# Patient Record
Sex: Male | Born: 1935 | Race: Black or African American | Hispanic: No | Marital: Married | Smoking: Former smoker
Health system: Southern US, Community
[De-identification: ages and names within clinical notes are randomized; demographics above are authoritative.]

## PROBLEM LIST (undated history)

## (undated) DIAGNOSIS — I10 Essential (primary) hypertension: Secondary | ICD-10-CM

## (undated) DIAGNOSIS — E43 Unspecified severe protein-calorie malnutrition: Secondary | ICD-10-CM

## (undated) DIAGNOSIS — R0602 Shortness of breath: Secondary | ICD-10-CM

## (undated) DIAGNOSIS — R001 Bradycardia, unspecified: Secondary | ICD-10-CM

## (undated) DIAGNOSIS — Z87891 Personal history of nicotine dependence: Secondary | ICD-10-CM

## (undated) DIAGNOSIS — I209 Angina pectoris, unspecified: Secondary | ICD-10-CM

## (undated) DIAGNOSIS — Z950 Presence of cardiac pacemaker: Secondary | ICD-10-CM

## (undated) DIAGNOSIS — I509 Heart failure, unspecified: Secondary | ICD-10-CM

## (undated) DIAGNOSIS — I272 Pulmonary hypertension, unspecified: Secondary | ICD-10-CM

## (undated) DIAGNOSIS — I499 Cardiac arrhythmia, unspecified: Secondary | ICD-10-CM

## (undated) HISTORY — PX: CHOLECYSTECTOMY: SHX55

## (undated) HISTORY — PX: JOINT REPLACEMENT: SHX530

## (undated) HISTORY — DX: Personal history of nicotine dependence: Z87.891

## (undated) HISTORY — PX: NECK SURGERY: SHX720

---

## 2010-06-16 HISTORY — PX: PACEMAKER INSERTION: SHX728

## 2011-02-26 ENCOUNTER — Other Ambulatory Visit: Payer: Self-pay | Admitting: Internal Medicine

## 2011-02-27 NOTE — Telephone Encounter (Signed)
RX called in, would not go through electronically  

## 2011-05-25 ENCOUNTER — Emergency Department (INDEPENDENT_AMBULATORY_CARE_PROVIDER_SITE_OTHER): Payer: Medicare Other

## 2011-05-25 ENCOUNTER — Encounter: Payer: Self-pay | Admitting: Emergency Medicine

## 2011-05-25 ENCOUNTER — Other Ambulatory Visit: Payer: Self-pay

## 2011-05-25 ENCOUNTER — Emergency Department (HOSPITAL_BASED_OUTPATIENT_CLINIC_OR_DEPARTMENT_OTHER)
Admission: EM | Admit: 2011-05-25 | Discharge: 2011-05-25 | Discharge status: Against Medical Advice | Payer: Medicare Other | Attending: Emergency Medicine | Admitting: Emergency Medicine

## 2011-05-25 DIAGNOSIS — R079 Chest pain, unspecified: Secondary | ICD-10-CM | POA: Insufficient documentation

## 2011-05-25 DIAGNOSIS — R0602 Shortness of breath: Secondary | ICD-10-CM

## 2011-05-25 DIAGNOSIS — R5381 Other malaise: Secondary | ICD-10-CM | POA: Insufficient documentation

## 2011-05-25 DIAGNOSIS — K7689 Other specified diseases of liver: Secondary | ICD-10-CM

## 2011-05-25 DIAGNOSIS — I2510 Atherosclerotic heart disease of native coronary artery without angina pectoris: Secondary | ICD-10-CM

## 2011-05-25 DIAGNOSIS — J438 Other emphysema: Secondary | ICD-10-CM

## 2011-05-25 DIAGNOSIS — E119 Type 2 diabetes mellitus without complications: Secondary | ICD-10-CM

## 2011-05-25 HISTORY — DX: Essential (primary) hypertension: I10

## 2011-05-25 LAB — BASIC METABOLIC PANEL
BUN: 17 mg/dL (ref 6–23)
CO2: 30 mEq/L (ref 19–32)
Chloride: 104 mEq/L (ref 96–112)
GFR calc non Af Amer: 80 mL/min — ABNORMAL LOW (ref >90)
Glucose, Bld: 87 mg/dL (ref 70–99)
Potassium: 4.6 mEq/L (ref 3.5–5.1)
Sodium: 141 mEq/L (ref 135–145)

## 2011-05-25 LAB — CARDIAC PANEL(CRET KIN+CKTOT+MB+TROPI)
Relative Index: 2 (ref 0.0–2.5)
Total CK: 212 U/L (ref 7–232)
Troponin I: <0.3 ng/mL (ref <0.30)

## 2011-05-25 LAB — CBC
HCT: 37.3 % — ABNORMAL LOW (ref 39.0–52.0)
Hemoglobin: 12.2 g/dL — ABNORMAL LOW (ref 13.0–17.0)
MCHC: 32.7 g/dL (ref 30.0–36.0)
RBC: 4.55 MIL/uL (ref 4.22–5.81)

## 2011-05-25 MED ORDER — ASPIRIN 81 MG PO CHEW
CHEWABLE_TABLET | ORAL | Status: AC
  Administered 2011-05-25: 162 mg
  Filled 2011-05-25: qty 2

## 2011-05-25 MED ORDER — IOHEXOL 350 MG/ML SOLN
80.0000 mL | Freq: Once | INTRAVENOUS | Status: AC | PRN
  Administered 2011-05-25: 80 mL via INTRAVENOUS

## 2011-05-25 MED ORDER — ASPIRIN 325 MG PO TABS
325.0000 mg | ORAL_TABLET | Freq: Once | ORAL | Status: DC
  Filled 2011-05-25: qty 1

## 2011-05-25 NOTE — ED Notes (Signed)
Chest pain x 2 days, with exertional SOB.  Pain non-radiating.  No N/V, no diaphoresis.  Pt states he hasnt had his pacemaker.

## 2011-05-25 NOTE — ED Notes (Signed)
ECG done on arrival and given to EDP for confirmation

## 2011-05-25 NOTE — ED Provider Notes (Signed)
Scribed for Ethelda Chick, MD, the patient was seen in room MH04/MH04 . This chart was scribed by Ellie [name]. This patient's care was started at 7:17 PM.   CSN: [id number] Arrival date & time: 05/25/2011  6:37 PM   First MD Initiated Contact with Patient 05/25/11 1910      Chief Complaint  Patient presents with  . Chest Pain  . Shortness of Breath  . Fatigue    (Consider location/radiation/quality/duration/timing/severity/associated sxs/prior treatment) HPI Frederick [name] is a 77 y.o. male who presents to the Emergency Department complaining of intermittent left chest pain for the past 2 days. Episodes last a few seconds. Pt has many episodes during a day for the past 2 days.  Pain is described as a sharp deep pain not throbbing. Pain does not radiate anywhere. No hx of similar sx. Denies any swelling in legs. Denies history of heart failure. Reports no current chest pain but has had some since coming to ED. Normally does not feel pains in chest.  Additionally Pt says he is SOB on exertion for the past year starting before pacemaker insertion. No recent COPD flare ups.   Does not have a local PCP.  Sees Internal Medicine Dr. Dorene Ar in [place].    Past Medical History  Diagnosis Date  . Hypertension   . Diabetes mellitus   COPD  Past Surgical History  Procedure Date  . Pacemaker insertion 06/2010  . Neck surgery     History reviewed. No pertinent family history.  History  Substance Use Topics  . Smoking status: Not on file  . Smokeless tobacco: Not on file  . Alcohol Use: No    Review of Systems 10 Systems reviewed and are negative for acute change except as noted in the HPI.   Allergies  Review of patient's allergies indicates no known allergies.  Home Medications   Current Outpatient Rx  Name Route Sig Dispense Refill  . ASPIRIN 81 MG PO TABS Oral Take 162 mg by mouth daily.      Marland Kitchen LISINOPRIL 20 MG PO TABS Oral Take 5 mg by mouth daily.      Marland Kitchen VITAMIN  C 500 MG PO TABS Oral Take 500 mg by mouth daily.        BP 173/65  Pulse 65  Temp(Src) 97.6 F (36.4 C) (Oral)  Resp 18  SpO2 99%  Physical Exam  Nursing note and vitals reviewed. Constitutional: He is oriented to person, place, and time. He appears well-developed and well-nourished.  HENT:  Head: Normocephalic and atraumatic.  Eyes: Conjunctivae and EOM are normal.  Neck: Neck supple. No JVD present.  Cardiovascular: Normal rate, regular rhythm and normal heart sounds.   Pulmonary/Chest: He exhibits no tenderness (chest not tender to palpation).       Pacemaker left upper chest wall. No tenderness. No fluctuance.   Abdominal: Soft. There is no tenderness.  Musculoskeletal: Normal range of motion. He exhibits no edema (no peripheral edema).  Neurological: He is alert and oriented to person, place, and time.  Skin: Skin is warm and dry.  Psychiatric: He has a normal mood and affect.   Procedures (including critical care time)  OTHER DATA REVIEWED: Nursing notes, vital signs, and past medical records reviewed.   Date: 05/25/2011  Rate: 69  Rhythm: paced rhythm with occasional PACs  QRS Axis: normal  Intervals: normal  ST/T Wave abnormalities: indeterminate  Conduction Disutrbances:right bundle branch block  Narrative Interpretation:   Old EKG Reviewed: none available  DIAGNOSTIC STUDIES: Oxygen Saturation is 99% on room air, normal by my interpretation.    LABS / RADIOLOGY:  Results for orders placed during the hospital encounter of 05/25/11  CARDIAC PANEL(CRET KIN+CKTOT+MB+TROPI)      Component Value Range   Total CK 212  7 - 232 (U/L)   CK, MB 4.2 (*) 0.3 - 4.0 (ng/mL)   Troponin I <0.30  <0.30 (ng/mL)   Relative Index 2.0  0.0 - 2.5   CBC      Component Value Range   WBC 4.9  4.0 - 10.5 (K/uL)   RBC 4.55  4.22 - 5.81 (MIL/uL)   Hemoglobin 12.2 (*) 13.0 - 17.0 (g/dL)   HCT 16.1 (*) 09.6 - 52.0 (%)   MCV 82.0  78.0 - 100.0 (fL)   MCH 26.8  26.0 - 34.0 (pg)    MCHC 32.7  30.0 - 36.0 (g/dL)   RDW 04.5  40.9 - 81.1 (%)   Platelets 119 (*) 150 - 400 (K/uL)  BASIC METABOLIC PANEL      Component Value Range   Sodium 141  135 - 145 (mEq/L)   Potassium 4.6  3.5 - 5.1 (mEq/L)   Chloride 104  96 - 112 (mEq/L)   CO2 30  19 - 32 (mEq/L)   Glucose, Bld 87  70 - 99 (mg/dL)   BUN 17  6 - 23 (mg/dL)   Creatinine, Ser 9.14  0.50 - 1.35 (mg/dL)   Calcium 9.5  8.4 - 78.2 (mg/dL)   GFR calc non Af Amer 80 (*) >90 (mL/min)   GFR calc Af Amer >90  >90 (mL/min)  D-DIMER, QUANTITATIVE      Component Value Range   D-Dimer, Quant 1.60 (*) 0.00 - 0.48 (ug/mL-FEU)   Dg Chest 2 View  05/25/2011  *RADIOLOGY REPORT*  Clinical Data: Chest pain.  Shortness of breath.  Diabetes.  CHEST - 2 VIEW  Comparison: 05/25/2011 CT scan.  Findings: Emphysema noted along with a dual lead pacer with proximal distal leads project over the right atrium and ventricle, respectively.  No cardiomegaly or edema noted. The lungs appear otherwise clear. No pleural effusion is observed.  Mild thoracic spondylosis noted.  IMPRESSION:  1.  Emphysema. 2.  No acute findings.  Original Report Authenticated By: Dellia Cloud, M.D.   Ct Angio Chest W/cm &/or Wo Cm  05/25/2011  *RADIOLOGY REPORT*  Clinical Data:  Intermittent chest pain.  Chronic shortness of breath.  Elevated D-dimer.  COPD.  Pacemaker placement.  CT ANGIOGRAPHY CHEST WITH CONTRAST  Technique:  Multidetector CT imaging of the chest was performed using the standard protocol during bolus administration of intravenous contrast.  Multiplanar CT image reconstructions including MIPs were obtained to evaluate the vascular anatomy.  Contrast: 80mL OMNIPAQUE IOHEXOL 350 MG/ML IV SOLN  Comparison:  Chest radiograph of 05/25/2011  Findings:  Prominent collateralization of the injection bolus may indicate slight stenosis of the left subclavian vein  No filling defect is identified in the pulmonary arterial tree to suggest pulmonary embolus.   There is evidence of mild left coronary artery atherosclerotic calcification.  No pathologic thoracic adenopathy is observed.  The technologist scanned part of the upper abdomen, revealing a 1.9 cm hypodense lesion in the right hepatic lobe with enhancing margins in a technically nonspecific but abnormal fashion.  Arthropathy of the right sternoclavicular joint is noted.  There is mild biapical pleuroparenchymal scarring.  Emphysema noted  Thoracic spondylosis is present.  Review of the MIP images confirms the  above findings.  IMPRESSION:  1. No filling defect is identified in the pulmonary arterial tree to suggest pulmonary embolus. 2.  Emphysema. 3.  Abnormal lesion in the right hepatic lobe with arterial phase peripheral enhancement is technically nonspecific but warrants further workup. Elective follow-up MRI of the liver with and without contrast is recommended. 4.  Coronary artery atherosclerosis.  Original Report Authenticated By: Dellia Cloud, M.D.    ED COURSE / COORDINATION OF CARE: 7:36 PM New chest pain and history indicates risk factors plan to do a cardiac workup, blood work, ecg, lab work. Also plan to transfer to [place] to admit for chest pain to rule out CHF.   MDM: Pt with intermittent left sided chest pain over the past 2 days.  EKG unremarkable with pacer spikes.  Labs reviewed with patient and wife- elevation in CK-MB- as well as discussed need for further testing to r/o MI.  Pt states he understands, but does not wish to be admitted.  He and wife state they would prefer to go to [place] to see their PMD tomorrow.  Pt is comptetent to make this decision in my opinion- he verbalizes understanding of risks of leaving AMA including worsening condition and even death.    D/w patient that he is welcome to return for treatment if he changes his mind or worsens.    MEDICATIONS GIVEN IN THE E.D.  Medications  aspirin tablet 325 mg    SCRIBE ATTESTATION: I personally  performed the services described in this documentation, which was scribed in my presence. The recorded information has been reviewed and considered.      Ethelda Chick, MD 05/26/11 [phone]

## 2011-05-25 NOTE — ED Notes (Signed)
Pt returned from Ct resting comfortably NAD at present denies pain family at bedside will continue to monitor

## 2011-09-02 ENCOUNTER — Ambulatory Visit: Payer: Self-pay | Admitting: Internal Medicine

## 2011-09-10 ENCOUNTER — Other Ambulatory Visit: Payer: Self-pay | Admitting: Family Medicine

## 2013-03-10 ENCOUNTER — Emergency Department (HOSPITAL_COMMUNITY): Payer: Medicare Other

## 2013-03-10 ENCOUNTER — Encounter (HOSPITAL_COMMUNITY): Payer: Self-pay | Admitting: Emergency Medicine

## 2013-03-10 ENCOUNTER — Emergency Department (HOSPITAL_COMMUNITY)
Admission: EM | Admit: 2013-03-10 | Discharge: 2013-03-10 | Disposition: A | Discharge status: Home / Self Care | Payer: Medicare Other | Attending: Emergency Medicine | Admitting: Emergency Medicine

## 2013-03-10 ENCOUNTER — Telehealth (HOSPITAL_COMMUNITY): Payer: Self-pay

## 2013-03-10 DIAGNOSIS — M542 Cervicalgia: Secondary | ICD-10-CM | POA: Insufficient documentation

## 2013-03-10 DIAGNOSIS — E119 Type 2 diabetes mellitus without complications: Secondary | ICD-10-CM | POA: Insufficient documentation

## 2013-03-10 DIAGNOSIS — Z79899 Other long term (current) drug therapy: Secondary | ICD-10-CM | POA: Insufficient documentation

## 2013-03-10 DIAGNOSIS — Z87891 Personal history of nicotine dependence: Secondary | ICD-10-CM | POA: Insufficient documentation

## 2013-03-10 DIAGNOSIS — I10 Essential (primary) hypertension: Secondary | ICD-10-CM | POA: Insufficient documentation

## 2013-03-10 LAB — CBC
HCT: 32.9 % — ABNORMAL LOW (ref 39.0–52.0)
Hemoglobin: 10.9 g/dL — ABNORMAL LOW (ref 13.0–17.0)
MCH: 27.8 pg (ref 26.0–34.0)
MCV: 83.9 fL (ref 78.0–100.0)
Platelets: 100 10*3/uL — ABNORMAL LOW (ref 150–400)
RBC: 3.92 MIL/uL — ABNORMAL LOW (ref 4.22–5.81)
WBC: 4.4 10*3/uL (ref 4.0–10.5)

## 2013-03-10 LAB — BASIC METABOLIC PANEL
BUN: 21 mg/dL (ref 6–23)
CO2: 28 mEq/L (ref 19–32)
Calcium: 8.6 mg/dL (ref 8.4–10.5)
Chloride: 107 mEq/L (ref 96–112)
Creatinine, Ser: 0.88 mg/dL (ref 0.50–1.35)
Glucose, Bld: 92 mg/dL (ref 70–99)

## 2013-03-10 LAB — POCT I-STAT TROPONIN I: Troponin i, poc: 0.02 ng/mL (ref 0.00–0.08)

## 2013-03-10 MED ORDER — LORAZEPAM 1 MG PO TABS: 0.5000 mg | ORAL_TABLET | Freq: Four times a day (QID) | ORAL | Status: DC | PRN

## 2013-03-10 MED ORDER — HYDROCODONE-ACETAMINOPHEN 5-325 MG PO TABS: 1.0000 | ORAL_TABLET | Freq: Four times a day (QID) | ORAL | Status: DC | PRN

## 2013-03-10 MED ORDER — HYDROCODONE-ACETAMINOPHEN 5-325 MG PO TABS
1.0000 | ORAL_TABLET | Freq: Once | ORAL | Status: AC
  Administered 2013-03-10: 1 via ORAL
  Filled 2013-03-10: qty 1

## 2013-03-10 MED ORDER — LORAZEPAM 1 MG PO TABS
0.5000 mg | ORAL_TABLET | Freq: Once | ORAL | Status: AC
  Administered 2013-03-10: 0.5 mg via ORAL
  Filled 2013-03-10: qty 1

## 2013-03-10 NOTE — ED Notes (Signed)
Pt's wife stating pt sleepy but AVS she has showing pt didn't receive any meds while in ED.  Chart reviewed and I show pt rcvd 1 vicodin 5/325 mg and 0.5 mg Ativan.  Both medications are listed on AVS that I can view at present.  Pt's wife directed to [facility].

## 2013-03-10 NOTE — ED Notes (Signed)
Old and New EKG given to Dr Bednar 

## 2013-03-10 NOTE — ED Notes (Signed)
Pt brought in by EMS. Pt has been having chest pain intermittently x 2 days. Pt denies nausea, vomiting and SOB. Per EMS, pt is no longer having chest pain, but is having persistent jaw and neck pain. Pt had a pacemaker placed in "2012", but he is not sure. EMS EKG's show a right bundle branch block. Pt received 1 NTG and 324 of aspirin per EMS.

## 2013-03-10 NOTE — ED Provider Notes (Signed)
CSN: [id number]     Arrival date & time 03/10/13  0232 History     First MD Initiated Contact with Patient 03/10/13 [phone]     Chief Complaint  Patient presents with  . Chest Pain   (Consider location/radiation/quality/duration/timing/severity/associated sxs/prior Treatment) HPI Gradual onset constant 3 days left paracervical posterior neck pain without midline pain without right-sided pain without radiation or associated symptoms over the last few days he has also had a few spells a day lasting just a few seconds of a vague chest discomfort which is not present now, reproducible left paracervical tenderness distal NVI, baseline neuropathy in feet, baseline chronic shortness of breath with exertion unchanged. No fever no cough no shortness breath no abdominal pain no vomiting no focal weakness or numbness no extremity pain no change in bowel or bladder function. Past Medical History  Diagnosis Date  . Hypertension   . Diabetes mellitus    Past Surgical History  Procedure Laterality Date  . Pacemaker insertion    . Neck surgery    . Joint replacement      bilateral hip  . Cholecystectomy     History reviewed. No pertinent family history. History  Substance Use Topics  . Smoking status: Former Games developer  . Smokeless tobacco: Not on file  . Alcohol Use: No    Review of Systems 10 Systems reviewed and are negative for acute change except as noted in the HPI. Allergies  Review of patient's allergies indicates no known allergies.  Home Medications   Current Outpatient Rx  Name  Route  Sig  Dispense  Refill  . aspirin 325 MG tablet   Oral   Take 325 mg by mouth every 6 (six) hours as needed for pain.         Marland Kitchen lisinopril (PRINIVIL,ZESTRIL) 20 MG tablet   Oral   Take 20 mg by mouth daily.          Marland Kitchen HYDROcodone-acetaminophen (NORCO) 5-325 MG per tablet   Oral   Take 1 tablet by mouth every 6 (six) hours as needed for pain.   10 tablet   0   . LORazepam (ATIVAN) 1 MG  tablet   Oral   Take 0.5 tablets (0.5 mg total) by mouth every 6 (six) hours as needed (spasms).   10 tablet   0    BP 170/62  Pulse 66  Temp(Src) 98 F (36.7 C) (Oral)  Resp 18  SpO2 98% Physical Exam  Nursing note and vitals reviewed. Constitutional:  Awake, alert, nontoxic appearance with baseline speech for patient.  HENT:  Head: Atraumatic.  Mouth/Throat: No oropharyngeal exudate.  Eyes: EOM are normal. Pupils are equal, round, and reactive to light. Right eye exhibits no discharge. Left eye exhibits no discharge.  Neck: Neck supple.  Cervical spine nontender, reproducible left paracervical soft tissue tenderness without erythema induration or fluctuance or rash  Cardiovascular: Normal rate and regular rhythm.   No murmur heard. Pulmonary/Chest: Effort normal and breath sounds normal. No stridor. No respiratory distress. He has no wheezes. He has no rales. He exhibits no tenderness.  Abdominal: Soft. Bowel sounds are normal. He exhibits no mass. There is no tenderness. There is no rebound.  Musculoskeletal: He exhibits no tenderness.  Baseline ROM, moves extremities with no obvious new focal weakness.  Lymphadenopathy:    He has no cervical adenopathy.  Neurological: He is alert.  Awake, alert, cooperative and aware of situation; motor strength bilaterally; sensation normal to light touch bilaterally; peripheral visual  fields full to confrontation; no facial asymmetry; tongue midline; major cranial nerves appear intact; no pronator drift, normal finger to nose bilaterally  Skin: No rash noted.  Psychiatric: He has a normal mood and affect.    ED Course  ECG: Sinus rhythm, occasional PVCs, ventricular rate 72, normal axis, right bundle branch block, inferior T wave inversions, no significant change compared with October 2012 Procedures (including critical care time)  Labs Reviewed  CBC - Abnormal; Notable for the following:    RBC 3.92 (*)    Hemoglobin 10.9 (*)     HCT 32.9 (*)    Platelets 100 (*)    All other components within normal limits  BASIC METABOLIC PANEL - Abnormal; Notable for the following:    GFR calc non Af Amer 80 (*)    All other components within normal limits  POCT I-STAT TROPONIN I   Dg Chest Port 1 View  03/10/2013   *RADIOLOGY REPORT*  Clinical Data: Chest pain.  PORTABLE CHEST - 1 VIEW  Comparison: PA and lateral chest and CT chest 05/25/2011.  Findings: The lungs are emphysematous but clear.  Heart size is upper normal.  No pneumothorax or pleural effusion.  Pacing device noted.  IMPRESSION: Emphysema without acute disease.   Original Report Authenticated By: Holley Dexter, M.D.   1. Neck pain on left side     MDM  I doubt any other [facility] precluding discharge at this time including, but not necessarily limited to the following:cervical radiculopathy, ACS.  Hurman Horn, MD 03/10/13 [phone]

## 2013-03-10 NOTE — ED Notes (Addendum)
0420  Pt is relaxing in the bed at this time with no complaints noted. Will continue to monitor the pt  0540  Pt is relaxing with no other complaints at this time.  Pain is still gone.

## 2013-04-04 ENCOUNTER — Emergency Department (HOSPITAL_BASED_OUTPATIENT_CLINIC_OR_DEPARTMENT_OTHER)
Admission: EM | Admit: 2013-04-04 | Discharge: 2013-04-04 | Discharge status: Against Medical Advice | Payer: Medicare Other | Attending: Emergency Medicine | Admitting: Emergency Medicine

## 2013-04-04 ENCOUNTER — Ambulatory Visit (HOSPITAL_BASED_OUTPATIENT_CLINIC_OR_DEPARTMENT_OTHER): Admission: RE | Admit: 2013-04-04 | Payer: Medicare Other | Source: Ambulatory Visit

## 2013-04-04 ENCOUNTER — Emergency Department (HOSPITAL_BASED_OUTPATIENT_CLINIC_OR_DEPARTMENT_OTHER): Payer: Medicare Other

## 2013-04-04 ENCOUNTER — Ambulatory Visit (HOSPITAL_BASED_OUTPATIENT_CLINIC_OR_DEPARTMENT_OTHER): Payer: Medicare Other

## 2013-04-04 ENCOUNTER — Encounter (HOSPITAL_BASED_OUTPATIENT_CLINIC_OR_DEPARTMENT_OTHER): Payer: Self-pay | Admitting: Emergency Medicine

## 2013-04-04 DIAGNOSIS — R296 Repeated falls: Secondary | ICD-10-CM | POA: Insufficient documentation

## 2013-04-04 DIAGNOSIS — Z9181 History of falling: Secondary | ICD-10-CM | POA: Insufficient documentation

## 2013-04-04 DIAGNOSIS — Z87891 Personal history of nicotine dependence: Secondary | ICD-10-CM | POA: Insufficient documentation

## 2013-04-04 DIAGNOSIS — E119 Type 2 diabetes mellitus without complications: Secondary | ICD-10-CM | POA: Insufficient documentation

## 2013-04-04 DIAGNOSIS — Z950 Presence of cardiac pacemaker: Secondary | ICD-10-CM | POA: Insufficient documentation

## 2013-04-04 DIAGNOSIS — R0789 Other chest pain: Secondary | ICD-10-CM | POA: Insufficient documentation

## 2013-04-04 DIAGNOSIS — M25552 Pain in left hip: Secondary | ICD-10-CM

## 2013-04-04 DIAGNOSIS — I10 Essential (primary) hypertension: Secondary | ICD-10-CM | POA: Insufficient documentation

## 2013-04-04 DIAGNOSIS — Z7982 Long term (current) use of aspirin: Secondary | ICD-10-CM | POA: Insufficient documentation

## 2013-04-04 DIAGNOSIS — Z79899 Other long term (current) drug therapy: Secondary | ICD-10-CM | POA: Insufficient documentation

## 2013-04-04 DIAGNOSIS — W19XXXA Unspecified fall, initial encounter: Secondary | ICD-10-CM

## 2013-04-04 DIAGNOSIS — R0989 Other specified symptoms and signs involving the circulatory and respiratory systems: Secondary | ICD-10-CM | POA: Insufficient documentation

## 2013-04-04 DIAGNOSIS — R0600 Dyspnea, unspecified: Secondary | ICD-10-CM

## 2013-04-04 DIAGNOSIS — Y9389 Activity, other specified: Secondary | ICD-10-CM | POA: Insufficient documentation

## 2013-04-04 DIAGNOSIS — R079 Chest pain, unspecified: Secondary | ICD-10-CM

## 2013-04-04 DIAGNOSIS — R609 Edema, unspecified: Secondary | ICD-10-CM

## 2013-04-04 DIAGNOSIS — Y929 Unspecified place or not applicable: Secondary | ICD-10-CM | POA: Insufficient documentation

## 2013-04-04 DIAGNOSIS — R0609 Other forms of dyspnea: Secondary | ICD-10-CM | POA: Insufficient documentation

## 2013-04-04 DIAGNOSIS — S79919A Unspecified injury of unspecified hip, initial encounter: Secondary | ICD-10-CM | POA: Insufficient documentation

## 2013-04-04 DIAGNOSIS — S0990XA Unspecified injury of head, initial encounter: Secondary | ICD-10-CM | POA: Insufficient documentation

## 2013-04-04 NOTE — ED Notes (Signed)
Patient's family expressed that the caregiver misrepresented herself as an MD when she was a DO, physician spoke with family and they still insisted that they leave and did not sign a discharge form. Family stated that the patient was seen recently at [facility] ER and was not provided with medicine. Assisted patient to vehicle.

## 2013-04-04 NOTE — ED Notes (Signed)
Patient's family expressed that that wanted to go to another facility and stated that we were not busy and were not doing anything. Informed family of procedures to be performed and MD will discuss plan with family. Informed family that we will work in every way possible to provide treatment ant going to another facily will delay care.

## 2013-04-04 NOTE — ED Notes (Addendum)
Has fallen 3 times in last week, states legs are weak and collapse, blood sugars running high, edema in legs bilaterally, intermittent pain in chest with movement

## 2013-04-04 NOTE — ED Provider Notes (Signed)
This chart was scribed for Layla Maw Ward, DO by Leone Payor, ED Scribe. This patient was seen in room MH03/MH03 and the patient's care was started 5:44 PM.  TIME SEEN: 6:00 PM   CHIEF COMPLAINT: Fall  HPI: HPI Comments: Frederick [name] is a 79 y.o. male with a history of pacemaker, hypertension, diabetes who presents to the Emergency Department complaining of a fall that occurred 3 days ago. He had another fall yesterday and sustained a head injury and left hip injury. He is also having increased bilateral leg swelling that began 4 days ago. Family states pt was having chest pain and SOB which is worsened with deep breathing and twisting. He is also having increased blood sugar but has not taken DM medication in the last 6-7 years because it was under control. Pt had a pacemaker placed 2 years ago and family is concerned there may be something wrong with it. He denies that he has had any fever, cough, vomiting or diarrhea. No numbness, tingling or focal weakness. He's not on any anticoagulation.  ROS: See HPI Constitutional: no fever  Eyes: no drainage  ENT: no runny nose   Cardiovascular:   chest pain  Resp: SOB  GI: no vomiting GU: no dysuria Integumentary: no rash  Allergy: no hives  Musculoskeletal: Bilateral leg swelling  Neurological: no slurred speech ROS otherwise negative  PAST MEDICAL HISTORY/PAST SURGICAL HISTORY:  Past Medical History  Diagnosis Date  . Hypertension   . Diabetes mellitus     MEDICATIONS:  Prior to Admission medications   Medication Sig Start Date End Date Taking? Authorizing Provider  aspirin 325 MG tablet Take 325 mg by mouth every 6 (six) hours as needed for pain.    Historical Provider, MD  HYDROcodone-acetaminophen (NORCO) 5-325 MG per tablet Take 1 tablet by mouth every 6 (six) hours as needed for pain. 03/10/13   Hurman Horn, MD  lisinopril (PRINIVIL,ZESTRIL) 20 MG tablet Take 20 mg by mouth daily.     Historical Provider, MD  LORazepam (ATIVAN) 1  MG tablet Take 0.5 tablets (0.5 mg total) by mouth every 6 (six) hours as needed (spasms). 03/10/13   Hurman Horn, MD    ALLERGIES:  No Known Allergies  SOCIAL HISTORY:  History  Substance Use Topics  . Smoking status: Former Games developer  . Smokeless tobacco: Not on file  . Alcohol Use: No    FAMILY HISTORY: History reviewed. No pertinent family history.  EXAM: BP 122/49  Pulse 75  Temp(Src) 98.2 F (36.8 C)  SpO2 98% CONSTITUTIONAL: Alert and oriented and responds appropriately to questions. Well-appearing; well-nourished; GCS 15 HEAD: Normocephalic; atraumatic EYES: Conjunctivae clear, PERRL, EOMI ENT: normal nose; no rhinorrhea; moist mucous membranes; pharynx without lesions noted; no dental injury; no hemotypanum; no septal hematoma NECK: Supple, no meningismus, no LAD; no midline spinal tenderness, step-off or deformity CARD: RRR; S1 and S2 appreciated; no murmurs, no clicks, no rubs, no gallops RESP: Normal chest excursion without splinting or tachypnea; breath sounds clear and equal bilaterally; no wheezes, no rhonchi, no rales; chest wall stable, nontender to palpation ABD/GI: Normal bowel sounds; non-distended; soft, non-tender, no rebound, no guarding PELVIS:  stable, nontender to palpation BACK:  The back appears normal and is non-tender to palpation, there is no CVA tenderness; no midline spinal tenderness, step-off or deformity EXT: Normal ROM in all joints; non-tender to palpation; no edema; normal capillary refill; no cyanosis. 3+ pitting edema in the BLE from knees distally.  SKIN: Normal color  for age and race; warm NEURO: Moves all extremities equally, sensation to light touch intact diffusely, cranial nerves II through XII intact PSYCH: The patient's mood and manner are appropriate. Grooming and personal hygiene are appropriate.  MEDICAL DECISION MAKING: Patient today with multiple complaints. Family is concerned as he's had several falls in the last week due to  generalized weakness. He sustained a head injury in the left hip injury. They're also concerned as he's been complaining of chest pain shortness of breath and has had several days of swelling in bilateral lower extremities. Pelvis report his blood sugars been running high although his primary care physician advised him to stop taking his diabetic medications. Have advised family that I recommend admission to the hospital. They report that they do not want to go back to [facility] or worsen long as it had poor experiences in the past. Family reports they may want to take him home AGAINST MEDICAL ADVICE to followup in [place] where his daughters are from. They agreed to stay however for labs, urine, EKG, imaging.  ED PROGRESS: Family has decided to leave the emergency department AGAINST MEDICAL ADVICE. They do not want to wait for any further testing today. They state they will followup with their primary care physician. Patient is oriented x3 and capable of making this decision. He is nontoxic he did. He is able to verbalize the risk of leaving the hospital back to me. They understand that he may have life-threatening illness that we have not diagnosed. They understand there's risk of death with leaving the hospital. Have given return precautions. Has PCP for followup.  Layla Maw Ward, DO 04/05/13 [phone]

## 2013-04-13 ENCOUNTER — Emergency Department (HOSPITAL_COMMUNITY): Payer: Medicare Other

## 2013-04-13 ENCOUNTER — Encounter (HOSPITAL_COMMUNITY): Payer: Self-pay | Admitting: Emergency Medicine

## 2013-04-13 ENCOUNTER — Inpatient Hospital Stay (HOSPITAL_COMMUNITY)
Admission: EM | Admit: 2013-04-13 | Discharge: 2013-04-21 | DRG: 287 | Disposition: A | Discharge status: Home / Self Care | Payer: Medicare Other | Attending: Cardiovascular Disease | Admitting: Cardiovascular Disease

## 2013-04-13 DIAGNOSIS — Z9181 History of falling: Secondary | ICD-10-CM

## 2013-04-13 DIAGNOSIS — I5041 Acute combined systolic (congestive) and diastolic (congestive) heart failure: Principal | ICD-10-CM | POA: Diagnosis present

## 2013-04-13 DIAGNOSIS — I252 Old myocardial infarction: Secondary | ICD-10-CM

## 2013-04-13 DIAGNOSIS — E119 Type 2 diabetes mellitus without complications: Secondary | ICD-10-CM | POA: Diagnosis present

## 2013-04-13 DIAGNOSIS — I10 Essential (primary) hypertension: Secondary | ICD-10-CM

## 2013-04-13 DIAGNOSIS — IMO0002 Reserved for concepts with insufficient information to code with codable children: Secondary | ICD-10-CM

## 2013-04-13 DIAGNOSIS — I420 Dilated cardiomyopathy: Secondary | ICD-10-CM | POA: Diagnosis present

## 2013-04-13 DIAGNOSIS — I2789 Other specified pulmonary heart diseases: Secondary | ICD-10-CM | POA: Diagnosis present

## 2013-04-13 DIAGNOSIS — I272 Pulmonary hypertension, unspecified: Secondary | ICD-10-CM

## 2013-04-13 DIAGNOSIS — Z79899 Other long term (current) drug therapy: Secondary | ICD-10-CM

## 2013-04-13 DIAGNOSIS — I2589 Other forms of chronic ischemic heart disease: Secondary | ICD-10-CM | POA: Diagnosis present

## 2013-04-13 DIAGNOSIS — I509 Heart failure, unspecified: Secondary | ICD-10-CM

## 2013-04-13 DIAGNOSIS — R634 Abnormal weight loss: Secondary | ICD-10-CM | POA: Diagnosis present

## 2013-04-13 DIAGNOSIS — Z950 Presence of cardiac pacemaker: Secondary | ICD-10-CM

## 2013-04-13 DIAGNOSIS — Z96649 Presence of unspecified artificial hip joint: Secondary | ICD-10-CM

## 2013-04-13 DIAGNOSIS — Z87891 Personal history of nicotine dependence: Secondary | ICD-10-CM

## 2013-04-13 HISTORY — DX: Bradycardia, unspecified: R00.1

## 2013-04-13 HISTORY — DX: Shortness of breath: R06.02

## 2013-04-13 HISTORY — DX: Pulmonary hypertension, unspecified: I27.20

## 2013-04-13 HISTORY — DX: Cardiac arrhythmia, unspecified: I49.9

## 2013-04-13 HISTORY — DX: Angina pectoris, unspecified: I20.9

## 2013-04-13 HISTORY — DX: Heart failure, unspecified: I50.9

## 2013-04-13 HISTORY — DX: Presence of cardiac pacemaker: Z95.0

## 2013-04-13 LAB — BASIC METABOLIC PANEL
BUN: 15 mg/dL (ref 6–23)
Calcium: 8.7 mg/dL (ref 8.4–10.5)
Chloride: 103 mEq/L (ref 96–112)
Creatinine, Ser: 1 mg/dL (ref 0.50–1.35)
GFR calc Af Amer: 80 mL/min — ABNORMAL LOW (ref >90)
GFR calc non Af Amer: 69 mL/min — ABNORMAL LOW (ref >90)
Glucose, Bld: 175 mg/dL — ABNORMAL HIGH (ref 70–99)
Sodium: 139 mEq/L (ref 135–145)

## 2013-04-13 LAB — CREATININE, SERUM: Creatinine, Ser: 0.98 mg/dL (ref 0.50–1.35)

## 2013-04-13 LAB — CBC
Hemoglobin: 10.2 g/dL — ABNORMAL LOW (ref 13.0–17.0)
Platelets: 270 10*3/uL (ref 150–400)
RBC: 3.74 MIL/uL — ABNORMAL LOW (ref 4.22–5.81)
WBC: 6.8 10*3/uL (ref 4.0–10.5)

## 2013-04-13 LAB — TROPONIN I: Troponin I: <0.3 ng/mL (ref <0.30)

## 2013-04-13 MED ORDER — ACETAMINOPHEN 325 MG PO TABS
650.0000 mg | ORAL_TABLET | ORAL | Status: DC | PRN
  Administered 2013-04-17: 650 mg via ORAL
  Filled 2013-04-13: qty 2

## 2013-04-13 MED ORDER — HEPARIN SODIUM (PORCINE) 5000 UNIT/ML IJ SOLN
5000.0000 [IU] | Freq: Three times a day (TID) | INTRAMUSCULAR | Status: DC
  Administered 2013-04-13 – 2013-04-21 (×22): 5000 [IU] via SUBCUTANEOUS
  Filled 2013-04-13 (×26): qty 1

## 2013-04-13 MED ORDER — SODIUM CHLORIDE 0.9 % IV SOLN: 250.0000 mL | INTRAVENOUS | Status: DC | PRN

## 2013-04-13 MED ORDER — SODIUM CHLORIDE 0.9 % IJ SOLN: 3.0000 mL | INTRAMUSCULAR | Status: DC | PRN

## 2013-04-13 MED ORDER — FUROSEMIDE 10 MG/ML IJ SOLN
40.0000 mg | Freq: Two times a day (BID) | INTRAMUSCULAR | Status: DC
  Administered 2013-04-13 – 2013-04-16 (×7): 40 mg via INTRAVENOUS
  Filled 2013-04-13 (×11): qty 4

## 2013-04-13 MED ORDER — ASPIRIN EC 81 MG PO TBEC
81.0000 mg | DELAYED_RELEASE_TABLET | Freq: Every day | ORAL | Status: DC
  Administered 2013-04-13 – 2013-04-18 (×6): 81 mg via ORAL
  Filled 2013-04-13 (×7): qty 1

## 2013-04-13 MED ORDER — SODIUM CHLORIDE 0.9 % IJ SOLN
3.0000 mL | Freq: Two times a day (BID) | INTRAMUSCULAR | Status: DC
  Administered 2013-04-13 – 2013-04-21 (×15): 3 mL via INTRAVENOUS

## 2013-04-13 MED ORDER — ONDANSETRON HCL 4 MG/2ML IJ SOLN: 4.0000 mg | Freq: Four times a day (QID) | INTRAMUSCULAR | Status: DC | PRN

## 2013-04-13 MED ORDER — LISINOPRIL 20 MG PO TABS
20.0000 mg | ORAL_TABLET | Freq: Every day | ORAL | Status: DC
  Administered 2013-04-13 – 2013-04-21 (×9): 20 mg via ORAL
  Filled 2013-04-13 (×9): qty 1

## 2013-04-13 MED ORDER — ALPRAZOLAM 0.25 MG PO TABS
0.2500 mg | ORAL_TABLET | Freq: Two times a day (BID) | ORAL | Status: DC | PRN
  Administered 2013-04-13: 0.25 mg via ORAL
  Filled 2013-04-13: qty 1

## 2013-04-13 MED ORDER — ZOLPIDEM TARTRATE 5 MG PO TABS: 5.0000 mg | ORAL_TABLET | Freq: Every evening | ORAL | Status: DC | PRN

## 2013-04-13 NOTE — Consult Note (Signed)
Family Medicine Teaching Service Consult Note History and Physical Service Pager: [phone]  Patient name: Frederick [name] Medical record number: [id number] Date of birth: [birth year] Age: 79 y.o. Gender: male  Primary Care Provider: No PCP Per Patient, Prev PCP in [place] Consultants: Cardiology, vascular Code Status: Full  Chief Complaint: Lle, angina  History of Present Illness: Frederick [name] is a 79 y.o. male presenting with swelling, SOB X4 weeks. Has chest pain described as sharp, with occassional radiation into back. worse when moving in certain positions and when going to the bathroom. Can only ambulate a few feet before having SOB. Rest makes it better. Swelling started around the same time. Patient first noticed in feet and progressively moved up to belly. Sleeps on 2 pillows at night.   Of note medications take at home include asa and lisinopril only. Is not daily compliant with meds. Takes hydrocodone for chest pain and hip pain. Has fallen a few times in the house. Is also s/p bilat hip replacement.    Was brought to the ED via ambulance for SOB.Was not given ntg per EMS. In the ED patient given 40mg  IV lasix. ChestXR with pleural effusion, Trop neg X1.   Review Of Systems: Per HPI with the following additions: Melena (dark tarry stools), 50 lb weight loss unintentional, increased falls  Otherwise 12 point review of systems was performed and was unremarkable.  Patient Active Problem List   Diagnosis Date Noted  . CHF (congestive heart failure) 04/13/2013  . HTN (hypertension) 04/13/2013  . DM (diabetes mellitus) 04/13/2013  . Cardiac pacemaker in situ 04/13/2013   Past Medical History: Past Medical History  Diagnosis Date  . Hypertension   . Diabetes mellitus   COPD  Past Surgical History: Past Surgical History  Procedure Laterality Date  . Pacemaker insertion    . Neck surgery    . Joint replacement      bilateral hip  . Cholecystectomy     Social  History: History  Substance Use Topics  . Smoking status: Former Games developer  . Smokeless tobacco: Not on file  . Alcohol Use: No   Additional social history: Quit approx 40 years ago  Please also refer to relevant sections of EMR.Previously was truck Hospital doctor. Lives at home with wife.  Family History: Family History  Problem Relation Age of Onset  . Heart failure Mother    Allergies and Medications: No Known Allergies No current facility-administered medications on file prior to encounter.   Current Outpatient Prescriptions on File Prior to Encounter  Medication Sig Dispense Refill  . aspirin 325 MG tablet Take 325 mg by mouth every 6 (six) hours as needed for pain.      Marland Kitchen HYDROcodone-acetaminophen (NORCO) 5-325 MG per tablet Take 1 tablet by mouth every 6 (six) hours as needed for pain.  10 tablet  0  . lisinopril (PRINIVIL,ZESTRIL) 20 MG tablet Take 20 mg by mouth daily.         Objective: BP 156/50  Pulse 82  Temp(Src) 98.2 F (36.8 C) (Oral)  Resp 18  SpO2 99% Exam: General: NAD, sitting quietly in bed HEENT: NCAT Cardiovascular: RRR, distant heart sounds, unable to appreciate DPs, faint popliteal bilat, femoral pulse strong Respiratory: Decreased lung sounds in the bilat bases, no increased WOB, no crackles or rhonchi Abdomen: S/NT/ND Extremities: bilat peripheral pitting doughy edema +3 up to mid thigh, chronic overlying skin changes consistent with venous stasis Skin: See above, warm dry  Neuro: A&O X4, non focal  Labs and  Imaging: CBC BMET   Recent Labs Lab 04/13/13 1410  WBC 6.8  HGB 10.2*  HCT 30.8*  PLT 270    Recent Labs Lab 04/13/13 1410  NA 139  K 4.4  CL 103  CO2 29  BUN 15  CREATININE 1.00  GLUCOSE 175*  CALCIUM 8.7     ProBNP 24510 H/H 10.2/30.8 CBG 175  Assessment and Plan: Frederick [name] is a 79 y.o. male presenting with lle swelling and angina on exertion X2-3 days. 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   Agree with Cardiology team w/up including serial trops, repeat EKG, ECHO, IV lasix therapy, lipid panel, CMET, U/A Continue ASA, will most likely need high dose statin given co-morbidities, Restart ACE-I, will need BB per HF guidelines, pacemaker interogation  Recommend f/up 50lb weight loss with hemoccult at minimum given hx of melena Happy to see in [facility] at D/c or can est care with Wellness center  Disposition: admit to cards, management per primary team  Anselm Lis, MD 04/13/2013, 5:01 PM PGY-1, [facility] Family Medicine FPTS Intern pager: [phone], text pages welcome   R2 Addendum:  I have seen the above patient and have discussed the patient's presentation, history, objective data, physical exam and assessment and plan with Dr. Michail Jewels.  Briefly, this patient is a 79 y.o. year old male who presents following a four-week overall decline in his functional status.  Reports dyspnea at rest but significantly worsened with any type of exertion.  Reports a significant amount of swelling.  Interestingly he reports a 50 pound weight loss over the past 6-12 months in spite of his additional fluid accumulation.  After discussing with the primary cardiology team they have elected to admit the patient and do not require a hospitalist consultant.  We will defer any further evaluation and treatment to the cardiology team.  We do recommend that he establish a primary care physician in [place] and recommend that he be seen in the community wellness Center or in our clinic.  He will need followup regarding his  reported 50 pound weight loss.  Of note he has not had a screening colonoscopy in quite some time and this is at the top of the differential for needing to be further evaluated.    Andrena Mews, DO [facility] Resident - PGY-3 04/13/2013 8:51 PM

## 2013-04-13 NOTE — ED Notes (Signed)
Case worker in to speak to pt.  Dr. Loretha Stapler in to explain plan of care.  Pt resting quietly at this time.

## 2013-04-13 NOTE — Progress Notes (Signed)
Patient requested something to help him sleep.  Xanax given x1.  RN will continue to monitor. Louretta Parma, RN

## 2013-04-13 NOTE — ED Notes (Signed)
Pt brought to ED by EMS with bilateral edema of the lower limbs.As per pt he has leg edema since 2 weeks and SOB on walking.

## 2013-04-13 NOTE — ED Provider Notes (Signed)
CSN: [id number]     Arrival date & time 04/13/13  1216 History   First MD Initiated Contact with Patient 04/13/13 1250     Chief Complaint  Patient presents with  . Leg Swelling   (Consider location/radiation/quality/duration/timing/severity/associated sxs/prior Treatment) The history is provided by the patient and medical records. No language interpreter was used.     Frederick [name] is a 79 year old male with a past medical history of diabetes, hypertension, previous pacemaker insertion in placed approximately 2 years ago in [place] he presents today with chief complaint of leg swelling.  The patient has had 4-5 days of new-onset bilateral pitting peripheral edema.  ECT is never had this before.  He also complains of dyspnea on exertion with associated chest pain which is relieved by rest.  The patient denies orthopnea or PND.  He denies a history of heart failure.  He denies a history of kidney problems. Patient states a month ago he would've been able to walk the entire grocery store.  Over the fat past 4 or 5 days he is unable to walk down his hall without having to stop and sit down to catch his breath.  The patient has not had any primary care or cardiac followup in the past 2 years since he's been here in [place]. Denies fevers, chills, myalgias, arthralgias. Denies dysuria, flank pain, suprapubic pain, frequency, urgency, or hematuria. Denies headaches, light headedness, weakness, visual disturbances. Denies abdominal pain, nausea, vomiting, diarrhea or constipation.   Past Medical History  Diagnosis Date  . Hypertension   . Diabetes mellitus    Past Surgical History  Procedure Laterality Date  . Pacemaker insertion    . Neck surgery    . Joint replacement      bilateral hip  . Cholecystectomy     No family history on file. History  Substance Use Topics  . Smoking status: Former Games developer  . Smokeless tobacco: Not on file  . Alcohol Use: No    Review of Systems  Constitutional:  Negative for fever and chills.  Respiratory: Positive for shortness of breath. Negative for apnea, cough, choking and wheezing.   Cardiovascular: Positive for chest pain and leg swelling.  Gastrointestinal: Negative for vomiting, abdominal pain, diarrhea and constipation.  Genitourinary: Negative for dysuria, urgency and frequency.  Musculoskeletal: Negative for myalgias and arthralgias.  Skin: Negative for rash.  Neurological: Negative for headaches.    Allergies  Review of patient's allergies indicates no known allergies.  Home Medications   Current Outpatient Rx  Name  Route  Sig  Dispense  Refill  . aspirin 325 MG tablet   Oral   Take 325 mg by mouth every 6 (six) hours as needed for pain.         Marland Kitchen HYDROcodone-acetaminophen (NORCO) 5-325 MG per tablet   Oral   Take 1 tablet by mouth every 6 (six) hours as needed for pain.   10 tablet   0   . lisinopril (PRINIVIL,ZESTRIL) 20 MG tablet   Oral   Take 20 mg by mouth daily.           BP 156/50  Pulse 82  Temp(Src) 98.2 F (36.8 C) (Oral)  Resp 18  SpO2 99% Physical Exam  Nursing note and vitals reviewed. Constitutional: He appears well-developed and well-nourished. No distress.  HENT:  Head: Normocephalic and atraumatic.  Eyes: Conjunctivae are normal. No scleral icterus.  Neck: Normal range of motion. Neck supple. No JVD present.  Cardiovascular: Normal rate, regular rhythm and  normal heart sounds.   Bilateral peripheral pitting edema all the way up to the thigh.  No heat, redness.  Pulmonary/Chest: Effort normal and breath sounds normal. No respiratory distress.  Abdominal: Soft. There is no tenderness.  Musculoskeletal: He exhibits no edema.  Neurological: He is alert.  Skin: Skin is warm and dry. He is not diaphoretic.  Psychiatric: His behavior is normal.    ED Course  Procedures (including critical care time) Labs Review Labs Reviewed  PRO B NATRIURETIC PEPTIDE - Abnormal; Notable for the  following:    Pro B Natriuretic peptide (BNP) 24510.0 (*)    All other components within normal limits  CBC - Abnormal; Notable for the following:    RBC 3.74 (*)    Hemoglobin 10.2 (*)    HCT 30.8 (*)    All other components within normal limits  BASIC METABOLIC PANEL - Abnormal; Notable for the following:    Glucose, Bld 175 (*)    GFR calc non Af Amer 69 (*)    GFR calc Af Amer 80 (*)    All other components within normal limits  POCT I-STAT TROPONIN I   Imaging Review Dg Chest 2 View  04/13/2013   *RADIOLOGY REPORT*  Clinical Data: Shortness of breath, leg swelling  CHEST - 2 VIEW  Comparison: 03/10/2013  Findings: Chronic interstitial markings/emphysematous changes.  No focal consolidation.  Suspected small right pleural effusion.  No pneumothorax.  The heart is normal in size.  Left subclavian pacemaker.  Degenerative changes of the visualized thoracolumbar spine.  Cholecystectomy clips.  IMPRESSION: Small right pleural effusion.  Otherwise, no evidence of acute cardiopulmonary disease.   Original Report Authenticated By: Charline Bills, M.D.    MDM   1. CHF (congestive heart failure)   2. HTN (hypertension)   3. DM (diabetes mellitus)   4. Cardiac pacemaker in situ    Patient here with bilateral peripheral edema.  Possibly has new onset heart failure.  We'll check the patient's kidney function considering his history of diabetes hypertension which has been untreated for the past 2 years.    The patient with Pro BNP of 24,510.  His chest x-ray shows a possible small right pleural effusion without evidence of pulmonary edema.  Patient's EKG does show a paced rhythm with a right bundle branch block.  I placed a consult to cardiology he will see the patient here in the ED.  The patient will be admitted by internal medicine.  He's been given 40 mg IV Lasix.     Arthor Captain, PA-C 04/14/13 2157

## 2013-04-13 NOTE — Consult Note (Signed)
Reason for Consult: SOB Referring Physician: [facility] ER Physician   HPI: The patient is a 79 y/o AAM male, with a history of PPM insertion 2-3 years ago for bradycardia, HTN and diabetes. He moved from [place] to [place] 7 years ago and has not established care with a PCP. His PPM is a [employer] device that was inserted at [facility], however he has not maintained follow-up since it was placed. He denies any past history of MI or CHF. He presented to the [facility] ER today with a complaint on worsening bilateral LEE and weight gain x 1 week. He notes increased resting shortness of breath and DOE. He denies orthopnea/PND. He has had mild intermittent mid-epigastric/ substernal chest pain, described as a sharp, non radiating pain. He reports compliance with his blood pressure medication but states that he has been eating a lot of high sodium foods.    Past Medical History  Diagnosis Date  . Hypertension   . Diabetes mellitus     Past Surgical History  Procedure Laterality Date  . Pacemaker insertion    . Neck surgery    . Joint replacement      bilateral hip  . Cholecystectomy      Family History  Problem Relation Age of Onset  . Heart failure Mother     Social History:  reports that he has quit smoking. He does not have any smokeless tobacco history on file. He reports that he does not drink alcohol or use illicit drugs.  Allergies: No Known Allergies  Medications:  Prior to Admission medications   Medication Sig Start Date End Date Taking? Authorizing Provider  aspirin 325 MG tablet Take 325 mg by mouth every 6 (six) hours as needed for pain.   Yes Historical Provider, MD  HYDROcodone-acetaminophen (NORCO) 5-325 MG per tablet Take 1 tablet by mouth every 6 (six) hours as needed for pain. 03/10/13  Yes Hurman Horn, MD  lisinopril (PRINIVIL,ZESTRIL) 20 MG tablet Take 20 mg by mouth daily.    Yes Historical Provider, MD     Results for orders placed during the hospital encounter of  04/13/13 (from the past 48 hour(s))  PRO B NATRIURETIC PEPTIDE     Status: Abnormal   Collection Time    04/13/13  2:10 PM      Result Value Range   Pro B Natriuretic peptide (BNP) 24510.0 (*) 0 - 450 pg/mL  CBC     Status: Abnormal   Collection Time    04/13/13  2:10 PM      Result Value Range   WBC 6.8  4.0 - 10.5 K/uL   RBC 3.74 (*) 4.22 - 5.81 MIL/uL   Hemoglobin 10.2 (*) 13.0 - 17.0 g/dL   HCT 16.1 (*) 09.6 - 04.5 %   MCV 82.4  78.0 - 100.0 fL   MCH 27.3  26.0 - 34.0 pg   MCHC 33.1  30.0 - 36.0 g/dL   RDW 40.9  81.1 - 91.4 %   Platelets 270  150 - 400 K/uL  BASIC METABOLIC PANEL     Status: Abnormal   Collection Time    04/13/13  2:10 PM      Result Value Range   Sodium 139  135 - 145 mEq/L   Potassium 4.4  3.5 - 5.1 mEq/L   Chloride 103  96 - 112 mEq/L   CO2 29  19 - 32 mEq/L   Glucose, Bld 175 (*) 70 - 99 mg/dL   BUN  15  6 - 23 mg/dL   Creatinine, Ser 7.84  0.50 - 1.35 mg/dL   Calcium 8.7  8.4 - 69.6 mg/dL   GFR calc non Af Amer 69 (*) >90 mL/min   GFR calc Af Amer 80 (*) >90 mL/min   Comment: (NOTE)     The eGFR has been calculated using the CKD EPI equation.     This calculation has not been validated in all clinical situations.     eGFR's persistently <90 mL/min signify possible Chronic Kidney     Disease.  POCT I-STAT TROPONIN I     Status: None   Collection Time    04/13/13  2:45 PM      Result Value Range   Troponin i, poc 0.00  0.00 - 0.08 ng/mL   Comment 3            Comment: Due to the release kinetics of cTnI,     a negative result within the first hours     of the onset of symptoms does not rule out     myocardial infarction with certainty.     If myocardial infarction is still suspected,     repeat the test at appropriate intervals.    Dg Chest 2 View  04/13/2013   *RADIOLOGY REPORT*  Clinical Data: Shortness of breath, leg swelling  CHEST - 2 VIEW  Comparison: 03/10/2013  Findings: Chronic interstitial markings/emphysematous changes.  No focal  consolidation.  Suspected small right pleural effusion.  No pneumothorax.  The heart is normal in size.  Left subclavian pacemaker.  Degenerative changes of the visualized thoracolumbar spine.  Cholecystectomy clips.  IMPRESSION: Small right pleural effusion.  Otherwise, no evidence of acute cardiopulmonary disease.   Original Report Authenticated By: Charline Bills, M.D.    Review of Systems  Respiratory: Positive for shortness of breath.   Cardiovascular: Positive for chest pain and leg swelling. Negative for orthopnea, claudication and PND.  Neurological: Positive for dizziness. Negative for loss of consciousness.  All other systems reviewed and are negative.   Blood pressure 156/50, pulse 82, temperature 98.2 F (36.8 C), temperature source Oral, resp. rate 18, SpO2 99.00%. Physical Exam  Constitutional: He is oriented to person, place, and time. He appears well-developed and well-nourished. No distress.  Neck: No JVD present. Carotid bruit is not present.  Cardiovascular: Normal rate, regular rhythm and intact distal pulses.  Exam reveals gallop. Exam reveals no friction rub.   No murmur heard. Pulses:      Radial pulses are 2+ on the right side, and 2+ on the left side.       Dorsalis pedis pulses are 2+ on the right side, and 2+ on the left side.  Respiratory: Effort normal. No respiratory distress. He has no wheezes. He has no rales.  GI: Soft. Bowel sounds are normal. He exhibits no distension and no mass.  Musculoskeletal: He exhibits edema (3+ bilateral LEE).  Neurological: He is alert and oriented to person, place, and time.  Skin: Skin is warm and dry. He is not diaphoretic.  Psychiatric: He has a normal mood and affect. His behavior is normal.    Assessment/Plan: Principal Problem:   CHF (congestive heart failure) Active Problems:   HTN (hypertension)   DM (diabetes mellitus)   Cardiac pacemaker in situ  Plan: BNP is >24K + significant bilateral LEE. EKG is normal.  Troponin negative x 1. CXR shows small right-sided pleural effusion.  Will admit for IV diuretic therapy. Renal function  is normal with SCr. Of 1.0. Will treat with 40 mg IV Lasix BID. Will order strict I/Os, daily weights and low sodium diet. Will order 2D echo to evaluate systolic function. BP is moderately elevated w/ SBP in the 150s. Will continue on home ACE-I. Will start BB therapy once patient is euvolemic.  Will have PPM interrogated in the AM.     Roxy Horseman. SIMMONS, PA-C 04/13/2013, 4:55 PM     Agree with note written by Boyce Medici  PAC  Pt gives H/O dietary indiscretion and recent progressive BLE edema with DOE. H/O PTVPM insertion at [facility] 7-8 years ago. On ACE-I for HTN. Also C/O exertional CP. He doesn't smoke nor is he diabetic. Exam notable for 2+ BLE edema. Plan admit, IV diuresis, 2D for LV FXN. SQ Lovenox for DVT prophylaxis. OP MPI after D/C. Will prob require BB and diuretic at DC.  Runell Gess 04/13/2013 5:07 PM

## 2013-04-14 ENCOUNTER — Encounter (HOSPITAL_COMMUNITY): Payer: Self-pay | Admitting: General Practice

## 2013-04-14 DIAGNOSIS — I369 Nonrheumatic tricuspid valve disorder, unspecified: Secondary | ICD-10-CM

## 2013-04-14 LAB — BASIC METABOLIC PANEL
Calcium: 8 mg/dL — ABNORMAL LOW (ref 8.4–10.5)
Creatinine, Ser: 0.84 mg/dL (ref 0.50–1.35)
GFR calc Af Amer: >90 mL/min (ref >90)
GFR calc non Af Amer: 81 mL/min — ABNORMAL LOW (ref >90)
Sodium: 139 mEq/L (ref 135–145)

## 2013-04-14 LAB — CBC WITH DIFFERENTIAL/PLATELET
Basophils Absolute: 0.1 10*3/uL (ref 0.0–0.1)
Eosinophils Relative: 3 % (ref 0–5)
Lymphocytes Relative: 31 % (ref 12–46)
Lymphs Abs: 1.5 10*3/uL (ref 0.7–4.0)
MCV: 81.9 fL (ref 78.0–100.0)
Neutro Abs: 2.7 10*3/uL (ref 1.7–7.7)
Neutrophils Relative %: 56 % (ref 43–77)
Platelets: 240 10*3/uL (ref 150–400)
RBC: 3.82 MIL/uL — ABNORMAL LOW (ref 4.22–5.81)
RDW: 15.5 % (ref 11.5–15.5)
WBC: 4.8 10*3/uL (ref 4.0–10.5)

## 2013-04-14 LAB — TROPONIN I: Troponin I: <0.3 ng/mL (ref <0.30)

## 2013-04-14 LAB — PRO B NATRIURETIC PEPTIDE: Pro B Natriuretic peptide (BNP): 27766 pg/mL — ABNORMAL HIGH (ref 0–450)

## 2013-04-14 NOTE — Progress Notes (Signed)
[facility]  Subjective: He is breathing better. No complaints.   Objective: Vital signs in last 24 hours: Temp:  [98 F (36.7 C)-98.9 F (37.2 C)] 98 F (36.7 C) (09/10 0446) Pulse Rate:  [63-119] 68 (09/10 0810) Resp:  [13-22] 22 (09/10 0446) BP: (130-168)/(47-67) 134/53 mmHg (09/10 0810) SpO2:  [95 %-100 %] 99 % (09/10 0446) Weight:  [127 lb 10.3 oz (57.9 kg)-129 lb 6.4 oz (58.695 kg)] 127 lb 10.3 oz (57.9 kg) (09/10 0446) Last BM Date: 04/13/13  Intake/Output from previous day: 09/09 0701 - 09/10 0700 In: 240 [P.O.:240] Out: 1300 [Urine:1300] Intake/Output this shift: Total I/O In: 240 [P.O.:240] Out: 300 [Urine:300]  Medications Current Facility-Administered Medications  Medication Dose Route Frequency Provider Last Rate Last Dose  . 0.9 %  sodium chloride infusion  250 mL Intravenous PRN Brittainy Simmons, PA-C      . acetaminophen (TYLENOL) tablet 650 mg  650 mg Oral Q4H PRN Brittainy Simmons, PA-C      . ALPRAZolam Prudy Feeler) tablet 0.25 mg  0.25 mg Oral BID PRN Robbie Lis, PA-C   0.25 mg at 04/13/13 2130  . aspirin EC tablet 81 mg  81 mg Oral Daily Brittainy Simmons, PA-C   81 mg at 04/13/13 2210  . furosemide (LASIX) injection 40 mg  40 mg Intravenous BID Brittainy Simmons, PA-C   40 mg at 04/14/13 0818  . heparin injection 5,000 Units  5,000 Units Subcutaneous Q8H Brittainy Simmons, PA-C   5,000 Units at 04/14/13 0533  . lisinopril (PRINIVIL,ZESTRIL) tablet 20 mg  20 mg Oral Daily Brittainy Simmons, PA-C   20 mg at 04/13/13 2210  . ondansetron (ZOFRAN) injection 4 mg  4 mg Intravenous Q6H PRN Brittainy Simmons, PA-C      . sodium chloride 0.9 % injection 3 mL  3 mL Intravenous Q12H Brittainy Simmons, PA-C   3 mL at 04/13/13 2130  . sodium chloride 0.9 % injection 3 mL  3 mL Intravenous PRN Brittainy Simmons, PA-C      . zolpidem (AMBIEN) tablet 5 mg  5 mg Oral QHS PRN Robbie Lis, PA-C        PE: General appearance:  alert, cooperative and no distress Neck: +JVD at the level of the ear Lungs: clear to auscultation bilaterally Heart: regular rate and rhythm Extremities: 2+ bilateral LEE Pulses: 2+ and symmetric Skin: warm and dry Neurologic: Grossly normal  Lab Results:   Recent Labs  04/13/13 1410 04/14/13 0725  WBC 6.8 4.8  HGB 10.2* 10.3*  HCT 30.8* 31.3*  PLT 270 240   BMET  Recent Labs  04/13/13 1410 04/13/13 1923 04/14/13 0725  NA 139  --  139  K 4.4  --  3.6  CL 103  --  103  CO2 29  --  28  GLUCOSE 175*  --  87  BUN 15  --  13  CREATININE 1.00 0.98 0.84  CALCIUM 8.7  --  8.0*   Filed Weights   04/13/13 1950 04/14/13 0446  Weight: 129 lb 6.4 oz (58.695 kg) 127 lb 10.3 oz (57.9 kg)   BNP (last 3 results)  Recent Labs  04/13/13 1410 04/14/13 0725  PROBNP 24510.0* 40981.1*    Assessment/Plan  Principal Problem:   CHF (congestive heart failure) Active Problems:   HTN (hypertension)   DM (diabetes mellitus)   Cardiac pacemaker in situ  Plan: Pt admitted yesterday for acute CHF. He received one dose of IV Lasix last PM. He diuresed 1.1L  and lost 2 lbs. He is breathing better. LEE has improved some, but he continues to have 2+ bilateral pitting edema. Renal function and electrolytes remain WNL. Continue with BID IV Lasix. Will recheck BNP in the AM. Continue with strict I/Os and daily weights. A 2D echo was ordered to assess systolic function. Result is pending. He is already on an ACE-I. May need to initiate a BB once euvolemic. He ruled out for MI with negative cardiac enzymes. Acute exacerbation is likely secondary to dietary indiscretion. Will contact [employer] representative to interrogate PPM today.     LOS: 1 day    Brittainy M. Delmer Islam 04/14/2013 10:08 AM   Patient seen and examined. Agree with assessment and plan. Breathing better. I/O -1120 since admission. BNP today 27,766.0. Continue IV diuresis. Cr 0.84.    Lennette Bihari, MD,  [facility] 04/14/2013 12:55 PM

## 2013-04-14 NOTE — Consult Note (Signed)
Discussed with Dr. Berline Chough.  Agree with his documentation.  We (FPTS) were called to ER to admit this patient who was subsequently admitted by cardiology.  As of now, we are not involved in his inpatient care.  We will be happy to consult if the attending cardiologist desires.

## 2013-04-14 NOTE — Progress Notes (Signed)
  Echocardiogram 2D Echocardiogram has been performed.  Cathie Beams 04/14/2013, 12:35 PM

## 2013-04-14 NOTE — Progress Notes (Signed)
Pt wife reports pt fell twice at home PTA.  Once on Wednesday and again on Saturday.  Wife reports pt hit his head both times and believes he has been sleeping more since then.  Also reports pt fell on hip he had previously fractured.  On-call MD has been paged.  Reported off to night shift RN to follow up on. Nino Glow RN

## 2013-04-15 ENCOUNTER — Encounter (HOSPITAL_COMMUNITY): Payer: Self-pay | Admitting: Cardiology

## 2013-04-15 DIAGNOSIS — I5041 Acute combined systolic (congestive) and diastolic (congestive) heart failure: Principal | ICD-10-CM

## 2013-04-15 DIAGNOSIS — I420 Dilated cardiomyopathy: Secondary | ICD-10-CM | POA: Diagnosis present

## 2013-04-15 DIAGNOSIS — I272 Pulmonary hypertension, unspecified: Secondary | ICD-10-CM

## 2013-04-15 HISTORY — DX: Pulmonary hypertension, unspecified: I27.20

## 2013-04-15 LAB — PRO B NATRIURETIC PEPTIDE: Pro B Natriuretic peptide (BNP): 17133 pg/mL — ABNORMAL HIGH (ref 0–450)

## 2013-04-15 LAB — CBC WITH DIFFERENTIAL/PLATELET
Eosinophils Absolute: 0.1 10*3/uL (ref 0.0–0.7)
Eosinophils Relative: 3 % (ref 0–5)
HCT: 29.9 % — ABNORMAL LOW (ref 39.0–52.0)
Lymphocytes Relative: 27 % (ref 12–46)
Lymphs Abs: 1.2 10*3/uL (ref 0.7–4.0)
MCH: 26.9 pg (ref 26.0–34.0)
MCV: 82.8 fL (ref 78.0–100.0)
Monocytes Absolute: 0.4 10*3/uL (ref 0.1–1.0)
Monocytes Relative: 9 % (ref 3–12)
Platelets: 259 10*3/uL (ref 150–400)
RBC: 3.61 MIL/uL — ABNORMAL LOW (ref 4.22–5.81)
WBC: 4.5 10*3/uL (ref 4.0–10.5)

## 2013-04-15 LAB — BASIC METABOLIC PANEL
CO2: 30 mEq/L (ref 19–32)
Calcium: 8 mg/dL — ABNORMAL LOW (ref 8.4–10.5)
Chloride: 99 mEq/L (ref 96–112)
Creatinine, Ser: 0.94 mg/dL (ref 0.50–1.35)
Glucose, Bld: 171 mg/dL — ABNORMAL HIGH (ref 70–99)
Sodium: 135 mEq/L (ref 135–145)

## 2013-04-15 MED ORDER — POLYETHYLENE GLYCOL 3350 17 G PO PACK
17.0000 g | PACK | Freq: Every day | ORAL | Status: DC | PRN
  Filled 2013-04-15: qty 1

## 2013-04-15 MED ORDER — DOCUSATE SODIUM 100 MG PO CAPS
200.0000 mg | ORAL_CAPSULE | Freq: Every day | ORAL | Status: DC
  Administered 2013-04-15 – 2013-04-21 (×7): 200 mg via ORAL
  Filled 2013-04-15 (×8): qty 2

## 2013-04-15 NOTE — Progress Notes (Signed)
Multiple social issues for both pt and his wife.   Subjective:  No further chest pain, breathing improved   Objective: Vital signs in last 24 hours: Temp:  [98.7 F (37.1 C)-98.8 F (37.1 C)] 98.8 F (37.1 C) (09/11 0536) Pulse Rate:  [66-73] 69 (09/11 0536) Resp:  [18-20] 18 (09/11 0536) BP: (125-143)/(47-57) 125/47 mmHg (09/11 0536) SpO2:  [95 %-99 %] 95 % (09/11 0536) Weight:  [125 lb (56.7 kg)] 125 lb (56.7 kg) (09/11 0536) Weight change: -4 lb 6.4 oz (-1.996 kg) Last BM Date: 04/13/13 Intake/Output from previous day: -757 (total since admit -1817)  Wt 125 down from 129 on admit. 09/10 0701 - 09/11 0700 In: 843 [P.O.:840; I.V.:3] Out: 1600 [Urine:1600] Intake/Output this shift:    PE: General:Pleasant affect, NAD Skin:Warm and dry, brisk capillary refill HEENT:normocephalic, sclera clear, mucus membranes moist Neck:supple, no JVD sitting up right  Heart:S1S2 RRR without murmur, gallup, rub or click Lungs:diminished bibasilar otherwise clear without rales, rhonchi, or wheezes ZOX:WRUE, non tender, + BS, do not palpate liver spleen or masses Ext:no lower ext edema except in feet. Legs wrapped with Kerlix due to drainage. Neuro:alert and oriented, MAE, follows commands, + facial symmetry    PACER interrogation:  Normal PPM function. RA output and RV output adjusted to conserve battery life.  4 HVR episodes since last follow up 12/19/11 NSVT  6sec.  3 episodes of poss AVNRT at 150 bpm.  Longest lasted 36 min. This was in 09/2012.     Lab Results:  Recent Labs  04/14/13 0725 04/15/13 0600  WBC 4.8 4.5  HGB 10.3* 9.7*  HCT 31.3* 29.9*  PLT 240 259   BMET  Recent Labs  04/14/13 0725 04/15/13 0600  NA 139 135  K 3.6 3.6  CL 103 99  CO2 28 30  GLUCOSE 87 171*  BUN 13 18  CREATININE 0.84 0.94  CALCIUM 8.0* 8.0*    Recent Labs  04/14/13 0119 04/14/13 0725  TROPONINI <0.30 <0.30   BNP (last 3 results)  Recent Labs  04/13/13 1410 04/14/13 0725  04/15/13 0600  PROBNP 24510.0* 27766.0* 17133.0*    Studies/Results: Dg Chest 2 View  04/13/2013   *RADIOLOGY REPORT*  Clinical Data: Shortness of breath, leg swelling  CHEST - 2 VIEW  Comparison: 03/10/2013  Findings: Chronic interstitial markings/emphysematous changes.  No focal consolidation.  Suspected small right pleural effusion.  No pneumothorax.  The heart is normal in size.  Left subclavian pacemaker.  Degenerative changes of the visualized thoracolumbar spine.  Cholecystectomy clips.  IMPRESSION: Small right pleural effusion.  Otherwise, no evidence of acute cardiopulmonary disease.   Original Report Authenticated By: Charline Bills, M.D.   2D Echo:  Left ventricle: The cavity size was mildly dilated. Wall thickness was at the upper limits of normal. Systolic function was moderately to severely reduced. The estimated ejection fraction was in the range of 30% to 35%. Diffuse hypokinesis. Probable severe hypokinesis of the inferolateral myocardium. Doppler parameters are consistent with abnormal left ventricular relaxation (grade 1 diastolic dysfunction). Doppler parameters are consistent with elevated mean left atrial filling pressure. - Aortic valve: Mildly calcified annulus. Trileaflet; mildly thickened leaflets. Moderate regurgitation. - Mitral valve: Mildly thickened leaflets . Mild to moderate regurgitation. - Right ventricle: The cavity size was mildly dilated. Systolic pressure was increased. - Tricuspid valve: Moderate-severe regurgitation. - Pulmonic valve: Mild to moderate regurgitation. - Pulmonary arteries: PA peak pressure: 62mm Hg (S). Impressions:  - The right ventricular systolic pressure was increased consistent  with severe pulmonary hypertension     Medications: I have reviewed the patient's current medications. Scheduled Meds: . aspirin EC  81 mg Oral Daily  . furosemide  40 mg Intravenous BID  . heparin  5,000 Units Subcutaneous Q8H  . lisinopril   20 mg Oral Daily  . sodium chloride  3 mL Intravenous Q12H   Continuous Infusions:  PRN Meds:.sodium chloride, acetaminophen, ALPRAZolam, ondansetron (ZOFRAN) IV, sodium chloride, zolpidem  Assessment/Plan: Principal Problem:   Acute combined systolic and diastolic CHF, NYHA class 3, EF 30-35%, grade I diastolic dysfunction, PA pk pressure 62 mmHg  Active Problems:   HTN (hypertension)   DM (diabetes mellitus)   Cardiac pacemaker in situ, Biotronik   Pulmonary hypertension   Cardiomyopathy, dilated  PLAN:  Now noted to have dilated cardiomyopathy,  And Pul. HTN with PA pk pressure 62 mmHg.  Negative troponin's.  May need rt and Lt heart cath to assist with improved management , to determine if ischemic CAD playing a role.    Pro BNP mildly decreased since admit.  On Lasix 40 mg BID IV.  Have cardiac rehab ambulate pt.   Mild constipation will add stool softner. Have case manager and social worker assist.   LOS: 2 days   Time spent with pt. :20 minutes. [facility] R  Nurse Practitioner Certified Pager [phone] 04/15/2013, 8:53 AM   I have seen and examined the patient along with Nada Boozer, NP.  I have reviewed the chart, notes and new data.  I agree with NP's note.  Key new complaints: considerably improved, breathing normally while fully supine Key examination changes: apical heave, + s3, no JVD, trivial residual ankle edema, weight reduced 2 kg since admission Key new findings / data: echo shows moderately reduced EF with probable regional wall motion abnormalities Only 18% V pacing by PM check - unlikely cause of CHF  PLAN: Strongly recommend right and left heart cath - suspicion for CAD is high and need to see if PA HTN is entirely explained by pulmonary venous congestion or there is a fixed arteriolar component. He wants to talk it over with his wife - I have been unable to get through on any of the phone #s he provided or are documented in the chart. He believes she  intends to visit today and will discuss then.  Thurmon Fair, MD, [facility] [phone] 04/15/2013, 10:12 AM

## 2013-04-15 NOTE — Progress Notes (Signed)
CARDIAC REHAB PHASE I   PRE:  Rate/Rhythm: 70 SR    BP: sitting 100/46    SaO2: 96 RA  MODE:  Ambulation: 260 ft   POST:  Rate/Rhythm: 85 SR with PVCs    BP: sitting 140/60     SaO2: 93 RA  Used RW, fairly steady. Unsteady without RW. Sts he uses his wife's second RW at home. Sts he is much less SOB walking. To recliner after walk. Gave HF booklet and told about video, didn't want to watch right now. Will continue to follow. [phone]   Elissa Lovett [place] CES, ACSM 04/15/2013 12:03 PM

## 2013-04-15 NOTE — ED Provider Notes (Signed)
Medical screening examination/treatment/procedure(s) were conducted as a shared visit with non-physician practitioner(s) and myself.  I personally evaluated the patient during the encounter   Candyce Churn, MD 04/15/13 [phone]
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
## 2013-04-15 NOTE — ED Provider Notes (Signed)
Medical screening examination/treatment/procedure(s) were conducted as a shared visit with non-physician practitioner(s) and myself.  I personally evaluated the patient during the encounter  79 yo male with several weeks of worsening lower extremity swelling and DOE.  On exam, lungs CTAB.  Legs show 2+ BLE edema. Pt denies CHF.  Symptoms felt to represent new onset CHF. Admitted  Clinical Impression: 1. CHF (congestive heart failure)   2. HTN (hypertension)   3. DM (diabetes mellitus)   4. Cardiac pacemaker in situ     Candyce Churn, MD 04/15/13 [phone]

## 2013-04-15 NOTE — Progress Notes (Signed)
Utilization Review Completed Jakell Trusty J. Andersyn Fragoso, RN, BSN, NCM [phone]  

## 2013-04-16 DIAGNOSIS — I428 Other cardiomyopathies: Secondary | ICD-10-CM

## 2013-04-16 DIAGNOSIS — E119 Type 2 diabetes mellitus without complications: Secondary | ICD-10-CM

## 2013-04-16 DIAGNOSIS — I2789 Other specified pulmonary heart diseases: Secondary | ICD-10-CM

## 2013-04-16 LAB — BASIC METABOLIC PANEL
BUN: 18 mg/dL (ref 6–23)
CO2: 35 mEq/L — ABNORMAL HIGH (ref 19–32)
Calcium: 8.3 mg/dL — ABNORMAL LOW (ref 8.4–10.5)
Chloride: 99 mEq/L (ref 96–112)
Creatinine, Ser: 0.94 mg/dL (ref 0.50–1.35)
Glucose, Bld: 120 mg/dL — ABNORMAL HIGH (ref 70–99)

## 2013-04-16 LAB — CBC WITH DIFFERENTIAL/PLATELET
Basophils Absolute: 0 10*3/uL (ref 0.0–0.1)
Eosinophils Absolute: 0.2 10*3/uL (ref 0.0–0.7)
Eosinophils Relative: 3 % (ref 0–5)
HCT: 28 % — ABNORMAL LOW (ref 39.0–52.0)
Lymphocytes Relative: 32 % (ref 12–46)
MCH: 27 pg (ref 26.0–34.0)
MCHC: 33.2 g/dL (ref 30.0–36.0)
MCV: 81.2 fL (ref 78.0–100.0)
Monocytes Absolute: 0.4 10*3/uL (ref 0.1–1.0)
Platelets: 239 10*3/uL (ref 150–400)
RDW: 15.3 % (ref 11.5–15.5)
WBC: 5.3 10*3/uL (ref 4.0–10.5)

## 2013-04-16 LAB — PRO B NATRIURETIC PEPTIDE: Pro B Natriuretic peptide (BNP): 8869 pg/mL — ABNORMAL HIGH (ref 0–450)

## 2013-04-16 NOTE — Discharge Summary (Addendum)
Physician Discharge Summary  Patient ID: Frederick [name] MRN: [id number] DOB/AGE: [birth year] 77 y.o.  Admit date: 04/13/2013 Discharge date: 04/21/2013  Admission Diagnoses: Acute on Chronic Combined Systolic and Diastolic HF  Discharge Diagnoses:  Principal Problem:   Acute combined systolic and diastolic CHF, NYHA class 3, EF 30-35%, grade I diastolic dysfunction, PA pk pressure 62 mmHg  Active Problems:   HTN (hypertension)   DM (diabetes mellitus)   Cardiac pacemaker in situ, Biotronik   Pulmonary hypertension   Cardiomyopathy, dilated- nonischemic w/ normal cath 04/19/13 EF of 20-25%   Discharged Condition: stable  Hospital Course: The patient is a 79 y/o AAM, with a history of PPM insertion 2-3 years ago for bradycardia, HTN and diabetes. The PPM is a Biotronik device that was inserted at [facility], however he has not maintained follow-up since it was placed. He moved from [place] to [place] several years ago and failed to establish care with a PCP and cardiologist. We were consulted to see the patient, for the first time, in the [facility] [place] when he presented with complaints of worsening bilateral lower extremity edema and shortness of breath. Work-up in the ER revealed an acute exacerbation of CHF. His BNP was elevated at 24,510. His CXR demonstrated a small right-sided pleural effusion. He had significant peripheral edema on exam, mainly in the lower extremities (3+ pitting edema bilaterally). His EKG was normal and serial cardiac enzymes were negative. He had normal renal function with a SCr of 1.0. He was admitted to telemetry and placed on 40 mg IV Lasix BID for diuresis. He was also placed on an ACE-I. It was decided to wait and initiate a beta blocker after the patient was euvolemic. He diuresed well and his symptoms improved, as did his edema.  A 2D echo was obtained, which demonstrated severely reduced systolic function. His EF was estimated at 30-35%. He had diffuse  hypokinesis and severe hypokinesis of the inferolateral myocardium. He had Grade I diastolic dysfunction. Right ventricular systolic pressure was also increased, consistent with severe pulmonary hypertension. He was continued on IV Lasix for diuresis. Once near euvolemic, a R/LHC was strongly recommended to assess coronaries for ischemic cardiomyopathy and to measure pulmonary pressures. The patient initially requested to be transferred to the [facility] hospital is [facility] for further care, however he reconsidered and stayed at [facility].  On 04/19/13 he underwent a R/LHC by Dr. Tresa Endo via the right femoral artery. He was found to have normal coronaries and severely reduced systolic function, suggesting he has nonischemic cardiomyopathy. His pulmonary pressures were normal and findings suggested that he was euvolemic. He left the cath lab in stable condition. He had no post-cath complications. The right femoral access site remained stable, free from hematoma and bruit. A low dose beta blocker was added to his medical regimen.  He tolerated the addition well. Prior to discharge, he was fitted with a LifeVest for prevention of SCD. He was instructed to wear daily for a minimum of 3 months, while on medical therapy. He will have a 2D echo at the end of that period to reassess systolic function and to determine if an ICD is warranted. He will require upward titration of his HF meds as an outpatient. He was last seen and examined by Dr. Rennis Golden, who determined that he was stable for discharge home. His weight on the day of discharge was 124 lbs. He was discharge home on 81 mg of ASA, 3.125 mg of Coreg BID, 20 mg of Lisinopril and  20 mg of PO Lasix. He will follow-up with Corine Shelter, PA-C at [facility].      Consults: None  Significant Diagnostic Studies:   2D Echo 04/14/13 Study Conclusions  - Left ventricle: The cavity size was mildly dilated. Wall thickness was at the upper limits of normal. Systolic function was moderately to  severely reduced. The estimated ejection fraction was in the range of 30% to 35%. Diffuse hypokinesis. Probable severe hypokinesis of the inferolateral myocardium. Doppler parameters are consistent with abnormal left ventricular relaxation (grade 1 diastolic dysfunction). Doppler parameters are consistent with elevated mean left atrial filling pressure. - Aortic valve: Mildly calcified annulus. Trileaflet; mildly thickened leaflets. Moderate regurgitation. - Mitral valve: Mildly thickened leaflets . Mild to moderate regurgitation. - Right ventricle: The cavity size was mildly dilated. Systolic pressure was increased. - Tricuspid valve: Moderate-severe regurgitation. - Pulmonic valve: Mild to moderate regurgitation. - Pulmonary arteries: PA peak pressure: 62mm Hg (S). Impressions:The right ventricular systolic pressure was increased consistent with severe pulmonary hypertension.   R/LHC 04/19/13 HEMODYNAMICS:  RA: 8/7  RV: 35/6  PA:36/16  Pc: 15  AO: 122/60  LV: 122/11/17  Cardiac Output: 3.8 l/m (thermodilution); 4.7 l/m (Fick)  Cardiac Index: 2.4l/m/m2 2.9l/m/m2  ANGIOGRAPHY:  1. Left main: Angiographically normal it gave rise to the LAD and left circumflex system.  2. LAD: Angiographically normal  3. Left circumflex: Immediately gave rise to a was a ramus intermediate like vessel and a high circumflex marginal vessel. The circumflex and its branches were normal.  4. Right coronary artery: Angiographically normal vessel the small PDA system but more prominent PLA system.  5. left ventriculography revealed a dilated left ventricle with left ventricular hypertrophy. There is severe LV dysfunction with an ejection fraction of 20-25%. There did appear to be more pronounced basal inferior hypocontractility relative to the other walls.  IMPRESSION:  Severe nonischemic cardiomyopathy  Essentially normal coronary arteries without significant obstructive disease    Treatments: See  Hospital Course  Discharge Exam: Blood pressure 114/44, pulse 66, temperature 97.9 F (36.6 C), temperature source Oral, resp. rate 18, height 5\' 6"  (1.676 m), weight 124 lb 5.4 oz (56.4 kg), SpO2 98.00%.   Disposition: 07-Left Against Medical Advice      Discharge Orders   Future Appointments Provider Department Dept Phone   04/23/2013 8:20 AM Wilburt Finlay, PA-C [facility] [place] [phone]   04/28/2013 3:20 PM Abelino Derrick, PA-C [facility] [place] [phone]   Future Orders Complete By Expires   Diet - low sodium heart healthy  As directed    Diet - low sodium heart healthy  As directed    Discharge instructions  As directed    Comments:     Wear LifeVest as directed.   Driving Restrictions  As directed    Comments:     No driving with LifeVest   Increase activity slowly  As directed    Increase activity slowly  As directed        Medication List    STOP taking these medications       aspirin 325 MG tablet  Replaced by:  aspirin 81 MG EC tablet      TAKE these medications       aspirin 81 MG EC tablet  Take 1 tablet (81 mg total) by mouth daily.     carvedilol 3.125 MG tablet  Commonly known as:  COREG  Take 1 tablet (3.125 mg total) by mouth 2 (two)  times daily with a meal.     furosemide 20 MG tablet  Commonly known as:  LASIX  Take 1 tablet (20 mg total) by mouth daily.     HYDROcodone-acetaminophen 5-325 MG per tablet  Commonly known as:  NORCO  Take 1 tablet by mouth every 6 (six) hours as needed for pain.     lisinopril 20 MG tablet  Commonly known as:  PRINIVIL,ZESTRIL  Take 20 mg by mouth daily.       Follow-up Information   Follow up with Abelino Derrick, PA-C On 04/28/2013. (3:20 pm )    Specialty:  Cardiology   Contact information:   [address] [place] [address] [phone]     TIME SPENT ON DISCHARGE, INCLUDING PHYSICIAN TIME: >30  MINUTES  Signed: Allayne Butcher, PA-C 04/21/2013, 2:43 PM

## 2013-04-16 NOTE — Progress Notes (Signed)
[facility]  Subjective: Feeling much better. No chest pain and SOB had improved. Pt notes significant improvement in leg swelling.   Objective: Vital signs in last 24 hours: Temp:  [98.2 F (36.8 C)-98.7 F (37.1 C)] 98.7 F (37.1 C) (09/12 0605) Pulse Rate:  [77-83] 83 (09/12 0605) Resp:  [16-18] 16 (09/12 0605) BP: (117-136)/(40-52) 136/52 mmHg (09/12 0605) SpO2:  [97 %-99 %] 97 % (09/12 0605) Weight:  [123 lb 3.2 oz (55.883 kg)] 123 lb 3.2 oz (55.883 kg) (09/12 0605) Last BM Date: 04/14/13  Intake/Output from previous day: 09/11 0701 - 09/12 0700 In: 902 [P.O.:902] Out: 1725 [Urine:1725] Intake/Output this shift: Total I/O In: -  Out: 200 [Urine:200]  Medications Current Facility-Administered Medications  Medication Dose Route Frequency Provider Last Rate Last Dose  . 0.9 %  sodium chloride infusion  250 mL Intravenous PRN Brittainy Simmons, PA-C      . acetaminophen (TYLENOL) tablet 650 mg  650 mg Oral Q4H PRN Brittainy Simmons, PA-C      . ALPRAZolam Prudy Feeler) tablet 0.25 mg  0.25 mg Oral BID PRN Robbie Lis, PA-C   0.25 mg at 04/13/13 2130  . aspirin EC tablet 81 mg  81 mg Oral Daily Brittainy Simmons, PA-C   81 mg at 04/15/13 0903  . docusate sodium (COLACE) capsule 200 mg  200 mg Oral Daily Nada Boozer, NP   200 mg at 04/15/13 1237  . furosemide (LASIX) injection 40 mg  40 mg Intravenous BID Brittainy Simmons, PA-C   40 mg at 04/15/13 1735  . heparin injection 5,000 Units  5,000 Units Subcutaneous Q8H Brittainy Simmons, PA-C   5,000 Units at 04/16/13 1610  . lisinopril (PRINIVIL,ZESTRIL) tablet 20 mg  20 mg Oral Daily Brittainy Simmons, PA-C   20 mg at 04/15/13 0903  . ondansetron (ZOFRAN) injection 4 mg  4 mg Intravenous Q6H PRN Brittainy Simmons, PA-C      . polyethylene glycol (MIRALAX / GLYCOLAX) packet 17 g  17 g Oral Daily PRN Nada Boozer, NP      . sodium chloride 0.9 % injection 3 mL  3 mL Intravenous Q12H Brittainy  Simmons, PA-C   3 mL at 04/15/13 2203  . sodium chloride 0.9 % injection 3 mL  3 mL Intravenous PRN Brittainy Simmons, PA-C      . zolpidem (AMBIEN) tablet 5 mg  5 mg Oral QHS PRN Robbie Lis, PA-C        PE: General appearance: alert, cooperative and no distress Lungs: clear to auscultation bilaterally Heart: regular rate and rhythm Extremities: 1+ bilateral pretibial edema Pulses: 2+ and symmetric Skin: warm and dry Neurologic: Grossly normal  Lab Results:   Recent Labs  04/14/13 0725 04/15/13 0600 04/16/13 0500  WBC 4.8 4.5 5.3  HGB 10.3* 9.7* 9.3*  HCT 31.3* 29.9* 28.0*  PLT 240 259 239   BMET  Recent Labs  04/14/13 0725 04/15/13 0600 04/16/13 0500  NA 139 135 139  K 3.6 3.6 3.6  CL 103 99 99  CO2 28 30 35*  GLUCOSE 87 171* 120*  BUN 13 18 18   CREATININE 0.84 0.94 0.94  CALCIUM 8.0* 8.0* 8.3*   Filed Weights   04/14/13 0446 04/15/13 0536 04/16/13 0605  Weight: 127 lb 10.3 oz (57.9 kg) 125 lb (56.7 kg) 123 lb 3.2 oz (55.883 kg)   BNP (last 3 results)  Recent Labs  04/14/13 0725 04/15/13 0600 04/16/13 0500  PROBNP 27766.0* 17133.0* 8869.0*   Studies/Results:  2D echo 04/14/13 Study Conclusions  - Left ventricle: The cavity size was mildly dilated. Wall thickness was at the upper limits of normal. Systolic function was moderately to severely reduced. The estimated ejection fraction was in the range of 30% to 35%. Diffuse hypokinesis. Probable severe hypokinesis of the inferolateral myocardium. Doppler parameters are consistent with abnormal left ventricular relaxation (grade 1 diastolic dysfunction). Doppler parameters are consistent with elevated mean left atrial filling pressure. - Aortic valve: Mildly calcified annulus. Trileaflet; mildly thickened leaflets. Moderate regurgitation. - Mitral valve: Mildly thickened leaflets . Mild to moderate regurgitation. - Right ventricle: The cavity size was mildly dilated. Systolic pressure was  increased. - Tricuspid valve: Moderate-severe regurgitation. - Pulmonic valve: Mild to moderate regurgitation. - Pulmonary arteries: PA peak pressure: 62mm Hg (S). Impressions:  - The right ventricular systolic pressure was increased consistent with severe pulmonary hypertension.   Assessment/Plan  Principal Problem:   Acute combined systolic and diastolic CHF, NYHA class 3, EF 30-35%, grade I diastolic dysfunction, PA pk pressure 62 mmHg  Active Problems:   HTN (hypertension)   DM (diabetes mellitus)   Cardiac pacemaker in situ, Biotronik   Pulmonary hypertension   Cardiomyopathy, dilated  Plan: Pt diuresed an additional 1.7 L yesterday. 2.8L since admission. His weight is down 4 lbs. BNP has decreased significantly, from 27K on admission down to 8K. He notes significant improvement in breathing and decreased edema. Renal function and electrolytes are WNL. BP is stable. Continue with IV Lasix. He has discussed R/LHC with his wife and wishes to proceed. ? Cath later today. He did eat breakfast this morning. I have asked him to discontinue PO intake until discussed with MD.  H/H continues to downtrend, despite diuresis. H/H today is 9.3/28. ? GI bleed since renal function is normal. He has noticed dark stools. Will order FOBT. MD to follow.    LOS: 3 days   Brittainy M. Sharol Harness, PA-C 04/16/2013 8:30 AM  So - interesting change of plans from this AM.  Thankfully, he appears to be feeling much better after diuresis -- I went in to discuss +/- R&LHC diagnostic only & was abruptly told that the patient would like to be transferred to the [place].  Does not want any further care or procedures.  We will arrange for transfer.  Current Problems / concerns:  Dx of likely Ischemic CM - regional WMA on Echo  Acute (on chronic systolic / diastolic HF  Pulmonary HTN - ? Primary or Secondary  Anemia - unclear etiology, but with c/o possible melanotic stool  BP & HR are stable on current  medications & renal function stable.  Not on BB yet due to acute CHF presentation & remains volume overloaded - still with edema, faint S3 with mild LVD.  We will As SW/CM to assist with expediting this transfer.  Marykay Lex, MD

## 2013-04-16 NOTE — Progress Notes (Signed)
Spoke with Frederick [name], wife and wife caregiver at bedside regarding wanting to be transferred to [facility] hospital in [place].  Frederick [name] shaving while wife caregiver "freshening up" wife.  Wife complaining more about service and quality of care given to her (who is not the Frederick [name]) than Frederick [name].  Frederick [name] silently shaving and going along with wife's request to be transferred.  NCM notified them that call has been put in to [facility] [[phone]]for transfer.  Also explained that since [place] has Medicare as his principal payer, that he may not be transferred right away(per April of [place]).  Frederick [name], wife and caregiver states they understand the process.

## 2013-04-16 NOTE — Progress Notes (Signed)
Was notified by pt's visitor, wife, and pt that pt wanted to be transferred to the [facility] hospital in [place].  Pt's wife was requesting a case manager to get pt transferred, informed pt and his wife that I would have to let to the dr know to place order to transfer out.  Pt 's wife questioned why I had to do that and accused [place] having a camera placed on her and the pt all night.  Informed pt and pt's wife that the camera is for pt's safety and it has not been on since the pt has been placed in this room.  PA notified of request to transfer and Dr. Herbie Baltimore notified as well.  Dr. Herbie Baltimore seen pt and notified me that he will place order to transfer and case management has been notified and is working on transfer request.

## 2013-04-17 LAB — CBC WITH DIFFERENTIAL/PLATELET
Basophils Relative: 1 % (ref 0–1)
Eosinophils Absolute: 0.1 10*3/uL (ref 0.0–0.7)
Eosinophils Relative: 2 % (ref 0–5)
Lymphs Abs: 1.1 10*3/uL (ref 0.7–4.0)
MCH: 26.9 pg (ref 26.0–34.0)
MCHC: 32.4 g/dL (ref 30.0–36.0)
MCV: 82.9 fL (ref 78.0–100.0)
Monocytes Relative: 12 % (ref 3–12)
Platelets: 207 10*3/uL (ref 150–400)
RBC: 3.46 MIL/uL — ABNORMAL LOW (ref 4.22–5.81)

## 2013-04-17 NOTE — Progress Notes (Signed)
CARDIAC REHAB PHASE I   PRE:  Rate/Rhythm: 85 SR  BP:  Supine:   Sitting: 120/60  Standing:    SaO2: 95% RA  MODE:  Ambulation: 280 ft   POST:  Rate/Rhythm: 88  BP:  Supine:   Sitting: 152/60  Standing:    SaO2: 93% RA  [id number]- Patient tolerated ambulation fair with assist x1 and pushing rolling walker. Pt c/o "chest pressure" and SOB during walk, which resolved with rest. Patient's RN informed of symptoms with exertion. To chair after walk with legs elevated, VSS.   Cristy Hilts, MS, ACSM CES

## 2013-04-17 NOTE — Progress Notes (Signed)
Pt. Seen and examined. Agree with the NP/PA-C note as written. Marked improvement in LE edema - not detectable. JVP is near flat. BNP was ~8000 yesterday, but has come down 3x fold. Will hold off on lasix today, re-assess BNP in the am. He wishes to stay for [facility] on Monday.  Chrystie Nose, MD, [facility] Attending Cardiologist [facility]

## 2013-04-17 NOTE — Progress Notes (Signed)
Patient stated he wants heart cath and does not want to go to [place] [facility] in [place].  Will notify Dr. Allyson Sabal or partner on call.  Off-going nurse, Helyn App, reported that she spoke with patient's wife this morning and discussed plan for her visitation that she is welcome to come and visit and that [facility] staff will be unable to assist with her personal care per policy.  Informed patient that she had discussed with wife.

## 2013-04-17 NOTE — Progress Notes (Signed)
Patient wife and daughter called that they want [facility] instead of transfer to [facility], MD called , MD will come to see the patient and talk to the family. Will continue to monitor patient. Shila Kruczek TijaniRN

## 2013-04-17 NOTE — Progress Notes (Signed)
Pt discussed wanting cardiac cath here with Huey Bienenstock PA and Dr. Rennis Golden. Cath scheduled for Monday.  Pt ambulated in hall with Cardiac Rehab using walker - tolerated without shortness of breath.  Rehab nurse stated he had some chest discomfort with ambmulation that resolved when back @ rest.  Pt instructed to call if he has chest discomfort or any other pain.

## 2013-04-17 NOTE — Progress Notes (Signed)
Pt's wife called and informed RN that she wanted to come and visit her husband, advised that she is welcome to visit her husband but hospital staff will not be able to assist with her personal needs per hospital policy.

## 2013-04-17 NOTE — Progress Notes (Signed)
Subjective: He woke up with 7/10 chest burning.  Resolved on its own.    Objective: Vital signs in last 24 hours: Temp:  [98.2 F (36.8 C)-99.5 F (37.5 C)] 98.2 F (36.8 C) (09/13 0515) Pulse Rate:  [75-77] 75 (09/13 0515) Resp:  [18] 18 (09/13 0515) BP: (115-127)/(38-58) 115/38 mmHg (09/13 0515) SpO2:  [98 %] 98 % (09/13 0515) Weight:  [117 lb 3.2 oz (53.162 kg)] 117 lb 3.2 oz (53.162 kg) (09/13 0515) Last BM Date: 04/14/13  Intake/Output from previous day: 09/12 0701 - 09/13 0700 In: 720 [P.O.:720] Out: 2175 [Urine:2175] Intake/Output this shift: Total I/O In: -  Out: 250 [Urine:250]  Medications Current Facility-Administered Medications  Medication Dose Route Frequency Provider Last Rate Last Dose  . 0.9 %  sodium chloride infusion  250 mL Intravenous PRN Brittainy Simmons, PA-C      . acetaminophen (TYLENOL) tablet 650 mg  650 mg Oral Q4H PRN Brittainy Simmons, PA-C      . ALPRAZolam Prudy Feeler) tablet 0.25 mg  0.25 mg Oral BID PRN Robbie Lis, PA-C   0.25 mg at 04/13/13 2130  . aspirin EC tablet 81 mg  81 mg Oral Daily Brittainy Simmons, PA-C   81 mg at 04/16/13 0951  . docusate sodium (COLACE) capsule 200 mg  200 mg Oral Daily Nada Boozer, NP   200 mg at 04/16/13 0951  . furosemide (LASIX) injection 40 mg  40 mg Intravenous BID Brittainy Simmons, PA-C   40 mg at 04/16/13 1927  . heparin injection 5,000 Units  5,000 Units Subcutaneous Q8H Brittainy Simmons, PA-C   5,000 Units at 04/17/13 1610  . lisinopril (PRINIVIL,ZESTRIL) tablet 20 mg  20 mg Oral Daily Brittainy Simmons, PA-C   20 mg at 04/16/13 0951  . ondansetron (ZOFRAN) injection 4 mg  4 mg Intravenous Q6H PRN Brittainy Simmons, PA-C      . polyethylene glycol (MIRALAX / GLYCOLAX) packet 17 g  17 g Oral Daily PRN Nada Boozer, NP      . sodium chloride 0.9 % injection 3 mL  3 mL Intravenous Q12H Brittainy Simmons, PA-C   3 mL at 04/16/13 2228  . sodium chloride 0.9 % injection 3 mL  3 mL Intravenous PRN  Brittainy Simmons, PA-C      . zolpidem (AMBIEN) tablet 5 mg  5 mg Oral QHS PRN Robbie Lis, PA-C        PE: General appearance: alert, cooperative and no distress Neck: no JVD Lungs: clear to auscultation bilaterally and Although BS are decreased. Heart: regular rate and rhythm, S1, S2 normal, no murmur, click, rub or gallop Abdomen: +BS.  No distention .  Nontender. Extremities: No LEE Pulses: 2+ and symmetric 1+ distal pulses. Skin: Warm and dry Neurologic: Grossly normal  Lab Results:   Recent Labs  04/15/13 0600 04/16/13 0500 04/17/13 0655  WBC 4.5 5.3 5.4  HGB 9.7* 9.3* 9.3*  HCT 29.9* 28.0* 28.7*  PLT 259 239 207   BMET  Recent Labs  04/15/13 0600 04/16/13 0500  NA 135 139  K 3.6 3.6  CL 99 99  CO2 30 35*  GLUCOSE 171* 120*  BUN 18 18  CREATININE 0.94 0.94  CALCIUM 8.0* 8.3*    Assessment/Plan   Principal Problem:   Acute combined systolic and diastolic CHF, NYHA class 3, EF 30-35%, grade I diastolic dysfunction, PA pk pressure 62 mmHg  Active Problems:   HTN (hypertension)   DM (diabetes mellitus)   Cardiac pacemaker in situ, Biotronik  Pulmonary hypertension   Cardiomyopathy, dilated  Plan:  The patient has decided that he wants the R/L heart caths here instead of at the [place] since he is here.  Net fluids:  -1.5L/ -4.1L.  He appears euvolemic.  Will DC lasix for now. Reassess in AM.  Will likely need PO dosing at or prior to DC.  Labs are stable and BNP has decreased considerably.      LOS: 4 days    Frederick [name] 04/17/2013 8:59 AM

## 2013-04-18 LAB — CBC WITH DIFFERENTIAL/PLATELET
Basophils Absolute: 0 10*3/uL (ref 0.0–0.1)
Eosinophils Absolute: 0.2 10*3/uL (ref 0.0–0.7)
Eosinophils Relative: 3 % (ref 0–5)
MCH: 27.1 pg (ref 26.0–34.0)
MCV: 83 fL (ref 78.0–100.0)
Neutrophils Relative %: 61 % (ref 43–77)
Platelets: 184 10*3/uL (ref 150–400)
RDW: 15.6 % — ABNORMAL HIGH (ref 11.5–15.5)
WBC: 6.3 10*3/uL (ref 4.0–10.5)

## 2013-04-18 LAB — PRO B NATRIURETIC PEPTIDE: Pro B Natriuretic peptide (BNP): 6223 pg/mL — ABNORMAL HIGH (ref 0–450)

## 2013-04-18 LAB — PROTIME-INR: Prothrombin Time: 14.1 seconds (ref 11.6–15.2)

## 2013-04-18 MED ORDER — SODIUM CHLORIDE 0.9 % IJ SOLN: 3.0000 mL | INTRAMUSCULAR | Status: DC | PRN

## 2013-04-18 MED ORDER — ASPIRIN EC 81 MG PO TBEC
81.0000 mg | DELAYED_RELEASE_TABLET | Freq: Every day | ORAL | Status: DC
  Administered 2013-04-20 – 2013-04-21 (×2): 81 mg via ORAL
  Filled 2013-04-18 (×2): qty 1

## 2013-04-18 MED ORDER — SODIUM CHLORIDE 0.9 % IV SOLN: 250.0000 mL | INTRAVENOUS | Status: DC | PRN

## 2013-04-18 MED ORDER — SODIUM CHLORIDE 0.9 % IV SOLN
1.0000 mL/kg/h | INTRAVENOUS | Status: DC
  Administered 2013-04-19: 05:00:00 1 mL/kg/h via INTRAVENOUS

## 2013-04-18 MED ORDER — FUROSEMIDE 40 MG PO TABS
40.0000 mg | ORAL_TABLET | Freq: Once | ORAL | Status: AC
  Administered 2013-04-18: 10:00:00 40 mg via ORAL
  Filled 2013-04-18: qty 1

## 2013-04-18 MED ORDER — SODIUM CHLORIDE 0.9 % IJ SOLN
3.0000 mL | Freq: Two times a day (BID) | INTRAMUSCULAR | Status: DC
  Administered 2013-04-18: 3 mL via INTRAVENOUS

## 2013-04-18 MED ORDER — ASPIRIN 81 MG PO CHEW
324.0000 mg | CHEWABLE_TABLET | ORAL | Status: AC
  Administered 2013-04-19: 324 mg via ORAL
  Filled 2013-04-18: qty 4

## 2013-04-18 NOTE — Plan of Care (Signed)
Problem: Phase I Progression Outcomes Goal: EF % per last Echo/documented,Core Reminder form on chart Outcome: Completed/Met Date Met:  04/18/13 30-35 %

## 2013-04-18 NOTE — Progress Notes (Signed)
Pt signed informed consent for cardiac cath - procedure for 04/19/13.  Viewed Heart Failure video and cath video.  Teach back used to assess knowledge of upcoming procedure and home management for heart failure.  Up in chair today.

## 2013-04-18 NOTE — Progress Notes (Signed)
DAILY PROGRESS NOTE  Subjective:  No events overnight. Still out - , however, lasix held yesterday. No chest pain complaints. BNP is decreased to 6223 this morning.   Objective:  Temp:  [98.4 F (36.9 C)-100.3 F (37.9 C)] 98.4 F (36.9 C) (09/14 0534) Pulse Rate:  [64-75] 64 (09/14 0534) Resp:  [18-19] 18 (09/14 0534) BP: (115-134)/(41-59) 115/56 mmHg (09/14 0534) SpO2:  [96 %-100 %] 100 % (09/14 0534) Weight:  [119 lb 3.2 oz (54.069 kg)] 119 lb 3.2 oz (54.069 kg) (09/14 0534) Weight change: 2 lb (0.907 kg)  Intake/Output from previous day: 09/13 0701 - 09/14 0700 In: 700 [P.O.:700] Out: 1400 [Urine:1400]  Intake/Output from this shift: Total I/O In: 360 [P.O.:360] Out: 150 [Urine:150]  Medications: Current Facility-Administered Medications  Medication Dose Route Frequency Provider Last Rate Last Dose  . 0.9 %  sodium chloride infusion  250 mL Intravenous PRN Brittainy Simmons, PA-C      . acetaminophen (TYLENOL) tablet 650 mg  650 mg Oral Q4H PRN Brittainy Simmons, PA-C   650 mg at 04/17/13 2357  . ALPRAZolam Prudy Feeler) tablet 0.25 mg  0.25 mg Oral BID PRN Robbie Lis, PA-C   0.25 mg at 04/13/13 2130  . aspirin EC tablet 81 mg  81 mg Oral Daily Brittainy Simmons, PA-C   81 mg at 04/17/13 1117  . docusate sodium (COLACE) capsule 200 mg  200 mg Oral Daily Nada Boozer, NP   200 mg at 04/17/13 1117  . heparin injection 5,000 Units  5,000 Units Subcutaneous Q8H Brittainy Simmons, PA-C   5,000 Units at 04/18/13 1914  . lisinopril (PRINIVIL,ZESTRIL) tablet 20 mg  20 mg Oral Daily Brittainy Simmons, PA-C   20 mg at 04/17/13 1117  . ondansetron (ZOFRAN) injection 4 mg  4 mg Intravenous Q6H PRN Brittainy Simmons, PA-C      . polyethylene glycol (MIRALAX / GLYCOLAX) packet 17 g  17 g Oral Daily PRN Nada Boozer, NP      . sodium chloride 0.9 % injection 3 mL  3 mL Intravenous Q12H Brittainy Simmons, PA-C   3 mL at 04/17/13 2156  . sodium chloride 0.9 % injection 3 mL  3 mL  Intravenous PRN Brittainy Simmons, PA-C      . zolpidem (AMBIEN) tablet 5 mg  5 mg Oral QHS PRN Robbie Lis, PA-C        Physical Exam: General appearance: alert and no distress Neck: JVD - 1 cm above sternal notch, no adenopathy, no carotid bruit, supple, symmetrical, trachea midline and thyroid not enlarged, symmetric, no tenderness/mass/nodules Lungs: clear to auscultation bilaterally Heart: regular rate and rhythm, S1, S2 normal, no murmur, click, rub or gallop Abdomen: soft, non-tender; bowel sounds normal; no masses,  no organomegaly Extremities: extremities normal, atraumatic, no cyanosis or edema Pulses: 2+ and symmetric Skin: Skin color, texture, turgor normal. No rashes or lesions Neurologic: Grossly normal  Lab Results: Results for orders placed during the hospital encounter of 04/13/13 (from the past 48 hour(s))  CBC WITH DIFFERENTIAL     Status: Abnormal   Collection Time    04/17/13  6:55 AM      Result Value Range   WBC 5.4  4.0 - 10.5 K/uL   RBC 3.46 (*) 4.22 - 5.81 MIL/uL   Hemoglobin 9.3 (*) 13.0 - 17.0 g/dL   HCT 78.2 (*) 95.6 - 21.3 %   MCV 82.9  78.0 - 100.0 fL   MCH 26.9  26.0 - 34.0 pg   MCHC 32.4  30.0 - 36.0 g/dL   RDW 45.4 (*) 09.8 - 11.9 %   Platelets 207  150 - 400 K/uL   Neutrophils Relative % 65  43 - 77 %   Neutro Abs 3.5  1.7 - 7.7 K/uL   Lymphocytes Relative 20  12 - 46 %   Lymphs Abs 1.1  0.7 - 4.0 K/uL   Monocytes Relative 12  3 - 12 %   Monocytes Absolute 0.6  0.1 - 1.0 K/uL   Eosinophils Relative 2  0 - 5 %   Eosinophils Absolute 0.1  0.0 - 0.7 K/uL   Basophils Relative 1  0 - 1 %   Basophils Absolute 0.0  0.0 - 0.1 K/uL  OCCULT BLOOD X 1 CARD TO LAB, STOOL     Status: None   Collection Time    04/17/13  6:10 PM      Result Value Range   Fecal Occult Bld NEGATIVE  NEGATIVE  CBC WITH DIFFERENTIAL     Status: Abnormal   Collection Time    04/18/13  5:00 AM      Result Value Range   WBC 6.3  4.0 - 10.5 K/uL   RBC 3.29 (*) 4.22  - 5.81 MIL/uL   Hemoglobin 8.9 (*) 13.0 - 17.0 g/dL   HCT 14.7 (*) 82.9 - 56.2 %   MCV 83.0  78.0 - 100.0 fL   MCH 27.1  26.0 - 34.0 pg   MCHC 32.6  30.0 - 36.0 g/dL   RDW 13.0 (*) 86.5 - 78.4 %   Platelets 184  150 - 400 K/uL   Neutrophils Relative % 61  43 - 77 %   Neutro Abs 3.9  1.7 - 7.7 K/uL   Lymphocytes Relative 27  12 - 46 %   Lymphs Abs 1.7  0.7 - 4.0 K/uL   Monocytes Relative 8  3 - 12 %   Monocytes Absolute 0.5  0.1 - 1.0 K/uL   Eosinophils Relative 3  0 - 5 %   Eosinophils Absolute 0.2  0.0 - 0.7 K/uL   Basophils Relative 1  0 - 1 %   Basophils Absolute 0.0  0.0 - 0.1 K/uL  PRO B NATRIURETIC PEPTIDE     Status: Abnormal   Collection Time    04/18/13  5:55 AM      Result Value Range   Pro B Natriuretic peptide (BNP) 6223.0 (*) 0 - 450 pg/mL    Imaging: No results found.  Assessment:  1. Principal Problem: 2.   Acute combined systolic and diastolic CHF, NYHA class 3, EF 30-35%, grade I diastolic dysfunction, PA pk pressure 62 mmHg  3. Active Problems: 4.   HTN (hypertension) 5.   DM (diabetes mellitus) 6.   Cardiac pacemaker in situ, Biotronik 7.   Pulmonary hypertension 8.   Cardiomyopathy, dilated 9.   Plan:  1. Discussed cath with his wife (over the phone) and him today and he acknowledged informed consent for the procedure. Will give additional po lasix today - BNP coming down, but still in the ~6000 range. Plan [facility] tomorrow with Dr. Herbie Baltimore.   Time Spent Directly with Patient:  15 minutes  Length of Stay:  LOS: 5 days   Chrystie Nose, MD, [facility] Attending Cardiologist [facility]  Yamila Cragin C 04/18/2013, 9:24 AM

## 2013-04-19 ENCOUNTER — Encounter (HOSPITAL_COMMUNITY)
Admission: EM | Disposition: A | Discharge status: Home / Self Care | Payer: Self-pay | Source: Home / Self Care | Attending: Cardiovascular Disease

## 2013-04-19 HISTORY — PX: LEFT AND RIGHT HEART CATHETERIZATION WITH CORONARY ANGIOGRAM: SHX5449

## 2013-04-19 LAB — BASIC METABOLIC PANEL
BUN: 19 mg/dL (ref 6–23)
CO2: 30 mEq/L (ref 19–32)
Chloride: 100 mEq/L (ref 96–112)
Creatinine, Ser: 0.81 mg/dL (ref 0.50–1.35)
GFR calc Af Amer: >90 mL/min (ref >90)
Potassium: 4 mEq/L (ref 3.5–5.1)

## 2013-04-19 LAB — POCT I-STAT 3, ART BLOOD GAS (G3+)
Acid-Base Excess: 6 mmol/L — ABNORMAL HIGH (ref 0.0–2.0)
Bicarbonate: 31 mEq/L — ABNORMAL HIGH (ref 20.0–24.0)
TCO2: 32 mmol/L (ref 0–100)

## 2013-04-19 LAB — POCT I-STAT 3, VENOUS BLOOD GAS (G3P V)
Acid-Base Excess: 6 mmol/L — ABNORMAL HIGH (ref 0.0–2.0)
pH, Ven: 7.372 — ABNORMAL HIGH (ref 7.250–7.300)
pO2, Ven: 31 mmHg (ref 30.0–45.0)

## 2013-04-19 SURGERY — LEFT AND RIGHT HEART CATHETERIZATION WITH CORONARY ANGIOGRAM
Anesthesia: LOCAL

## 2013-04-19 MED ORDER — ACETAMINOPHEN 325 MG PO TABS: 650.0000 mg | ORAL_TABLET | ORAL | Status: DC | PRN

## 2013-04-19 MED ORDER — HEPARIN (PORCINE) IN NACL 2-0.9 UNIT/ML-% IJ SOLN
INTRAMUSCULAR | Status: AC
  Filled 2013-04-19: qty 1000

## 2013-04-19 MED ORDER — SODIUM CHLORIDE 0.9 % IV SOLN
INTRAVENOUS | Status: DC
  Administered 2013-04-19 (×2): via INTRAVENOUS

## 2013-04-19 MED ORDER — ONDANSETRON HCL 4 MG/2ML IJ SOLN: 4.0000 mg | Freq: Four times a day (QID) | INTRAMUSCULAR | Status: DC | PRN

## 2013-04-19 MED ORDER — LIDOCAINE HCL (PF) 1 % IJ SOLN
INTRAMUSCULAR | Status: AC
  Filled 2013-04-19: qty 30

## 2013-04-19 MED ORDER — FENTANYL CITRATE 0.05 MG/ML IJ SOLN
INTRAMUSCULAR | Status: AC
  Filled 2013-04-19: qty 2

## 2013-04-19 MED ORDER — NITROGLYCERIN 0.2 MG/ML ON CALL CATH LAB
INTRAVENOUS | Status: AC
  Filled 2013-04-19: qty 1

## 2013-04-19 MED ORDER — MIDAZOLAM HCL 2 MG/2ML IJ SOLN
INTRAMUSCULAR | Status: AC
  Filled 2013-04-19: qty 2

## 2013-04-19 NOTE — CV Procedure (Signed)
Frederick [name] is a 79 y.o. male    [id number]  [id number] LOCATION:  FACILITY: [facility]  PHYSICIAN: Lennette Bihari, MD, [facility] [birth year]   DATE OF PROCEDURE:  04/19/2013    [facility]  CARDIAC CATHETERIZATION     HISTORY:  [name] is a 79 year old African American male who was admitted with increasing episodes of worsening shortness of breath as well as leg swelling. Patient has noticed intermittent midepigastric and substernal chest pain. 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 Echo Doppler study has shown an ejection fraction of approximately 30-35% with diffuse severe hypokinesis with wall motion being more pronounced inferolaterally. He now presents for right and left heart catheterization.   PROCEDURE:  The patient was brought to the second floor [facility] cath lab in the postabsorptive state. He received Versed 1 mg and fentanyl 25 mcg for conscious sedation. His right femoral artery and femoral veins were then accessed and a 5 French arterial sheath and 7 French venous sheath were inserted without difficulty. A Swan-Ganz catheter was advanced through the venous sheath and pressures were recorded in the RA, RV, PA, and pulmonary capillary wedge positions. A 5 French pigtail catheter was advanced into the central aorta and simultaneous central aortic and pulmonary artery pressures were recorded. O2 saturations were obtained in the PA and AO for assumed Fick cardiac output determinations. Thermodilution catheter cardiac output was also obtained. The pigtail catheter was then advanced into the left ventricle. Simultaneous LV and Pc wedge pressures were recorded. Left ventriculography was performed in the RAO projection. The cath was then pulled back into the aorta. Attention was then directed at the coronary arteries and diagnostic catheter catheterization  was done utilizing 5 French Judkins 4 left, right, and a no torque right catheter. Hemostasis was obtained by direct manual pressure. The patient tolerated the procedure well.   HEMODYNAMICS:   RA: 8/7 RV: 35/6 PA:36/16 Pc: 15  AO: 122/60 LV: 122/11/17  Cardiac Output: 3.8 l/m (thermodilution); 4.7 l/m (Fick) Cardiac Index:    2.4l/m/m2                       2.9l/m/m2     ANGIOGRAPHY:  1. Left main: Angiographically normal it gave rise to the LAD and left circumflex system. 2. LAD: Angiographically normal 3. Left circumflex: Immediately gave rise to a was a ramus intermediate like vessel and a high circumflex marginal vessel. The circumflex and its branches were normal. 4. Right coronary artery: Angiographically normal vessel the small PDA system but more prominent PLA system.  5. left ventriculography revealed a dilated left ventricle with left ventricular hypertrophy. There is severe LV dysfunction with an ejection fraction of 20-25%. There did appear to be more pronounced basal inferior hypocontractility relative to the other walls.      IMPRESSION:  Severe nonischemic cardiomyopathy Essentially normal coronary arteries without significant obstructive disease  RECOMMENDATION:  Medical Therapy     Lennette Bihari, MD, [facility] 04/19/2013 12:01 PM

## 2013-04-19 NOTE — Progress Notes (Signed)
[facility]  Subjective: No complaints. Denies SOB and CP. Notes significant improvement in LEE. He had 2 BMs today. Still notes melanotic stools.   Objective: Vital signs in last 24 hours: Temp:  [97.7 F (36.5 C)-99.3 F (37.4 C)] 99 F (37.2 C) (09/15 0517) Pulse Rate:  [71-72] 71 (09/15 0517) Resp:  [16-20] 16 (09/15 0517) BP: (107-144)/(40-56) 116/50 mmHg (09/15 0517) SpO2:  [97 %-99 %] 98 % (09/15 0517) Weight:  [119 lb 3.2 oz (54.069 kg)-119 lb 14.9 oz (54.4 kg)] 119 lb 14.9 oz (54.4 kg) (09/15 0517) Last BM Date: 04/19/13  Intake/Output from previous day: 09/14 0701 - 09/15 0700 In: 760 [P.O.:760] Out: 1050 [Urine:1050] Intake/Output this shift: Total I/O In: 0  Out: 100 [Urine:100]  Medications Current Facility-Administered Medications  Medication Dose Route Frequency Provider Last Rate Last Dose  . 0.9 %  sodium chloride infusion  250 mL Intravenous PRN Brittainy Simmons, PA-C      . 0.9 %  sodium chloride infusion  250 mL Intravenous PRN Chrystie Nose, MD      . 0.9 %  sodium chloride infusion  1 mL/kg/hr Intravenous Continuous Chrystie Nose, MD 54.1 mL/hr at 04/19/13 0456 1 mL/kg/hr at 04/19/13 0456  . acetaminophen (TYLENOL) tablet 650 mg  650 mg Oral Q4H PRN Brittainy Simmons, PA-C   650 mg at 04/17/13 2357  . ALPRAZolam Prudy Feeler) tablet 0.25 mg  0.25 mg Oral BID PRN Robbie Lis, PA-C   0.25 mg at 04/13/13 2130  . [START ON 04/20/2013] aspirin EC tablet 81 mg  81 mg Oral Daily Runell Gess, MD      . docusate sodium (COLACE) capsule 200 mg  200 mg Oral Daily Nada Boozer, NP   200 mg at 04/18/13 1008  . heparin injection 5,000 Units  5,000 Units Subcutaneous Q8H Brittainy Simmons, PA-C   5,000 Units at 04/19/13 0603  . lisinopril (PRINIVIL,ZESTRIL) tablet 20 mg  20 mg Oral Daily Brittainy Simmons, PA-C   20 mg at 04/18/13 1008  . ondansetron (ZOFRAN) injection 4 mg  4 mg Intravenous Q6H PRN Brittainy Simmons, PA-C       . polyethylene glycol (MIRALAX / GLYCOLAX) packet 17 g  17 g Oral Daily PRN Nada Boozer, NP      . sodium chloride 0.9 % injection 3 mL  3 mL Intravenous Q12H Brittainy Simmons, PA-C   3 mL at 04/18/13 1008  . sodium chloride 0.9 % injection 3 mL  3 mL Intravenous PRN Brittainy Simmons, PA-C      . sodium chloride 0.9 % injection 3 mL  3 mL Intravenous Q12H Chrystie Nose, MD   3 mL at 04/18/13 2259  . sodium chloride 0.9 % injection 3 mL  3 mL Intravenous PRN Chrystie Nose, MD      . zolpidem (AMBIEN) tablet 5 mg  5 mg Oral QHS PRN Robbie Lis, PA-C        PE: General appearance: alert, cooperative and no distress Lungs: clear to auscultation bilaterally Heart: regular rate and rhythm, S1, S2 normal, no murmur, click, rub or gallop Extremities: no LEE Pulses: 2+ and symmetric Skin: warm and dry Neurologic: Grossly normal  Lab Results:   Recent Labs  04/17/13 0655 04/18/13 0500  WBC 5.4 6.3  HGB 9.3* 8.9*  HCT 28.7* 27.3*  PLT 207 184   BMET No results found for this basename: NA, K, CL, CO2, GLUCOSE, BUN, CREATININE, CALCIUM,  in the last 72  hours PT/INR  Recent Labs  04/18/13 1100  LABPROT 14.1  INR 1.11   Filed Weights   04/18/13 0534 04/18/13 1006 04/19/13 0517  Weight: 119 lb 3.2 oz (54.069 kg) 119 lb 3.2 oz (54.069 kg) 119 lb 14.9 oz (54.4 kg)   BNP (last 3 results)  Recent Labs  04/15/13 0600 04/16/13 0500 04/18/13 0555  PROBNP 17133.0* 8869.0* 6223.0*     Assessment/Plan  Principal Problem:   Acute combined systolic and diastolic CHF, NYHA class 3, EF 30-35%, grade I diastolic dysfunction, PA pk pressure 62 mmHg  Active Problems:   HTN (hypertension)   DM (diabetes mellitus)   Cardiac pacemaker in situ, Biotronik   Pulmonary hypertension   Cardiomyopathy, dilated  Plan: Plan for [facility] today to assess volume status, pulmonary pressures and coronary anatomy, to define cardiomyopathy as ischemic vs nonischemic. If PCI with stenting is  to be performed, will need to consider DES vs BMS. Pt has had serial decreases in H/H since admission. H/H today is 8.9/27.3, which is down from 10.2/30.8 on admission. ? GI bleed. However, FOBT 2 days ago was negative. It is unlikely that anemia is secondary to renal failure, as he has had SCr levels WNL since admission. No BNP however in 2 days. Will order today for cath. Hold am dose of PO Lasix.  INR is WNL. HR and BP both stable. He has been NPO since midnight. Consent for cath has been signed. MD to follow.     LOS: 6 days    Brittainy M. Delmer Islam 04/19/2013 8:32 AM   Patient seen and examined. Agree with assessment and plan. Pt with a cardiomyopathy with EF 30 - 35% on echo with diffuse hypokinesis but more pronounced inferolateral wall motion abnormalities. Discussed R and L heart cath with patient as well as wife(on the phone) today. Plan cath today. F/U Hb. Stool guaiacs negative .   Lennette Bihari, MD, [facility] 04/19/2013 10:13 AM

## 2013-04-20 LAB — CBC WITH DIFFERENTIAL/PLATELET
Eosinophils Absolute: 0.3 10*3/uL (ref 0.0–0.7)
Lymphocytes Relative: 33 % (ref 12–46)
Lymphs Abs: 1.9 10*3/uL (ref 0.7–4.0)
Neutrophils Relative %: 54 % (ref 43–77)
Platelets: 179 10*3/uL (ref 150–400)
RBC: 3.47 MIL/uL — ABNORMAL LOW (ref 4.22–5.81)
WBC: 5.7 10*3/uL (ref 4.0–10.5)

## 2013-04-20 LAB — BASIC METABOLIC PANEL
CO2: 30 mEq/L (ref 19–32)
Calcium: 8.3 mg/dL — ABNORMAL LOW (ref 8.4–10.5)
Creatinine, Ser: 0.85 mg/dL (ref 0.50–1.35)
GFR calc Af Amer: >90 mL/min (ref >90)
Glucose, Bld: 117 mg/dL — ABNORMAL HIGH (ref 70–99)
Potassium: 4.3 mEq/L (ref 3.5–5.1)
Sodium: 139 mEq/L (ref 135–145)

## 2013-04-20 MED ORDER — CARVEDILOL 3.125 MG PO TABS
3.1250 mg | ORAL_TABLET | Freq: Two times a day (BID) | ORAL | Status: DC
  Administered 2013-04-20 – 2013-04-21 (×2): 3.125 mg via ORAL
  Filled 2013-04-20 (×4): qty 1

## 2013-04-20 NOTE — Progress Notes (Signed)
CARDIAC REHAB PHASE I   PRE:  Rate/Rhythm: 71SR  BP:  Supine:   Sitting: 121/65  Standing:    SaO2: 98%RA  MODE:  Ambulation: 340 ft   POST:  Rate/Rhythm: 79  BP:  Supine: 163/70  Sitting:   Standing:    SaO2: 97%RA 1405-1440 Pt walked 340 ft on RA with rolling walker and asst x 1 with fairly steady gait. Tired after walk and wanted to go to bed. Pt assisted to bed. Discussed with pt weighing every day and when to call MD with weight gain. Pt stated he did not have scales but would get some. Has CHF booklet.   Luetta Nutting, RN BSN  04/20/2013 2:38 PM

## 2013-04-20 NOTE — Progress Notes (Signed)
Chart review complete.  Patient is not eligible for [facility] Care Management services because his/her PCP is not a [facility] primary care provider or is not [facility] affiliated.  For any additional questions or new referrals please contact Tim Henderson BSN RN [facility] Liaison at [phone] °

## 2013-04-20 NOTE — Progress Notes (Signed)
Occupational Therapy Evaluation Patient Details Name: Frederick [name] MRN: [id number] DOB: 1932/04/15 Today's Date: 04/20/2013 Time: 2130-8657 OT Time Calculation (min): 35 min  OT Assessment / Plan / Recommendation History of present illness pt rpesents with CP and SOB, now s/p Cardiac Cath.     Clinical Impression   PTA pt mod I with ADL and mobility. Pt had just begun to use his wife's RW @ 2weeks and staes he was primarily responsible for cooking, etc. States his wife has a caretaker 3x/wk. Pt will benefit from [facility] at D/C. During functional activities, pt's O@ >95, however, dyspnea 2/4. Pt will benefit from skilled OT services to facilitate D/C to next venue due to below deficits.     OT Assessment  Patient needs continued OT Services    Follow Up Recommendations  Home health OT    Barriers to Discharge   Wife is elderly and has caretaker 3 days/wk who assists with bathing and cleaning.   Equipment Recommendations  3 in 1 bedside comode;Other (comment) (RW)    Recommendations for Other Services  Rec nsg ambulate pt frequently  Frequency  Min 2X/week    Precautions / Restrictions Precautions Precautions: Fall Restrictions Weight Bearing Restrictions: No   Pertinent Vitals/Pain no apparent distress     ADL  Grooming: Supervision/safety Where Assessed - Grooming: Unsupported standing Upper Body Bathing: Supervision/safety;Set up Where Assessed - Upper Body Bathing: Unsupported sitting Lower Body Bathing: Min guard Where Assessed - Lower Body Bathing: Supported sit to stand Upper Body Dressing: Supervision/safety;Set up Where Assessed - Upper Body Dressing: Unsupported sitting Lower Body Dressing: Min guard Where Assessed - Lower Body Dressing: Supported sit to stand Toilet Transfer: Minimal assistance Toilet Transfer Method: Other (comment) (ambulating) Toilet Transfer Equipment: Bedside commode;Regular height toilet Toileting - Clothing Manipulation and Hygiene:  Supervision/safety Where Assessed - Toileting Clothing Manipulation and Hygiene: Sit to stand from 3-in-1 or toilet Equipment Used: Gait belt;Rolling walker Transfers/Ambulation Related to ADLs: min assist - initial posterior lean ADL Comments: unsteady at times during bathroom mobility. Safer with RW    OT Diagnosis:    OT Problem List: Decreased strength;Decreased activity tolerance;Decreased safety awareness;Decreased knowledge of use of DME or AE;Cardiopulmonary status limiting activity OT Treatment Interventions: Self-care/ADL training;Therapeutic exercise;Energy conservation;DME and/or AE instruction;Therapeutic activities;Patient/family education   OT Goals(Current goals can be found in the care plan section) Acute Rehab OT Goals Patient Stated Goal: Home OT Goal Formulation: With patient Time For Goal Achievement: 05/04/13 Potential to Achieve Goals: Good  Visit Information  Last OT Received On: 04/20/13 Assistance Needed: +1 History of Present Illness: pt rpesents with CP and SOB, now s/p Cardiac Cath.         Prior Functioning     Home Living Family/patient expects to be discharged to:: Private residence Living Arrangements: Spouse/significant other Available Help at Discharge: Family;Available PRN/intermittently Type of Home: House Home Access: Level entry Home Layout: One level Home Equipment: Tub bench Additional Comments: pt notes he has been starting to use his wife's RW, but that she uses it as well.  Also states wife has hired help to A her, but pt also states wife does all cooking and cleaning.   Prior Function Level of Independence: Needs assistance Gait / Transfers Assistance Needed: started using a walker @ 2 weeks PTA ADL's / Homemaking Assistance Needed: Pt does the cooking Communication Communication: No difficulties         Vision/Perception Vision - History Baseline Vision: Wears glasses only for reading   Cognition  Cognition Arousal/Alertness: Awake/alert Behavior During Therapy: WFL for tasks assessed/performed Overall Cognitive Status: Within Functional Limits for tasks assessed    Extremity/Trunk Assessment Upper Extremity Assessment Upper Extremity Assessment: Generalized weakness Lower Extremity Assessment Lower Extremity Assessment: Generalized weakness Cervical / Trunk Assessment Cervical / Trunk Assessment: Kyphotic     Mobility Bed Mobility Bed Mobility: Supine to Sit;Sitting - Scoot to Edge of Bed Supine to Sit: 5: Supervision;With rails Sitting - Scoot to Edge of Bed: 5: Supervision Details for Bed Mobility Assistance: Needs increased time and bed rail to complete.   Transfers Sit to Stand: 4: Min guard;With upper extremity assist;From bed Stand to Sit: 4: Min guard;With upper extremity assist;To bed Details for Transfer Assistance: cues for UE use and controlling descent to sitting.       Exercise     Balance Balance Balance Assessed: No   End of Session OT - End of Session Equipment Utilized During Treatment: Gait belt Activity Tolerance: Patient tolerated treatment well Patient left: in chair Nurse Communication: Mobility status  GO     Berenise Hunton,HILLARY 04/20/2013, 5:31 PM [facility], OTR/L  [phone] 04/20/2013

## 2013-04-20 NOTE — Progress Notes (Signed)
Pt a/o, no c/o pain, pt oob with assist x 1 and walker, pts right femoral site is level 0, will continue to monitor

## 2013-04-20 NOTE — Evaluation (Signed)
Physical Therapy Evaluation Patient Details Name: Frederick [name] MRN: [id number] DOB: [birth year] Today's Date: 04/20/2013 Time: 3086-5784 PT Time Calculation (min): 14 min  PT Assessment / Plan / Recommendation History of Present Illness  pt rpesents with CP and SOB, now s/p Cardiac Cath.    Clinical Impression  Pt admitted with CP and SOB, s/p Cardiac Cath. Pt currently with functional limitations due to the deficits listed below (see PT Problem List).  Pt will benefit from skilled PT to increase their independence and safety with mobility to allow discharge to the venue listed below.      PT Assessment  Patient needs continued PT services    Follow Up Recommendations  Home health PT;Supervision - Intermittent    Does the patient have the potential to tolerate intense rehabilitation      Barriers to Discharge Decreased caregiver support Unclear how much A pt will have at home.      Equipment Recommendations  Rolling walker with 5" wheels;3in1 (PT)    Recommendations for Other Services OT consult   Frequency Min 3X/week    Precautions / Restrictions Precautions Precautions: Fall Restrictions Weight Bearing Restrictions: No   Pertinent Vitals/Pain Indicates not painful, just stiff.        Mobility  Bed Mobility Bed Mobility: Supine to Sit;Sitting - Scoot to Edge of Bed Supine to Sit: 5: Supervision;With rails Sitting - Scoot to Edge of Bed: 5: Supervision Details for Bed Mobility Assistance: Needs increased time and bed rail to complete.   Transfers Transfers: Sit to Stand;Stand to Sit Sit to Stand: 4: Min guard;With upper extremity assist;From bed Stand to Sit: 4: Min guard;With upper extremity assist;To bed Details for Transfer Assistance: cues for UE use and controlling descent to sitting.   Ambulation/Gait Ambulation/Gait Assistance: 4: Min guard Ambulation Distance (Feet): 160 Feet Assistive device: Rolling walker Ambulation/Gait Assistance Details: cues for  upright posture and encouragement.   Gait Pattern: Step-through pattern;Decreased stride length;Trunk flexed Stairs: No Wheelchair Mobility Wheelchair Mobility: No    Exercises     PT Diagnosis: Difficulty walking;Generalized weakness  PT Problem List: Decreased strength;Decreased activity tolerance;Decreased balance;Decreased mobility;Decreased knowledge of use of DME PT Treatment Interventions: DME instruction;Gait training;Stair training;Functional mobility training;Therapeutic activities;Balance training;Therapeutic exercise;Patient/family education     PT Goals(Current goals can be found in the care plan section) Acute Rehab PT Goals Patient Stated Goal: Home PT Goal Formulation: With patient Time For Goal Achievement: 05/04/13 Potential to Achieve Goals: Good  Visit Information  Last PT Received On: 04/20/13 Assistance Needed: +1 History of Present Illness: pt rpesents with CP and SOB, now s/p Cardiac Cath.         Prior Functioning  Home Living Family/patient expects to be discharged to:: Private residence Living Arrangements: Spouse/significant other Available Help at Discharge: Family;Available PRN/intermittently Type of Home: House Home Access: Level entry Home Layout: One level Home Equipment: Tub bench Additional Comments: pt notes he has been starting to use his wife's RW, but that she uses it as well.  Also states wife has hired help to A her, but pt also states wife does all cooking and cleaning.   Prior Function Level of Independence: Needs assistance ADL's / Homemaking Assistance Needed: Per pt wife does all cooking and cleaning.   Communication Communication: No difficulties    Cognition  Cognition Arousal/Alertness: Awake/alert Behavior During Therapy: WFL for tasks assessed/performed Overall Cognitive Status: Within Functional Limits for tasks assessed    Extremity/Trunk Assessment Upper Extremity Assessment Upper Extremity Assessment: Defer to OT  evaluation Lower Extremity Assessment Lower Extremity Assessment: Generalized weakness Cervical / Trunk Assessment Cervical / Trunk Assessment: Kyphotic   Balance Balance Balance Assessed: No  End of Session PT - End of Session Equipment Utilized During Treatment: Gait belt Activity Tolerance: Patient tolerated treatment well Patient left: in bed;with call bell/phone within reach (sitting EOB) Nurse Communication: Mobility status  GP     Sunny Schlein, PT [phone] 04/20/2013, 10:55 AM

## 2013-04-20 NOTE — Progress Notes (Signed)
Pt. Seen and examined. Agree with the NP/PA-C note as written.  He is euvolemic based on cath numbers yesterday. No obstructive CAD. He is on ACE-I.  Agree with adding low-dose coreg 3.125 BID - can uptitrate as tolerated later. LifeVest is a good idea until we can determine if he will improve with medical therapy or he will need an upgrade of his PPM to an AICD. I would not add aldactone at this time due to low right heart pressures.   Chrystie Nose, MD, [facility] Attending Cardiologist [facility]

## 2013-04-20 NOTE — Progress Notes (Signed)
[facility]  Subjective: No further SOB. He denies right groin, back or flank pain.  Objective: Vital signs in last 24 hours: Temp:  [98.6 F (37 C)-99.4 F (37.4 C)] 99 F (37.2 C) (09/16 0542) Pulse Rate:  [65-76] 72 (09/16 0542) Resp:  [16-18] 16 (09/16 0542) BP: (124-153)/(41-100) 124/52 mmHg (09/16 0542) SpO2:  [96 %-98 %] 98 % (09/16 0542) Weight:  [123 lb 0.3 oz (55.8 kg)] 123 lb 0.3 oz (55.8 kg) (09/16 0542) Last BM Date: 04/19/13  Intake/Output from previous day: 09/15 0701 - 09/16 0700 In: 240 [P.O.:240] Out: 776 [Urine:775; Stool:1] Intake/Output this shift: Total I/O In: 200 [P.O.:200] Out: -   Medications Current Facility-Administered Medications  Medication Dose Route Frequency Provider Last Rate Last Dose  . 0.9 %  sodium chloride infusion  250 mL Intravenous PRN Terion Hedman, PA-C      . 0.9 %  sodium chloride infusion   Intravenous Continuous Lennette Bihari, MD      . acetaminophen (TYLENOL) tablet 650 mg  650 mg Oral Q4H PRN Montana Bryngelson, PA-C   650 mg at 04/17/13 2357  . ALPRAZolam Prudy Feeler) tablet 0.25 mg  0.25 mg Oral BID PRN Robbie Lis, PA-C   0.25 mg at 04/13/13 2130  . aspirin EC tablet 81 mg  81 mg Oral Daily Runell Gess, MD   81 mg at 04/20/13 1610  . docusate sodium (COLACE) capsule 200 mg  200 mg Oral Daily Nada Boozer, NP   200 mg at 04/20/13 0907  . heparin injection 5,000 Units  5,000 Units Subcutaneous Q8H Gailya Tauer, PA-C   5,000 Units at 04/20/13 9604  . lisinopril (PRINIVIL,ZESTRIL) tablet 20 mg  20 mg Oral Daily Alishba Naples, PA-C   20 mg at 04/20/13 5409  . ondansetron (ZOFRAN) injection 4 mg  4 mg Intravenous Q6H PRN Lennette Bihari, MD      . polyethylene glycol (MIRALAX / GLYCOLAX) packet 17 g  17 g Oral Daily PRN Nada Boozer, NP      . sodium chloride 0.9 % injection 3 mL  3 mL Intravenous Q12H Anarie Kalish, PA-C   3 mL at 04/20/13 0908  . sodium chloride 0.9 %  injection 3 mL  3 mL Intravenous PRN Khyson Sebesta, PA-C      . zolpidem (AMBIEN) tablet 5 mg  5 mg Oral QHS PRN Robbie Lis, PA-C        PE: General appearance: alert, cooperative and no distress Lungs: clear to auscultation bilaterally Heart: regular rate and rhythm Extremities: no LEE, right groin: soft, no ecchymosis, non tender no bruit or hematoma  Pulses: 2+ and symmetric Skin: warm and dry Neurologic: Grossly normal  Lab Results:   Recent Labs  04/18/13 0500 04/20/13 0800  WBC 6.3 5.7  HGB 8.9* 9.3*  HCT 27.3* 29.1*  PLT 184 179   BMET  Recent Labs  04/19/13 1200 04/20/13 0800  NA 136 139  K 4.0 4.3  CL 100 102  CO2 30 30  GLUCOSE 87 117*  BUN 19 18  CREATININE 0.81 0.85  CALCIUM 8.2* 8.3*   PT/INR  Recent Labs  04/18/13 1100  LABPROT 14.1  INR 1.11   Studies/Results:  LHC 04/20/13 ANGIOGRAPHY:  1. Left main: Angiographically normal it gave rise to the LAD and left circumflex system.  2. LAD: Angiographically normal  3. Left circumflex: Immediately gave rise to a was a ramus intermediate like vessel and a high circumflex marginal vessel.  The circumflex and its branches were normal.  4. Right coronary artery: Angiographically normal vessel the small PDA system but more prominent PLA system.  5. left ventriculography revealed a dilated left ventricle with left ventricular hypertrophy. There is severe LV dysfunction with an ejection fraction of 20-25%. There did appear to be more pronounced basal inferior hypocontractility relative to the other walls.  IMPRESSION:  Severe nonischemic cardiomyopathy  Essentially normal coronary arteries without significant obstructive disease  RECOMMENDATION:  Medical Therapy     Assessment/Plan  Principal Problem:   Acute combined systolic and diastolic CHF, NYHA class 3, EF 30-35%, grade I diastolic dysfunction, PA pk pressure 62 mmHg  Active Problems:   HTN (hypertension)   DM (diabetes mellitus)    Cardiac pacemaker in situ, Biotronik   Pulmonary hypertension   Cardiomyopathy, dilated  Plan: LHC yesterday revealed normal coronary arteries. He has severe nonischemic cardiomyopathy, with an EF of 20-25%. Will need to place on appropriate HF meds. He is already on an ACE-I. Will need to initiate a BB now that he is near euvolemic. Also condsider Hydralazine/Nitrate and Spironolactone. He is also at increased risk for SCD.  Would recommend LifeVest with repeat 2D echo after 3 months on optimal medication. If systolic function is still < 35%, he will need an ICD. The right femoral access site is stable. No chest pain or SOB. Peripheral edema has resolved. H/H is trending back upwards. HR and BP both stable. MD to follow.     LOS: 7 days    Izek Corvino M. Delmer Islam 04/20/2013 9:17 AM

## 2013-04-21 LAB — BASIC METABOLIC PANEL
CO2: 27 mEq/L (ref 19–32)
Chloride: 101 mEq/L (ref 96–112)
Glucose, Bld: 114 mg/dL — ABNORMAL HIGH (ref 70–99)
Potassium: 4.5 mEq/L (ref 3.5–5.1)
Sodium: 136 mEq/L (ref 135–145)

## 2013-04-21 LAB — CBC WITH DIFFERENTIAL/PLATELET
Hemoglobin: 8.7 g/dL — ABNORMAL LOW (ref 13.0–17.0)
Lymphocytes Relative: 29 % (ref 12–46)
Lymphs Abs: 1.6 10*3/uL (ref 0.7–4.0)
Neutrophils Relative %: 51 % (ref 43–77)
Platelets: 190 10*3/uL (ref 150–400)
RBC: 3.13 MIL/uL — ABNORMAL LOW (ref 4.22–5.81)
WBC: 5.8 10*3/uL (ref 4.0–10.5)

## 2013-04-21 MED ORDER — FUROSEMIDE 20 MG PO TABS: 20.0000 mg | ORAL_TABLET | Freq: Every day | ORAL | Status: DC

## 2013-04-21 MED ORDER — CARVEDILOL 3.125 MG PO TABS: 3.1250 mg | ORAL_TABLET | Freq: Two times a day (BID) | ORAL | Status: AC

## 2013-04-21 MED ORDER — ASPIRIN 81 MG PO TBEC: 81.0000 mg | DELAYED_RELEASE_TABLET | Freq: Every day | ORAL | Status: AC

## 2013-04-21 NOTE — Progress Notes (Signed)
04/20/13 Pt.has had no c/o pain or signs of distress. He is 1 person assist to the bathroom and also uses urinal. He is on room air. Right groin site is Level 0.

## 2013-04-21 NOTE — Progress Notes (Signed)
CARDIAC REHAB PHASE I   PRE:  Rate/Rhythm: 67 SR    BP: sitting 98/50    SaO2:   MODE:  Ambulation: 260 ft   POST:  Rate/Rhythm: 78 SR    BP: sitting 140/60     SaO2: 96 RA  Pt sts he is more SOB walking today. Had to rest on return trip. Still c/o SOB after walk. BP increased substantially with walking. Pt requests a RW for home. Sts he has been using his wife's.  1610-9604   Elissa Lovett [place] CES, ACSM 04/21/2013 10:44 AM

## 2013-04-21 NOTE — Progress Notes (Signed)
Pt fitted for life vest 04/20/13.  Vest and battery pack in room.  Pt stated he understands how to wear vest and charge batteries and to only remove when he showers.  Stated he knows how to charge batteries and importance of keeping batteries charged.  He viewed video and stated understanding of information via teach back.  Viewed Heart Failure video this past weekend when I was his nurse.  Stated understanding of weighing daily after bladder emptied and recording, limiting salt intake and fluids and taking meds as prescribed.  Stated understanding of zone tool.  Awaiting MD rounding today then probable discharge to home.

## 2013-04-21 NOTE — Progress Notes (Signed)
[facility]  Subjective: Feels good. No complaints. Ready for discharge.   Objective: Vital signs in last 24 hours: Temp:  [97.3 F (36.3 C)-98.6 F (37 C)] 98.3 F (36.8 C) (09/17 0553) Pulse Rate:  [64-76] 76 (09/17 0553) Resp:  [16-18] 18 (09/17 0553) BP: (118-126)/(43-65) 126/47 mmHg (09/17 0553) SpO2:  [97 %-99 %] 99 % (09/17 0553) Weight:  [124 lb 5.4 oz (56.4 kg)] 124 lb 5.4 oz (56.4 kg) (09/17 0553) Last BM Date: 04/20/13  Intake/Output from previous day: 09/16 0701 - 09/17 0700 In: 683 [P.O.:680; I.V.:3] Out: 450 [Urine:450] Intake/Output this shift: Total I/O In: -  Out: 150 [Urine:150]  Medications Current Facility-Administered Medications  Medication Dose Route Frequency Provider Last Rate Last Dose  . 0.9 %  sodium chloride infusion  250 mL Intravenous PRN Shanyce Daris, PA-C      . acetaminophen (TYLENOL) tablet 650 mg  650 mg Oral Q4H PRN Georgene Kopper, PA-C   650 mg at 04/17/13 2357  . ALPRAZolam Prudy Feeler) tablet 0.25 mg  0.25 mg Oral BID PRN Robbie Lis, PA-C   0.25 mg at 04/13/13 2130  . aspirin EC tablet 81 mg  81 mg Oral Daily Runell Gess, MD   81 mg at 04/20/13 0981  . carvedilol (COREG) tablet 3.125 mg  3.125 mg Oral BID WC Chrystie Nose, MD   3.125 mg at 04/21/13 0556  . docusate sodium (COLACE) capsule 200 mg  200 mg Oral Daily Nada Boozer, NP   200 mg at 04/20/13 0907  . heparin injection 5,000 Units  5,000 Units Subcutaneous Q8H Tyberius Ryner, PA-C   5,000 Units at 04/21/13 0556  . lisinopril (PRINIVIL,ZESTRIL) tablet 20 mg  20 mg Oral Daily Shooter Tangen, PA-C   20 mg at 04/20/13 1914  . ondansetron (ZOFRAN) injection 4 mg  4 mg Intravenous Q6H PRN Lennette Bihari, MD      . polyethylene glycol (MIRALAX / GLYCOLAX) packet 17 g  17 g Oral Daily PRN Nada Boozer, NP      . sodium chloride 0.9 % injection 3 mL  3 mL Intravenous Q12H Phoenix Dresser, PA-C   3 mL at 04/20/13 2252  . sodium  chloride 0.9 % injection 3 mL  3 mL Intravenous PRN Layth Cerezo, PA-C      . zolpidem (AMBIEN) tablet 5 mg  5 mg Oral QHS PRN Robbie Lis, PA-C        PE: General appearance: alert, cooperative and no distress Lungs: clear to auscultation bilaterally Heart: regular rate and rhythm Extremities: 1+ pretibial edema on the left, no edema on the right Pulses: 2+ and symmetric Skin: warm and dry Neurologic: Grossly normal  Lab Results:   Recent Labs  04/20/13 0800 04/21/13 0632  WBC 5.7 5.8  HGB 9.3* 8.7*  HCT 29.1* 25.9*  PLT 179 190   BMET  Recent Labs  04/19/13 1200 04/20/13 0800 04/21/13 0632  NA 136 139 136  K 4.0 4.3 4.5  CL 100 102 101  CO2 30 30 27   GLUCOSE 87 117* 114*  BUN 19 18 19   CREATININE 0.81 0.85 0.82  CALCIUM 8.2* 8.3* 8.1*   PT/INR  Recent Labs  04/18/13 1100  LABPROT 14.1  INR 1.11    Assessment/Plan  Principal Problem:   Acute combined systolic and diastolic CHF, NYHA class 3, EF 30-35%, grade I diastolic dysfunction, PA pk pressure 62 mmHg  Active Problems:   HTN (hypertension)   DM (diabetes mellitus)  Cardiac pacemaker in situ, Biotronik   Pulmonary hypertension   Cardiomyopathy, dilated  Plan: Low dose BB was initiated yesterday for severe systolic HF. He has tolerated the addition well. HR and BP have remained stable. Will need to continue to up titrate as an OP. Continue ACE-I. Will need to start daily PO Lasix + maintenance potassium.  ? 20 vs 40 mg of Lasix. He has been fitted with a LifeVest. Will need repeat 2D echo in 3 months to reassess systolic function to see if an ICD is warranted. Will arrange f/u at [facility] in 7 days. He will need to follow-up his anemia with a PCP. FOBT 2 days ago was negative. However, with anemia in a male with rencent history of unintentional weight loss, a colonoscopy may be needed. Will defer decision to PCP. Hopefully plan for discharge today after seen by MD.     LOS: 8 days     Marlisha Vanwyk M. Sharol Harness, PA-C 04/21/2013 8:22 AM

## 2013-04-21 NOTE — Progress Notes (Signed)
D/c'd for home via vehicle transport by daughter.  Stated understanding of all d/c instructions.  Life vest on and all supplies and instruction manuals sent with pt.  No voiced complaints.

## 2013-04-21 NOTE — Progress Notes (Addendum)
Pt. Seen and examined. Agree with the NP/PA-C note as written.  He looks well today. On appropriate medications for HF. Agree with maintenance dose of lasix 20 mg daily. Probably does not need standing potassium - plan re-check CBC/BMP in a few days. He will need TCM 7 close follow-up in 5-7 days in our office. Repeat echo in 3 months - if EF has not improved, may need PPM to AICD upgrade. Fitted and instructed on LifeVest. Ok for discharge home today.  Chrystie Nose, MD, [facility] Attending Cardiologist [facility]

## 2013-04-21 NOTE — Discharge Summary (Signed)
Pt. Seen and examined. Agree with the NP/PA-C note as written.  He was discharged normally - not against medical advice as was indicated above.   Chrystie Nose, MD, [facility] Attending Cardiologist [facility]

## 2013-04-23 ENCOUNTER — Telehealth: Payer: Self-pay | Admitting: Physician Assistant

## 2013-04-23 ENCOUNTER — Ambulatory Visit: Payer: Medicare Other | Admitting: Physician Assistant

## 2013-04-23 NOTE — Telephone Encounter (Signed)
8:44 AM  I tried calling the patient this morning. Patient's telephone number does not accept incoming calls.  Frederick [name]

## 2013-04-28 ENCOUNTER — Ambulatory Visit: Payer: Medicare Other | Admitting: Cardiology

## 2013-05-10 ENCOUNTER — Telehealth: Payer: Self-pay

## 2013-05-10 NOTE — Telephone Encounter (Signed)
New Patient. NO HM updated Unable to leave message due to incorrect contact information

## 2013-05-11 ENCOUNTER — Ambulatory Visit: Payer: Medicare Other | Admitting: Internal Medicine

## 2013-05-11 DIAGNOSIS — Z0289 Encounter for other administrative examinations: Secondary | ICD-10-CM

## 2013-07-12 ENCOUNTER — Ambulatory Visit: Payer: Medicare Other | Admitting: Internal Medicine

## 2013-07-12 DIAGNOSIS — Z0289 Encounter for other administrative examinations: Secondary | ICD-10-CM

## 2013-07-26 ENCOUNTER — Telehealth: Payer: Self-pay | Admitting: Cardiology

## 2013-07-26 NOTE — Telephone Encounter (Signed)
Please call-wants to know if he is taking Lasix,should he be drinking water?

## 2013-07-26 NOTE — Telephone Encounter (Signed)
Returned call.  Number not in service.  This is the only contact number for pt and EC.  Will await call back.  Called x 2.  Pt has appt tomorrow w/ Corine Shelter, PA-C at 3:20pm.

## 2013-07-27 ENCOUNTER — Ambulatory Visit: Payer: Medicare Other | Admitting: Cardiology

## 2013-07-27 ENCOUNTER — Telehealth: Payer: Self-pay | Admitting: Cardiovascular Disease

## 2013-07-27 NOTE — Telephone Encounter (Signed)
Forward to [facility] PA - - Patient has an appointment on 07/28/13 2:40

## 2013-07-27 NOTE — Telephone Encounter (Signed)
Mrs. Carfagno states that Mr. Is short of breath and one of his legs is larger than the other.  Please call.

## 2013-07-28 ENCOUNTER — Ambulatory Visit: Payer: Medicare Other | Admitting: Cardiology

## 2013-07-28 NOTE — Telephone Encounter (Signed)
He was supposed to come yesterday but didn't. He is supposed to come to the office at 1pm today. If he can't come to the office he can go to the ER.  Corine Shelter PA-C 07/28/2013 8:17 AM

## 2013-08-06 ENCOUNTER — Inpatient Hospital Stay (HOSPITAL_COMMUNITY): Payer: Medicare Other

## 2013-08-06 ENCOUNTER — Emergency Department (HOSPITAL_COMMUNITY): Payer: Medicare Other

## 2013-08-06 ENCOUNTER — Encounter (HOSPITAL_COMMUNITY): Payer: Self-pay | Admitting: Emergency Medicine

## 2013-08-06 ENCOUNTER — Inpatient Hospital Stay (HOSPITAL_COMMUNITY)
Admission: EM | Admit: 2013-08-06 | Discharge: 2013-08-10 | DRG: 291 | Disposition: A | Discharge status: Home Health | Payer: Medicare Other | Attending: Internal Medicine | Admitting: Internal Medicine

## 2013-08-06 DIAGNOSIS — R609 Edema, unspecified: Secondary | ICD-10-CM

## 2013-08-06 DIAGNOSIS — I10 Essential (primary) hypertension: Secondary | ICD-10-CM

## 2013-08-06 DIAGNOSIS — N182 Chronic kidney disease, stage 2 (mild): Secondary | ICD-10-CM | POA: Diagnosis present

## 2013-08-06 DIAGNOSIS — Z7982 Long term (current) use of aspirin: Secondary | ICD-10-CM

## 2013-08-06 DIAGNOSIS — R05 Cough: Secondary | ICD-10-CM | POA: Diagnosis present

## 2013-08-06 DIAGNOSIS — R739 Hyperglycemia, unspecified: Secondary | ICD-10-CM

## 2013-08-06 DIAGNOSIS — I428 Other cardiomyopathies: Secondary | ICD-10-CM | POA: Diagnosis present

## 2013-08-06 DIAGNOSIS — R600 Localized edema: Secondary | ICD-10-CM

## 2013-08-06 DIAGNOSIS — E119 Type 2 diabetes mellitus without complications: Secondary | ICD-10-CM | POA: Diagnosis present

## 2013-08-06 DIAGNOSIS — E785 Hyperlipidemia, unspecified: Secondary | ICD-10-CM | POA: Diagnosis present

## 2013-08-06 DIAGNOSIS — R0602 Shortness of breath: Secondary | ICD-10-CM

## 2013-08-06 DIAGNOSIS — E43 Unspecified severe protein-calorie malnutrition: Secondary | ICD-10-CM

## 2013-08-06 DIAGNOSIS — I129 Hypertensive chronic kidney disease with stage 1 through stage 4 chronic kidney disease, or unspecified chronic kidney disease: Secondary | ICD-10-CM | POA: Diagnosis present

## 2013-08-06 DIAGNOSIS — D649 Anemia, unspecified: Secondary | ICD-10-CM | POA: Diagnosis present

## 2013-08-06 DIAGNOSIS — N179 Acute kidney failure, unspecified: Secondary | ICD-10-CM

## 2013-08-06 DIAGNOSIS — I359 Nonrheumatic aortic valve disorder, unspecified: Secondary | ICD-10-CM

## 2013-08-06 DIAGNOSIS — Z950 Presence of cardiac pacemaker: Secondary | ICD-10-CM

## 2013-08-06 DIAGNOSIS — IMO0002 Reserved for concepts with insufficient information to code with codable children: Secondary | ICD-10-CM

## 2013-08-06 DIAGNOSIS — D696 Thrombocytopenia, unspecified: Secondary | ICD-10-CM | POA: Diagnosis present

## 2013-08-06 DIAGNOSIS — J449 Chronic obstructive pulmonary disease, unspecified: Secondary | ICD-10-CM

## 2013-08-06 DIAGNOSIS — R197 Diarrhea, unspecified: Secondary | ICD-10-CM

## 2013-08-06 DIAGNOSIS — I2789 Other specified pulmonary heart diseases: Secondary | ICD-10-CM | POA: Diagnosis present

## 2013-08-06 DIAGNOSIS — R079 Chest pain, unspecified: Secondary | ICD-10-CM

## 2013-08-06 DIAGNOSIS — D72829 Elevated white blood cell count, unspecified: Secondary | ICD-10-CM | POA: Diagnosis present

## 2013-08-06 DIAGNOSIS — R5383 Other fatigue: Secondary | ICD-10-CM

## 2013-08-06 DIAGNOSIS — J101 Influenza due to other identified influenza virus with other respiratory manifestations: Secondary | ICD-10-CM | POA: Diagnosis present

## 2013-08-06 DIAGNOSIS — Z79899 Other long term (current) drug therapy: Secondary | ICD-10-CM

## 2013-08-06 DIAGNOSIS — I5043 Acute on chronic combined systolic (congestive) and diastolic (congestive) heart failure: Principal | ICD-10-CM | POA: Diagnosis present

## 2013-08-06 DIAGNOSIS — E873 Alkalosis: Secondary | ICD-10-CM | POA: Diagnosis present

## 2013-08-06 DIAGNOSIS — E870 Hyperosmolality and hypernatremia: Secondary | ICD-10-CM | POA: Diagnosis present

## 2013-08-06 DIAGNOSIS — J111 Influenza due to unidentified influenza virus with other respiratory manifestations: Secondary | ICD-10-CM | POA: Diagnosis present

## 2013-08-06 DIAGNOSIS — R059 Cough, unspecified: Secondary | ICD-10-CM | POA: Diagnosis present

## 2013-08-06 DIAGNOSIS — I509 Heart failure, unspecified: Secondary | ICD-10-CM | POA: Diagnosis present

## 2013-08-06 DIAGNOSIS — J4489 Other specified chronic obstructive pulmonary disease: Secondary | ICD-10-CM | POA: Diagnosis present

## 2013-08-06 DIAGNOSIS — Z66 Do not resuscitate: Secondary | ICD-10-CM | POA: Diagnosis present

## 2013-08-06 HISTORY — DX: Essential (primary) hypertension: I10

## 2013-08-06 HISTORY — DX: Unspecified severe protein-calorie malnutrition: E43

## 2013-08-06 LAB — TROPONIN I
Troponin I: <0.3 ng/mL (ref <0.30)
Troponin I: <0.3 ng/mL (ref <0.30)

## 2013-08-06 LAB — CBC WITH DIFFERENTIAL/PLATELET
BASOS ABS: 0 10*3/uL (ref 0.0–0.1)
Basophils Relative: 0 % (ref 0–1)
Eosinophils Absolute: 0 10*3/uL (ref 0.0–0.7)
Eosinophils Relative: 0 % (ref 0–5)
HEMATOCRIT: 41.1 % (ref 39.0–52.0)
Hemoglobin: 13.2 g/dL (ref 13.0–17.0)
LYMPHS ABS: 0.8 10*3/uL (ref 0.7–4.0)
LYMPHS PCT: 7 % — AB (ref 12–46)
MCH: 27.2 pg (ref 26.0–34.0)
MCHC: 32.1 g/dL (ref 30.0–36.0)
MCV: 84.6 fL (ref 78.0–100.0)
Monocytes Absolute: 0.4 10*3/uL (ref 0.1–1.0)
Monocytes Relative: 4 % (ref 3–12)
NEUTROS ABS: 9.8 10*3/uL — AB (ref 1.7–7.7)
Neutrophils Relative %: 89 % — ABNORMAL HIGH (ref 43–77)
Platelets: 81 10*3/uL — ABNORMAL LOW (ref 150–400)
RBC: 4.86 MIL/uL (ref 4.22–5.81)
RDW: 16.7 % — AB (ref 11.5–15.5)
WBC: 11 10*3/uL — AB (ref 4.0–10.5)

## 2013-08-06 LAB — LIPID PANEL
CHOL/HDL RATIO: 2.4 ratio
Cholesterol: 132 mg/dL (ref 0–200)
HDL: 54 mg/dL (ref >39)
LDL CALC: 60 mg/dL (ref 0–99)
Triglycerides: 91 mg/dL (ref <150)
VLDL: 18 mg/dL (ref 0–40)

## 2013-08-06 LAB — POCT I-STAT TROPONIN I: TROPONIN I, POC: 0.16 ng/mL — AB (ref 0.00–0.08)

## 2013-08-06 LAB — MAGNESIUM: Magnesium: 2.6 mg/dL — ABNORMAL HIGH (ref 1.5–2.5)

## 2013-08-06 LAB — URINALYSIS, ROUTINE W REFLEX MICROSCOPIC
Bilirubin Urine: NEGATIVE
Glucose, UA: NEGATIVE mg/dL
Hgb urine dipstick: NEGATIVE
Ketones, ur: NEGATIVE mg/dL
Leukocytes, UA: NEGATIVE
NITRITE: NEGATIVE
Protein, ur: NEGATIVE mg/dL
Specific Gravity, Urine: 1.015 (ref 1.005–1.030)
Urobilinogen, UA: 0.2 mg/dL (ref 0.0–1.0)
pH: 5 (ref 5.0–8.0)

## 2013-08-06 LAB — PRO B NATRIURETIC PEPTIDE

## 2013-08-06 LAB — BASIC METABOLIC PANEL
BUN: 77 mg/dL — ABNORMAL HIGH (ref 6–23)
CO2: 26 meq/L (ref 19–32)
CREATININE: 2.05 mg/dL — AB (ref 0.50–1.35)
Calcium: 8.1 mg/dL — ABNORMAL LOW (ref 8.4–10.5)
Chloride: 106 mEq/L (ref 96–112)
GFR calc non Af Amer: 29 mL/min — ABNORMAL LOW (ref >90)
GFR, EST AFRICAN AMERICAN: 34 mL/min — AB (ref >90)
Glucose, Bld: 292 mg/dL — ABNORMAL HIGH (ref 70–99)
Potassium: 4.9 mEq/L (ref 3.7–5.3)
SODIUM: 144 meq/L (ref 137–147)

## 2013-08-06 LAB — GLUCOSE, CAPILLARY
GLUCOSE-CAPILLARY: 227 mg/dL — AB (ref 70–99)
GLUCOSE-CAPILLARY: 140 mg/dL — AB (ref 70–99)
Glucose-Capillary: 227 mg/dL — ABNORMAL HIGH (ref 70–99)
Glucose-Capillary: 171 mg/dL — ABNORMAL HIGH (ref 70–99)

## 2013-08-06 LAB — CREATININE, URINE, RANDOM: Creatinine, Urine: 78.13 mg/dL

## 2013-08-06 LAB — HEPATIC FUNCTION PANEL
ALT: 28 U/L (ref 0–53)
AST: 15 U/L (ref 0–37)
Albumin: 2.6 g/dL — ABNORMAL LOW (ref 3.5–5.2)
Alkaline Phosphatase: 102 U/L (ref 39–117)
Bilirubin, Direct: 0.2 mg/dL (ref 0.0–0.3)
Indirect Bilirubin: 0.5 mg/dL (ref 0.3–0.9)
Total Bilirubin: 0.7 mg/dL (ref 0.3–1.2)
Total Protein: 5.9 g/dL — ABNORMAL LOW (ref 6.0–8.3)

## 2013-08-06 LAB — SODIUM, URINE, RANDOM: SODIUM UR: 40 meq/L

## 2013-08-06 LAB — LACTATE DEHYDROGENASE: LDH: 346 U/L — ABNORMAL HIGH (ref 94–250)

## 2013-08-06 LAB — HAPTOGLOBIN: HAPTOGLOBIN: 134 mg/dL (ref 45–215)

## 2013-08-06 LAB — PHOSPHORUS: Phosphorus: 5.3 mg/dL — ABNORMAL HIGH (ref 2.3–4.6)

## 2013-08-06 LAB — TSH: TSH: 0.651 u[IU]/mL (ref 0.350–4.500)

## 2013-08-06 MED ORDER — ALBUTEROL SULFATE (2.5 MG/3ML) 0.083% IN NEBU: 2.5000 mg | INHALATION_SOLUTION | RESPIRATORY_TRACT | Status: DC | PRN

## 2013-08-06 MED ORDER — INSULIN ASPART 100 UNIT/ML ~~LOC~~ SOLN
0.0000 [IU] | Freq: Three times a day (TID) | SUBCUTANEOUS | Status: DC
  Administered 2013-08-06: 2 [IU] via SUBCUTANEOUS
  Administered 2013-08-06 – 2013-08-07 (×3): 3 [IU] via SUBCUTANEOUS
  Administered 2013-08-07: 1 [IU] via SUBCUTANEOUS
  Administered 2013-08-08: 3 [IU] via SUBCUTANEOUS
  Administered 2013-08-08: 5 [IU] via SUBCUTANEOUS
  Administered 2013-08-08 – 2013-08-09 (×2): 3 [IU] via SUBCUTANEOUS
  Administered 2013-08-09: 2 [IU] via SUBCUTANEOUS
  Administered 2013-08-09: 7 [IU] via SUBCUTANEOUS
  Administered 2013-08-10: 2 [IU] via SUBCUTANEOUS
  Administered 2013-08-10 (×2): 3 [IU] via SUBCUTANEOUS

## 2013-08-06 MED ORDER — SODIUM CHLORIDE 0.9 % IV SOLN: 250.0000 mL | INTRAVENOUS | Status: DC | PRN

## 2013-08-06 MED ORDER — OXYCODONE HCL 5 MG PO TABS
5.0000 mg | ORAL_TABLET | ORAL | Status: DC | PRN
  Administered 2013-08-06: 5 mg via ORAL
  Filled 2013-08-06: qty 1

## 2013-08-06 MED ORDER — ONDANSETRON HCL 4 MG/2ML IJ SOLN: 4.0000 mg | Freq: Four times a day (QID) | INTRAMUSCULAR | Status: DC | PRN

## 2013-08-06 MED ORDER — ACETAMINOPHEN 650 MG RE SUPP: 650.0000 mg | Freq: Four times a day (QID) | RECTAL | Status: DC | PRN

## 2013-08-06 MED ORDER — ASPIRIN EC 81 MG PO TBEC
81.0000 mg | DELAYED_RELEASE_TABLET | Freq: Every day | ORAL | Status: DC
  Administered 2013-08-06 – 2013-08-10 (×5): 81 mg via ORAL
  Filled 2013-08-06 (×5): qty 1

## 2013-08-06 MED ORDER — ACETAMINOPHEN 325 MG PO TABS: 650.0000 mg | ORAL_TABLET | Freq: Four times a day (QID) | ORAL | Status: DC | PRN

## 2013-08-06 MED ORDER — ASPIRIN 81 MG PO CHEW
324.0000 mg | CHEWABLE_TABLET | Freq: Once | ORAL | Status: AC
  Administered 2013-08-06: 324 mg via ORAL

## 2013-08-06 MED ORDER — POLYETHYLENE GLYCOL 3350 17 G PO PACK
17.0000 g | PACK | Freq: Every day | ORAL | Status: DC | PRN
  Filled 2013-08-06: qty 1

## 2013-08-06 MED ORDER — SODIUM CHLORIDE 0.9 % IJ SOLN
3.0000 mL | Freq: Two times a day (BID) | INTRAMUSCULAR | Status: DC
  Administered 2013-08-06 – 2013-08-10 (×6): 3 mL via INTRAVENOUS

## 2013-08-06 MED ORDER — SODIUM CHLORIDE 0.9 % IJ SOLN: 3.0000 mL | INTRAMUSCULAR | Status: DC | PRN

## 2013-08-06 MED ORDER — ENSURE COMPLETE PO LIQD
237.0000 mL | Freq: Two times a day (BID) | ORAL | Status: DC
  Administered 2013-08-06 – 2013-08-10 (×7): 237 mL via ORAL

## 2013-08-06 MED ORDER — GI COCKTAIL ~~LOC~~
30.0000 mL | Freq: Three times a day (TID) | ORAL | Status: DC | PRN
  Filled 2013-08-06: qty 30

## 2013-08-06 MED ORDER — ONDANSETRON HCL 4 MG PO TABS: 4.0000 mg | ORAL_TABLET | Freq: Four times a day (QID) | ORAL | Status: DC | PRN

## 2013-08-06 MED ORDER — FLEET ENEMA 7-19 GM/118ML RE ENEM
1.0000 | ENEMA | Freq: Once | RECTAL | Status: AC | PRN
  Filled 2013-08-06: qty 1

## 2013-08-06 MED ORDER — SODIUM CHLORIDE 0.9 % IJ SOLN
3.0000 mL | Freq: Two times a day (BID) | INTRAMUSCULAR | Status: DC
Start: 2013-08-06 — End: 2013-08-10
  Administered 2013-08-08: 3 mL via INTRAVENOUS

## 2013-08-06 MED ORDER — PANTOPRAZOLE SODIUM 40 MG PO TBEC
40.0000 mg | DELAYED_RELEASE_TABLET | Freq: Every day | ORAL | Status: DC
  Administered 2013-08-06 – 2013-08-10 (×4): 40 mg via ORAL
  Filled 2013-08-06 (×4): qty 1

## 2013-08-06 MED ORDER — FUROSEMIDE 10 MG/ML IJ SOLN
40.0000 mg | Freq: Two times a day (BID) | INTRAMUSCULAR | Status: DC
  Administered 2013-08-06 (×2): 40 mg via INTRAVENOUS
  Filled 2013-08-06 (×4): qty 4

## 2013-08-06 MED ORDER — CARVEDILOL 3.125 MG PO TABS
3.1250 mg | ORAL_TABLET | Freq: Two times a day (BID) | ORAL | Status: DC
  Administered 2013-08-06 – 2013-08-10 (×8): 3.125 mg via ORAL
  Filled 2013-08-06 (×11): qty 1

## 2013-08-06 MED ORDER — SORBITOL 70 % SOLN
30.0000 mL | Freq: Every day | Status: DC | PRN
  Filled 2013-08-06: qty 30

## 2013-08-06 MED ORDER — IPRATROPIUM-ALBUTEROL 0.5-2.5 (3) MG/3ML IN SOLN: 3.0000 mL | RESPIRATORY_TRACT | Status: DC | PRN

## 2013-08-06 NOTE — Progress Notes (Signed)
  Echocardiogram 2D Echocardiogram has been performed.  Frederick [name] [name] 08/06/2013, 6:45 PM

## 2013-08-06 NOTE — Care Management Note (Signed)
    Page 1 of 2   08/10/2013     2:10:37 PM   CARE MANAGEMENT NOTE 08/10/2013  Patient:  Frederick [name], Frederick [name]   Account Number:  [id number]  Date Initiated:  08/06/2013  Documentation initiated by:  Paris Chiriboga  Subjective/Objective Assessment:   PT ADM ON 08/06/13 WITH CP, SOB, ARF, HTN.  PTA, PT LIVED AT HOME WITH SPOUSE, WHO PER REPORT, IS DISABLED.  HE ALSO HAS A SON, WHO LIVES  OUT OF TOWN.     Action/Plan:   PT/OT CONSULTS PENDING.  PT SEEMS QUITE DECONDITIONED.  MAY NEED SNF PLACEMENT.  WILL FOLLOW PROGRESS.   Anticipated DC Date:  08/10/2013   Anticipated DC Plan:  SKILLED NURSING FACILITY  In-house referral  Clinical Social Worker      DC Associate Professor  CM consult      [facility] Choice  HOME HEALTH   Choice offered to / List presented to:  C-2 HC POA / Guardian   DME arranged  HOSPITAL BED  BEDSIDE COMMODE  WALKER - ROLLING  OXYGEN      DME agency  [facility]     HH arranged  HH-1 RN  HH-10 DISEASE MANAGEMENT  HH-2 PT  HH-3 OT  HH-4 NURSE'S AIDE  HH-6 SOCIAL WORKER      HH agency  [facility]   Status of service:  Completed, signed off Medicare Important Message given?   (If response is "NO", the following Medicare IM given date fields will be blank) Date Medicare IM given:   Date Additional Medicare IM given:    Discharge Disposition:  HOME W HOME HEALTH SERVICES  Per UR Regulation:  Reviewed for med. necessity/level of care/duration of stay  If discussed at Long Length of Stay Meetings, dates discussed:    Comments:  08/10/13 Mercie Balsley,RN,BSN [phone] PT FOR DC TODAY; RECOMMENDATION IS FOR SNF, BUT WIFE REFUSING, AND WISHES TO TAKE PT HOME, AGAINST ADVICE OF MD. SPOKE WITH SON AND WIFE BY PHONE [phone]); SON WILL BE ASSISTING WITH CARE AT DC.  REQUESTING DME FOR HOME; PT WILL ALSO NEED HOME O2, AS RESTING RA SAT 83%.  REFERRAL TO [facility] FOR HH AND DME NEEDS, PER SON/WIFE CHOICE.  START OF CARE 24-48H POST DC DATE.

## 2013-08-06 NOTE — Progress Notes (Signed)
Pt admitted to 2w15; MD paged for admission orders; will await callback.

## 2013-08-06 NOTE — Progress Notes (Signed)
INITIAL NUTRITION ASSESSMENT  DOCUMENTATION CODES Per approved criteria  -Severe malnutrition in the context of chronic illness   INTERVENTION:  Continue Ensure Complete po BID, each supplement provides 350 kcal and 13 grams of protein RD to follow for nutrition care plan  NUTRITION DIAGNOSIS: Increased nutrient needs related to malnutrition, repletion as evidenced by estimated nutrition needs  Goal: Pt to meet >/= 90% of their estimated nutrition needs   Monitor:  PO & supplemental intake, weight, labs, I/O's  Reason for Assessment: Consult  80 y.o. male  Admitting Dx: Shortness of breath  ASSESSMENT: Patient with PMH of DM/HTN/COPD; presented to the ED with a one-week history of worsening shortness of breath, and midsternal/epigastric chest pain; admitted for further evaluation and management.   RD unable to obtain nutrition hx from patient; sleeping upon RD visit; unable to wake; lunch tray on tray table and untouched; patient with visible muscle & fat loss to upper body; weight history unavailable for RD to assess; Ensure Complete supplements ordered per MD.  Patient meets criteria for severe malnutrition in the context of chronic illness as evidenced by severe muscle loss (clavicles, deltoids, pectoralis) and severe subcutaneous fat loss (upper arm region).  Height: Ht Readings from Last 1 Encounters:  08/06/13 5\' 6"  (1.676 m)    Weight: Wt Readings from Last 1 Encounters:  08/06/13 130 lb 1.1 oz (59 kg)    Ideal Body Weight: 166 lb  % Ideal Body Weight: 78%  Wt Readings from Last 10 Encounters:  08/06/13 130 lb 1.1 oz (59 kg)    Usual Body Weight: unable to obtain  % Usual Body Weight: ---  BMI:  Body mass index is 21 kg/(m^2).  Estimated Nutritional Needs: Kcal: 1650-1850 Protein: 80-90 gm Fluid: 1.6-1.8 L  Skin: small abrasions to bilateral legs  Diet Order: Cardiac  EDUCATION NEEDS: -No education needs identified at this  time   Intake/Output Summary (Last 24 hours) at 08/06/13 1249 Last data filed at 08/06/13 1224  Gross per 24 hour  Intake      0 ml  Output      0 ml  Net      0 ml    Labs:   Recent Labs Lab 08/06/13 0510  NA 144  K 4.9  CL 106  CO2 26  BUN 77*  CREATININE 2.05*  CALCIUM 8.1*  GLUCOSE 292*    CBG (last 3)   Recent Labs  08/06/13 0821 08/06/13 1126  GLUCAP 227* 227*    Scheduled Meds: . aspirin EC  81 mg Oral Daily  . carvedilol  3.125 mg Oral BID WC  . feeding supplement (ENSURE COMPLETE)  237 mL Oral BID BM  . furosemide  40 mg Intravenous Q12H  . insulin aspart  0-9 Units Subcutaneous TID WC  . pantoprazole  40 mg Oral Q0600  . sodium chloride  3 mL Intravenous Q12H  . sodium chloride  3 mL Intravenous Q12H    Continuous Infusions:   Past Medical History  Diagnosis Date  . HTN (hypertension) 08/06/2013  . Protein-calorie malnutrition, severe 08/06/2013    History reviewed. No pertinent past surgical history.  Maureen Chatters, RD, LDN Pager #: [phone] After-Hours Pager #: [phone]

## 2013-08-06 NOTE — Progress Notes (Signed)
condum cath placed this AM; order for foley; spoke with MD; ok to leave condum cath in place to measure urine output; will cont. To monitor.

## 2013-08-06 NOTE — Evaluation (Signed)
Clinical/Bedside Swallow Evaluation Patient Details  Name: Frederick [name] MRN: [id number] Date of Birth: November 05, 1931  Today's Date: 08/06/2013 Time: 5945-8592 SLP Time Calculation (min): 17 min  Past Medical History:  Past Medical History  Diagnosis Date  . HTN (hypertension) 08/06/2013  . Protein-calorie malnutrition, severe 08/06/2013   Past Surgical History: History reviewed. No pertinent past surgical history. HPI:  80 y.o. male admitted with chest pain, SOB, acute renal failure.  Questionable acute on chronic CHF exacerbation.  Primary doctors in [place].  Bedside swallow eval ordered.     Assessment / Plan / Recommendation Clinical Impression  Pt presents with a likely acute transient dysphagia, brought on by weakened state and AMS.  Presents with s/s of aspiration with consumption of thin liquids; toleration of nectar-thick liquids appears adequate at bedside.  Pt inconsistently alert during assessment.     Recommend downgrading diet to dysphagia 2, nectar-thick liquids for now; meds whole in puree.  SLP will follow for clinical improvements and advance diet as tolerated.    PLEASE HOLD POS AND CALL W/E SLP IF TOLERATION DETERIORATES.         Moderate    Diet Recommendation Dysphagia 2 (Fine chop);Nectar-thick liquid   Liquid Administration via: Cup Medication Administration: Whole meds with puree Supervision: Full supervision/cueing for compensatory strategies Compensations: Slow rate;Small sips/bites Postural Changes and/or Swallow Maneuvers: Seated upright 90 degrees    Other  Recommendations Oral Care Recommendations: Oral care BID Other Recommendations: Order thickener from pharmacy   Follow Up Recommendations   (tba)    Frequency and Duration min 2x/week  2 weeks       SLP Swallow Goals     Swallow Study Prior Functional Status       General Date of Onset: 08/06/13 HPI: 80 y.o. male admitted with chest pain, SOB, acute renal failure.  Questionable acute on  chronic CHF exacerbation.  Primary doctors in [place].  Bedside swallow eval ordered.   Type of Study: Bedside swallow evaluation Previous Swallow Assessment: none per records Diet Prior to this Study: Regular;Thin liquids Temperature Spikes Noted: No Respiratory Status: Nasal cannula Behavior/Cognition: Lethargic Oral Cavity - Dentition: Dentures, top;Dentures, bottom Self-Feeding Abilities: Needs assist Patient Positioning: Upright in bed Baseline Vocal Quality: Clear Volitional Cough: Strong Volitional Swallow: Able to elicit    Oral/Motor/Sensory Function Overall Oral Motor/Sensory Function: Appears within functional limits for tasks assessed   Ice Chips Ice chips: Within functional limits Presentation: Spoon   Thin Liquid Thin Liquid: Impaired Presentation: Straw;Cup Pharyngeal  Phase Impairments: Multiple swallows;Cough - Delayed    Nectar Thick Nectar Thick Liquid: Within functional limits Presentation: Cup   Honey Thick Honey Thick Liquid: Not tested   Puree Puree: Within functional limits Presentation: Spoon   Solid   GO    Solid: Not tested       Blenda Mounts Laurice 08/06/2013,3:04 PM

## 2013-08-06 NOTE — H&P (Signed)
[facility] History and Physical  Frederick EndsRobert Ritthaler ZOX:[id number] DOB: 01/23/1932 DOA: 08/06/2013  Referring physician: Dr Bebe ShaggyWickline PCP: No PCP Per Patient   Chief Complaint: SOB  HPI: Frederick [name] is a 80 y.o. male  With hx of DM/HTN/COPD/ s/p PPM no no info in epic who states his doctors are strong presented to the ED with a one-week history of worsening shortness of breath, and midsternal/epigastric chest pain. Patient describes chest pain is sharp nonradiating with a burning sensation to the fact that he thought he was having heartburn. Patient denies any burping. No hematemesis. No melena and. No hematochezia. Patient denies any fevers, no chills, no vomiting, no other abdominal pain, no weakness, no constipation, no cough, no sore throat. Patient does endorse some diarrhea, some nausea, no recent antibiotic use. Patient describes his stools as loose although for the past 7 days about 3 a day. Patient currently chest pain-free. Per nursing notes from the ED patient was noted to be short of breath and lethargic on EMS arrival to the scene where patient was noted to be 87% on room air patient was placed on 4 L of oxygen with sats increasing to 100%. Patient is currently chest pain-free. Patient was seen in the ED basic metabolic profile done had a BUN of 77 creatinine of 2.05 glucose of 292 otherwise was within normal limits. First set of troponin was less than 0.30. CBC had a white count of 11 platelet count of 81 otherwise was within normal limits. EKG showed T wave inversions in leads V4 through V6. Chest x-ray was pending. We were consulted to admit the patient for further evaluation and management.   Review of Systems:  Constitutional:  No weight loss, night sweats, Fevers, chills, fatigue.  HEENT:  No headaches, Difficulty swallowing,Tooth/dental problems,Sore throat,  No sneezing, itching, ear ache, nasal congestion, post nasal drip,  Cardio-vascular:  No chest pain, Orthopnea, PND,  swelling in lower extremities, anasarca, dizziness, palpitations  GI:  No heartburn, indigestion, abdominal pain, nausea, vomiting, diarrhea, change in bowel habits, loss of appetite  Resp:  No shortness of breath with exertion or at rest. No excess mucus, no productive cough, No non-productive cough, No coughing up of blood.No change in color of mucus.No wheezing.No chest wall deformity  Skin:  no rash or lesions.  GU:  no dysuria, change in color of urine, no urgency or frequency. No flank pain.  Musculoskeletal:  No joint pain or swelling. No decreased range of motion. No back pain.  Psych:  No change in mood or affect. No depression or anxiety. No memory loss.   Past Medical History  Diagnosis Date  . HTN (hypertension) 08/06/2013  . Protein-calorie malnutrition, severe 08/06/2013   History reviewed. No pertinent past surgical history. Social History:  reports that he does not drink alcohol or use illicit drugs. His tobacco history is not on file.  No Known Allergies  History reviewed. No pertinent family history.   Prior to Admission medications   Not on File   Physical Exam: Filed Vitals:   08/06/13 0817  BP: 110/43  Pulse: 68  Temp: 97.7 F (36.5 C)  Resp: 16    BP 110/43  Pulse 68  Temp(Src) 97.7 F (36.5 C) (Oral)  Resp 16  Ht 5\' 6"  (1.676 m)  Wt 59 kg (130 lb 1.1 oz)  BMI 21.00 kg/m2  SpO2 97%  General:  Appears calm and comfortable. Cachectic. Eyes: PERRL, extraocular movements intact. normal lids, irises & conjunctiva ENT: grossly normal hearing,  lips & tongue Neck: no LAD, masses or thyromegaly Cardiovascular: RRR, no m/r/g. 3+ bilateral lower extremity edema up to the hips. Telemetry: SR, no arrhythmias  Respiratory: CTA bilaterally, no w/r/r. Normal respiratory effort. Abdomen: soft, ntnd, positive bowel sounds. Skin: no rash or induration seen on limited exam Musculoskeletal: grossly normal tone BUE/BLE Psychiatric: grossly normal mood and  affect, speech fluent and appropriate Neurologic: Alert and oriented x3. Cranial nerves II through XII are grossly intact. No focal deficits. Sensation is intact. Visual fields are intact. Gait not tested secondary to safety.           Labs on Admission:  Basic Metabolic Panel:  Recent Labs Lab 08/06/13 0510  NA 144  K 4.9  CL 106  CO2 26  GLUCOSE 292*  BUN 77*  CREATININE 2.05*  CALCIUM 8.1*   Liver Function Tests: No results found for this basename: AST, ALT, ALKPHOS, BILITOT, PROT, ALBUMIN,  in the last 168 hours No results found for this basename: LIPASE, AMYLASE,  in the last 168 hours No results found for this basename: AMMONIA,  in the last 168 hours CBC:  Recent Labs Lab 08/06/13 0510  WBC 11.0*  NEUTROABS 9.8*  HGB 13.2  HCT 41.1  MCV 84.6  PLT 81*   Cardiac Enzymes:  Recent Labs Lab 08/06/13 0533  TROPONINI <0.30    BNP (last 3 results) No results found for this basename: PROBNP,  in the last 8760 hours CBG:  Recent Labs Lab 08/06/13 0821  GLUCAP 227*    Radiological Exams on Admission: No results found.  EKG: Independently reviewed. T wave inversion in leads V4-V6.  Assessment/Plan Principal Problem:   Shortness of breath Active Problems:   Chest pain   HTN (hypertension)   Diabetes   COPD (chronic obstructive pulmonary disease)   Cardiac pacemaker in situ   Bilateral lower extremity edema   ARF (acute renal failure)   Protein-calorie malnutrition, severe   Diarrhea   Thrombocytopenia, unspecified  #1 shortness of breath Questionable etiology. Questionable acute on chronic CHF exacerbation. Patient does have a pacemaker -- probable cardiac history. Patient also with bilateral lower extremity edema up to the hips and may be volume overloaded. Will check a pro BNP. Chest x-ray is pending. Cycle cardiac enzymes every 6 hours x3. Check a 2-D echo. We'll place on Lasix 40 mg IV every 12 hours. Will follow.  #2 hypertension Patient  does endorse hypertension. Med rec is pending. Patient states was on Coreg and lisinopril. We'll place on low-dose Coreg for now.  #3 acute renal failure Baseline creatinine is unknown as all patient's doctors are in [place]. Question secondary to acute on chronic CHF exacerbation as patient does have lower extremity edema. Will try to get records. Creatinine on admission was 2.05. Will check a urinalysis with cultures and sensitivities. Check a urine sodium. Check a urine creatinine. Will follow urine output. Place a Foley catheter. Check a renal ultrasound. Patient noted to have lower extremity edema and a such we'll place on IV Lasix and follow.  #4 diarrhea Check stool for C. difficile. If negative place on Imodium as needed.  #5 thrombocytopenia Patient denies any bleeding. We'll check LDH. Check a haptoglobin. Check a peripheral smear. Monitor. May need outpatient workup.  #6 chest pain/epigastric pain Patient with atypical features of chest pain pointing more in the epigastric region. EKG however does have T wave inversions in leads V4 through V6. Patient is currently chest pain-free. We'll cycle cardiac enzymes every 6 hours  x3. Check a 2-D echo. Place on a beta blocker. Place on a baby aspirin. Place on a PPI. Follow. If enzymes are abnormal a 2-D echo is abnormal we'll consult cardiology.  #7 diabetes mellitus Check a hemoglobin A1c. Place on sliding scale insulin.  #8 leukocytosis Check a UA with cultures and sensitivities. Check stool for C. difficile PCR. Chest x-ray is pending. No need for antibiotics at this time. Follow.  #9 bilateral lower extremity edema Questionable etiology. Patient did present with some shortness of breath and concern is for acute CHF exacerbation. Med rec is pending to see patient is an 80 calcium channel blockers. Will check a hepatic panel to evaluate liver function. Check a urinalysis with cultures and sensitivities to see if patient may have some  proteinuria as patient's creatinine is elevated at 2.05. We'll cycle cardiac enzymes every 6 hours x3. Check a 2-D echo. Check a pro BNP. We'll place on Lasix 40 mg IV every 12. Follow.  #10 malnutrition Nutrition consult. Placed on Ensure 3 times daily.  #11 status post permanent pacemaker Follow. Interrogate pacemaker.  #12 COPD Stable. Nebs as needed.  #13 prophylaxis PPI for GI prophylaxis. SCDs for DVT prophylaxis.  Code Status: DNR Family Communication: Updated patient. No family present. Disposition Plan: Admit to telemetry  Time spent: 70 mins  [facility] Pager [phone]

## 2013-08-06 NOTE — Progress Notes (Deleted)
SATURATION QUALIFICATIONS: (This note is used to comply with regulatory documentation for home oxygen)  Patient Saturations on Room Air at Rest = 91%  Patient Saturations on Room Air while Ambulating = 78%  Patient Saturations on  2 Liters of oxygen while Ambulating = 87%  Please briefly explain why patient needs home oxygen: desaturation    Claudia Alvizo,MSN,RN

## 2013-08-06 NOTE — ED Provider Notes (Signed)
CSN: [id number]     Arrival date & time 08/06/13  0453 History   First MD Initiated Contact with Patient 08/06/13 0457     Chief Complaint  Patient presents with  . Shortness of Breath  . Fatigue   Patient is a 80 y.o. male presenting with shortness of breath. The history is provided by the patient.  Shortness of Breath Severity:  Moderate Onset quality:  Gradual Duration:  7 days Timing:  Intermittent Progression:  Worsening Chronicity:  New Relieved by:  Nothing Worsened by:  Nothing tried Associated symptoms: abdominal pain and chest pain   pt presents from home for multiple complaints He reports SOB for past week with associated CP - he reports he has had CP everyday He also reports diarrhea for past week. He reports fatigue   He reports his physician is in [place] but he has lived in [place] 7 years Pt reports he had pacemaker [facility] yrs [facility] but has not seen anyone recently for this  Per EMS they were called to house by wife to help patient get in bed.  While there they noted pt was lethargic, bradycardic and hypoxic.  He has improved since that time  PMH - COPD, pacemaker Soc hx - live at home with wife  No past surgical history on file. No family history on file. History  Substance Use Topics  . Smoking status: Not on file  . Smokeless tobacco: Not on file  . Alcohol Use: Not on file    Review of Systems  Constitutional: Positive for fatigue.  Respiratory: Positive for shortness of breath.   Cardiovascular: Positive for chest pain.  Gastrointestinal: Positive for abdominal pain and diarrhea.  All other systems reviewed and are negative.    Allergies  Review of patient's allergies indicates not on file.  Home Medications  No current outpatient prescriptions on file. BP 115/58  Pulse 67  Temp(Src) 97.9 F (36.6 C) (Oral)  Resp 18  SpO2 100% Physical Exam CONSTITUTIONAL: frail, elderly HEAD: Normocephalic/atraumatic EYES:  EOMI ENMT: Mucous membranes dry NECK: supple no meningeal signs SPINE:entire spine nontender CV: S1/S2 noted LUNGS: decreased BS noted bilaterally, but no distress, speaks to me comfortably ABDOMEN: soft, nontender, no rebound or guarding GU:no cva tenderness NEURO: Pt is awake/alert, moves all extremitiesx4 EXTREMITIES: pulses normal, full ROM SKIN: dry skin noted PSYCH: no abnormalities of mood noted  ED Course  Procedures (including critical care time) 5:55 AM Pt is a poor historian, but reports fatigue/diarrhea/chest pain/sob for past week He is very frail appearing Currently he is hemodynamically stable Labs/imaging pending at this time  7:12 AM Pt found to have acute renal failure, likely related to his recent diarrhea Pt had reported CP, however he is in no distress currently, initial troponin negative.  His EKG is abnormal, however acuity of EKG is unclear and does not appear clinically to have ACS (he is mostly concerned with diarrhea) I discussed xray with radiology dr Andria Meusestevens, no acute findings D/w [facility], will admit   Labs Review Labs Reviewed  CBC WITH DIFFERENTIAL - Abnormal; Notable for the following:    WBC 11.0 (*)    RDW 16.7 (*)    Neutrophils Relative % 89 (*)    Neutro Abs 9.8 (*)    Lymphocytes Relative 7 (*)    All other components within normal limits  POCT I-STAT TROPONIN I - Abnormal; Notable for the following:    Troponin i, poc 0.16 (*)  All other components within normal limits  BASIC METABOLIC PANEL  TROPONIN I  URINALYSIS, ROUTINE W REFLEX MICROSCOPIC   Imaging Review No results found.  EKG Interpretation    Date/Time:  Friday August 06 2013 04:56:53 EST Ventricular Rate:  60 PR Interval:  151 QRS Duration: 129 QT Interval:  463 QTC Calculation: 463 R Axis:   63 Text Interpretation:  Sinus rhythm Multiple premature complexes, vent  Probable left atrial enlargement Right bundle branch block Abnrm T, consider ischemia,  anterolateral lds significant artifact noted Confirmed by Bebe Shaggy  MD, Caydee Talkington ([id number]) on 08/06/2013 5:10:14 AM            MDM  No diagnosis found. Nursing notes including past medical history and social history reviewed and considered in documentation xrays reviewed and considered Labs/vital reviewed and considered     Joya Gaskins, MD 08/06/13 [phone]

## 2013-08-06 NOTE — Progress Notes (Signed)
CSW received consult for "home." CSW reviewed chart and is awaiting PT/OT consult and evaluation before assessing patient. CSW signing off at this time. Please re consult when social work needs arise.  [name], MSW, [name] [phone]

## 2013-08-06 NOTE — ED Notes (Signed)
Per EMS: pt coming from home with c/o lethargy and shortness of breath. EMS originally called to scene to help pt get back into bed. Upon EMS arrival pt was lethargic, arousable to stimulation, bradycardic via palpation, 87% on RA. Pt placed on 4 lpm oxygen saturation increased to 100%. Pt has a demand pacemaker. Pt is A&Ox4 upon arrival to ED, skin warm and dry, respirations equal and unlabored.

## 2013-08-07 ENCOUNTER — Encounter (HOSPITAL_COMMUNITY): Payer: Self-pay | Admitting: Cardiology

## 2013-08-07 DIAGNOSIS — R059 Cough, unspecified: Secondary | ICD-10-CM

## 2013-08-07 DIAGNOSIS — Z950 Presence of cardiac pacemaker: Secondary | ICD-10-CM

## 2013-08-07 DIAGNOSIS — J449 Chronic obstructive pulmonary disease, unspecified: Secondary | ICD-10-CM

## 2013-08-07 DIAGNOSIS — D696 Thrombocytopenia, unspecified: Secondary | ICD-10-CM

## 2013-08-07 DIAGNOSIS — E43 Unspecified severe protein-calorie malnutrition: Secondary | ICD-10-CM

## 2013-08-07 DIAGNOSIS — J4489 Other specified chronic obstructive pulmonary disease: Secondary | ICD-10-CM

## 2013-08-07 DIAGNOSIS — R05 Cough: Secondary | ICD-10-CM

## 2013-08-07 DIAGNOSIS — I10 Essential (primary) hypertension: Secondary | ICD-10-CM

## 2013-08-07 DIAGNOSIS — I5043 Acute on chronic combined systolic (congestive) and diastolic (congestive) heart failure: Principal | ICD-10-CM

## 2013-08-07 LAB — BASIC METABOLIC PANEL
BUN: 79 mg/dL — ABNORMAL HIGH (ref 6–23)
CALCIUM: 7.9 mg/dL — AB (ref 8.4–10.5)
CO2: 25 mEq/L (ref 19–32)
Chloride: 109 mEq/L (ref 96–112)
Creatinine, Ser: 1.88 mg/dL — ABNORMAL HIGH (ref 0.50–1.35)
GFR calc non Af Amer: 32 mL/min — ABNORMAL LOW (ref >90)
GFR, EST AFRICAN AMERICAN: 38 mL/min — AB (ref >90)
GLUCOSE: 118 mg/dL — AB (ref 70–99)
Potassium: 4.2 mEq/L (ref 3.7–5.3)
Sodium: 146 mEq/L (ref 137–147)

## 2013-08-07 LAB — URINE CULTURE
Colony Count: NO GROWTH
Culture: NO GROWTH

## 2013-08-07 LAB — CBC
HCT: 38.5 % — ABNORMAL LOW (ref 39.0–52.0)
Hemoglobin: 12.5 g/dL — ABNORMAL LOW (ref 13.0–17.0)
MCH: 27.4 pg (ref 26.0–34.0)
MCHC: 32.5 g/dL (ref 30.0–36.0)
MCV: 84.2 fL (ref 78.0–100.0)
PLATELETS: 69 10*3/uL — AB (ref 150–400)
RBC: 4.57 MIL/uL (ref 4.22–5.81)
RDW: 16.7 % — AB (ref 11.5–15.5)
WBC: 13.9 10*3/uL — ABNORMAL HIGH (ref 4.0–10.5)

## 2013-08-07 LAB — GLUCOSE, CAPILLARY
GLUCOSE-CAPILLARY: 141 mg/dL — AB (ref 70–99)
GLUCOSE-CAPILLARY: 248 mg/dL — AB (ref 70–99)
GLUCOSE-CAPILLARY: 232 mg/dL — AB (ref 70–99)
Glucose-Capillary: 203 mg/dL — ABNORMAL HIGH (ref 70–99)

## 2013-08-07 LAB — INFLUENZA PANEL BY PCR (TYPE A & B)
H1N1 flu by pcr: NOT DETECTED
Influenza A By PCR: POSITIVE — AB
Influenza B By PCR: NEGATIVE

## 2013-08-07 MED ORDER — PANTOPRAZOLE SODIUM 40 MG PO TBEC
DELAYED_RELEASE_TABLET | ORAL | Status: AC
  Filled 2013-08-07: qty 1

## 2013-08-07 MED ORDER — RESOURCE THICKENUP CLEAR PO POWD
ORAL | Status: DC | PRN
  Filled 2013-08-07: qty 125

## 2013-08-07 MED ORDER — OSELTAMIVIR PHOSPHATE 75 MG PO CAPS: 75.0000 mg | ORAL_CAPSULE | Freq: Two times a day (BID) | ORAL | Status: DC

## 2013-08-07 MED ORDER — OSELTAMIVIR PHOSPHATE 30 MG PO CAPS
30.0000 mg | ORAL_CAPSULE | Freq: Every day | ORAL | Status: DC
  Administered 2013-08-07 – 2013-08-09 (×3): 30 mg via ORAL
  Filled 2013-08-07 (×3): qty 1

## 2013-08-07 MED ORDER — FUROSEMIDE 10 MG/ML IJ SOLN
40.0000 mg | Freq: Two times a day (BID) | INTRAMUSCULAR | Status: DC
  Administered 2013-08-07 – 2013-08-08 (×3): 40 mg via INTRAVENOUS
  Filled 2013-08-07 (×4): qty 4

## 2013-08-07 NOTE — Consult Note (Addendum)
Pt. Seen and examined. Agree with the NP/PA-C note as written. 80 yo male patient we saw recently (under a different medical record number - pending merger) for CHF. He has significant valvular heart disease with moderate to severe AI, moderate MR and TR. His ventricle is dilated with poor systolic function - EF 15-20%. He never made a follow-up appointment with [place] due to lack of transportation. He called the office a few weeks ago with increasing swelling and an appointment was scheduled, but he never showed. He sent back his LifeVest after 2 weeks due to it "being too heavy to wear". On exam he appears volume overloaded, with 1-2+ bilateral pedal edema, rhoncorous cough with overlying crackles at the bases (non-productive) and elevated JVP.  BNP is trending down as is creatinine.  Respiratory viral panel is pending.  Impression: 1 . Acute on chronic systolic congestive heart failure, EF 15-20% 2.  Dilated CM - non-ischemic, possibly due to long-standing valvular heart disease 3.  Possible Viral URI/bronchitis 4.  AKI - improving  Recommend: 1.  Continue lasix 40 IV BID - appears to be -1.8L negative. 2.  Question his ability to be compliant with AICD follow-up if he were upgraded, especially given his LifeVest experience - however, he does have Biotronik pacer which needs to be followed as well.  Recommend EP consult to address this issue. 3.  Not likely to benefit from valve surgery given his advanced cardiomyopathy at this point. 4.  Will re-adjust, titrate CHF medications once he is more clinically compensated.  Thanks for consulting [place]. We will follow along with you.  Chrystie Nose, MD, [facility] Attending Cardiologist [facility]

## 2013-08-07 NOTE — Progress Notes (Signed)
PHARMACIST - PHYSICIAN COMMUNICATION DR: Paris Lore CONCERNING:  Oseltamivir dosing in patient with CrCl <30 ml/min. DESCRIPTION:  This patient has an order for Oseltamivir (Tamiflu) and has an Estimated Creatinine Clearance: 27 ml/min (by C-G formula based on Cr of 1.88).. The package insert for Oseltamivir recommends 30 mg once daily x 5 days for patients with CrCl 10-30 ml/min.   RECOMMENDATION: Oseltamivir dosage adjusted to Oseltamivir 30 mg PO DAILY x 5 days. If any questions or concerns please call the Pharmacy at [phone] or [phone] for assistance.  Frederick [name], RPh Clinical Pharmacist Pager: [phone] 08/07/2013 , 3:48 PM

## 2013-08-07 NOTE — Progress Notes (Addendum)
Current chart reviewed.  Also, pt has been hospitalized here before, different MRN. I reviewed that chart as well. Apparently, patient was admitted in September 2014 for heart failure. 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 Ejection fraction on cath was 20-25%, echo 30-35%. Also with pulmonary hypertension PA pressure 65. At that time, his creatinine was normal. [facility]. There are some messages on other chart that he was scheduled to followup with them and never did. discharge weight was 124 pounds. Pro BNP on admission previously, was 24,000. At discharge 6000. Platelet count during previous admission was normal.    [facility] PROGRESS NOTE  Frederick EndsRobert [name] ZOX:[id number] DOB: 02/26/1932 DOA: 08/06/2013 PCP: No PCP Per Patient  Assessment/Plan:  Principal Problem:   Acute on chronic combined systolic and diastolic heart failure: Continue IV Lasix. No ACE inhibitor due to renal failure. Blood pressure borderline, but can consider hydralazine, and and isosorbide. Consult cardiology for recommendations. Discharge weight in September was 124 pounds. Today 130 Active Problems:   Chest pain: MI ruled out   HTN (hypertension): Blood pressure borderline low.   Diabetes: Controlled. Continue current   COPD (chronic obstructive pulmonary disease)   Cardiac pacemaker in situ   ARF (acute renal failure): Likely cardiorenal. Creatinine improving with diuresis.   Protein-calorie malnutrition, severe   Diarrhea: 1 stool recorded but I don't see that it was sent for C. difficile. Will discuss with nursing.   Thrombocytopenia, unspecified: New. Worse today. Monitor. No bleeding. Check anemia panel. Check coags. No heparin products.   Cough: Check flu swab  PT evaluation when able.   Code Status:  DO NOT RESUSCITATE  Family Communication:   none available Disposition Plan:  ? May need placement   Consultants:    [facility] called  Procedures:     Antibiotics:    HPI/Subjective:   breathing a little easier. Has had a cough for 7 days. Some fevers at home, subjective. No myalgias. No sore throat.   Objective: Filed Vitals:   08/07/13 0407  BP: 108/43  Pulse: 63  Temp: 97.6 F (36.4 C)  Resp: 16    Intake/Output Summary (Last 24 hours) at 08/07/13 0857 Last data filed at 08/06/13 1723  Gross per 24 hour  Intake    180 ml  Output    600 ml  Net   -420 ml   Filed Weights   08/06/13 0826 08/07/13 0407  Weight: 59 kg (130 lb 1.1 oz) 60 kg (132 lb 4.4 oz)    Exam:   General:   alert. Chronically ill appearing. Frequent dry cough noted.  Cardiovascular:  regular rate rhythm without murmurs gallops rubs   Respiratory:  diminished throughout without wheezes rhonchi or rales   Abdomen:  soft nontender nondistended   Ext: edema in all extremities, though improved from yesterday   Basic Metabolic Panel:  Recent Labs Lab 08/06/13 0510 08/06/13 1121 08/07/13 0500  NA 144  --  146  K 4.9  --  4.2  CL 106  --  109  CO2 26  --  25  GLUCOSE 292*  --  118*  BUN 77*  --  79*  CREATININE 2.05*  --  1.88*  CALCIUM 8.1*  --  7.9*  MG  --  2.6*  --   PHOS  --  5.3*  --    Liver Function Tests:  Recent Labs Lab 08/06/13 1121  AST 15  ALT 28  ALKPHOS 102  BILITOT 0.7  PROT 5.9*  ALBUMIN 2.6*   No results found for this basename: LIPASE, AMYLASE,  in the last 168 hours No results found for this basename: AMMONIA,  in the last 168 hours CBC:  Recent Labs Lab 08/06/13 0510 08/07/13 0500  WBC 11.0* 13.9*  NEUTROABS 9.8*  --   HGB 13.2 12.5*  HCT 41.1 38.5*  MCV 84.6 84.2  PLT 81* 69*   Cardiac Enzymes:  Recent Labs Lab 08/06/13 0533 08/06/13 1121 08/06/13 1445 08/06/13 2220  TROPONINI <0.30 <0.30 <0.30 <0.30   BNP (last 3 results)  Recent Labs  08/06/13 1121  PROBNP >70000.0*   CBG:  Recent Labs Lab 08/06/13 0821 08/06/13 1126 08/06/13 1609  08/06/13 2058 08/07/13 0634  GLUCAP 227* 227* 171* 140* 141*    No results found for this or any previous visit (from the past 240 hour(s)).   Studies: Abd 1 View (kub)  08/06/2013   CLINICAL DATA:  Epigastric pain  EXAM: ABDOMEN - 1 VIEW  COMPARISON:  None.  FINDINGS: Air-filled loops of large small bowel are appreciated. There is mild levoscoliosis of the lumbar spine. Patient is status post bilateral hip replacement.  IMPRESSION: Nonspecific nonobstructive bowel gas pattern. An evolving ileus or small-bowel obstruction cannot be excluded and surveillance evaluation is recommended.   Electronically Signed   By: Salome Holmes M.D.   On: 08/06/2013 10:46   US Renal  08/06/2013   CLINICAL DATA:  Acute renal failure  EXAM: RENAL/URINARY TRACT ULTRASOUND COMPLETE  COMPARISON:  None.  FINDINGS: Right Kidney:  Length: 9.6 cm. There is slight increased echogenicity of the renal cortex and decreased corticomedullary differentiation. There is no evidence hydronephrosis. No sonographic evidence of solid or cystic masses nor calculi.  Left Kidney:  Length: 9.5 cm. Increased cortical echogenicity and decreased corticomedullary differentiation. A small 0.9 x 0.9 x 0.9 cm cyst is identified within the corticomedullary junction region of the lower pole. No further sonographic evidence of solid or cystic masses nor calculi. There is no evidence of hydronephrosis.  Bladder:  Appears normal for degree of bladder distention. A Foley catheter is appreciated.  Bilateral pleural effusions are appreciated. A trace amount of bilateral perinephric fluid is appreciated.  IMPRESSION: 1. Increased echogenicity within the renal cortices without evidence of hydronephrosis, solid masses, nor calculi. Small benign appearing cyst left kidney. Trace amount of perinephric fluid bilateral questionable clinical significance. This may reflect sequela of small amount of abdominal ascites. 2. Bilateral effusions   Electronically Signed   By:  Salome Holmes M.D.   On: 08/06/2013 14:10    Scheduled Meds: . aspirin EC  81 mg Oral Daily  . carvedilol  3.125 mg Oral BID WC  . feeding supplement (ENSURE COMPLETE)  237 mL Oral BID BM  . furosemide  40 mg Intravenous Q12H  . insulin aspart  0-9 Units Subcutaneous TID WC  . pantoprazole  40 mg Oral Q0600  . sodium chloride  3 mL Intravenous Q12H  . sodium chloride  3 mL Intravenous Q12H   Continuous Infusions:   Time spent: 55 minutes  Giles Currie L  [facility] Pager [phone]. If 7PM-7AM, please contact night-coverage at [email or web address], password [facility] 08/07/2013, 8:57 AM  LOS: 1 day

## 2013-08-07 NOTE — Consult Note (Signed)
PLEASE NOTE THIS IS PT'S second chart-  Also in EPIC with MRN of [id number]     Reason for Consult:CHF with NICM   Referring Physician: Dr. Conley Canal  No PCP Per Patient Primary Cardiologist:Dr. Johnaton Sonneborn is an 80 y.o. male.    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 The PPM is a Biotronik device that was inserted at [facility], however he has not maintained follow-up since it was placed. He moved from [place] to [place] several years ago and failed to establish care with a PCP and cardiologist. We were consulted 04/2014 to see the patient, for the first time, in the [facility] ER when he presented with complaints of worsening bilateral lower extremity edema and shortness of breath. Work-up in the ER revealed an acute exacerbation of CHF. His BNP was elevated at 24,510. His CXR demonstrated a small right-sided pleural effusion. He had significant peripheral edema on exam, mainly in the lower extremities (3+ pitting edema bilaterally). His EKG was normal and serial cardiac enzymes were negative. He had normal renal function with a SCr of 1.0. He was admitted to telemetry and placed on 40 mg IV Lasix BID for diuresis. He was also placed on an ACE-I. It was decided to wait and initiate a beta blocker after the patient was euvolemic. He diuresed well and his symptoms improved, as did his edema. A 2D echo was obtained, which demonstrated severely reduced systolic function. His EF was estimated at 30-35%. He had diffuse hypokinesis and severe hypokinesis of the inferolateral myocardium. He had Grade I diastolic dysfunction. Right ventricular systolic pressure was also increased, consistent with severe pulmonary hypertension. He was continued on IV Lasix for diuresis. Once near euvolemic, a R/LHC was strongly recommended to assess coronaries for ischemic cardiomyopathy and to measure  pulmonary pressures. The patient initially requested to be transferred to the [facility] hospital is [facility] for further care, however he reconsidered and stayed at [facility]. On 04/19/13 he underwent a R/LHC by Dr. Claiborne Billings via the right femoral artery. He was found to have normal coronaries and severely reduced systolic function, suggesting he has nonischemic cardiomyopathy. His pulmonary pressures were normal and findings suggested that he was euvolemic. He left the cath lab in stable condition. He had no post-cath complications. The right femoral access site remained stable, free from hematoma and bruit. A low dose beta blocker was added to his medical regimen. He tolerated the addition well. Prior to discharge, he was fitted with a LifeVest for prevention of SCD. He was instructed to wear daily for a minimum of 3 months, while on medical therapy. He will have a 2D echo at the end of that period to reassess systolic function and to determine if an ICD is warranted. He will require upward titration of his HF meds as an outpatient. His weight on the day of discharge was 124 lbs. He was discharge home on 81 mg of ASA, 3.125 mg of Coreg BID, 20 mg of Lisinopril and 20 mg of PO Lasix.  Once discharged we could not contact the pt.  So he has not been seen since discharge in September. Rt Heart cath: HEMODYNAMICS:  RA: 8/7  RV: 35/6  PA:36/16  Pc: 15  AO: 122/60  LV: 122/11/17  Cardiac Output: 3.8 l/m (thermodilution); 4.7 l/m (Fick)  Cardiac Index: 2.4l/m/m2 2.9l/m/m2   Left ventriculography revealed a  dilated left ventricle with left ventricular hypertrophy. There is severe LV dysfunction with an ejection fraction of 20-25%. There did appear to be more pronounced basal inferior hypocontractility relative to the other walls.     Pt presented to the ED with a one-week history of worsening shortness of breath, and midsternal/epigastric chest pain. Patient describes chest pain is sharp nonradiating with a burning  sensation to the fact that he thought he was having heartburn. Patient denies any burping. No hematemesis. No melena and. No hematochezia. Patient denies any fevers, no chills, no vomiting, no other abdominal pain, no weakness, no constipation, no cough, no sore throat. Patient did endorse some diarrhea, some nausea, no recent antibiotic use. Patient describes his stools as loose although for the past 7 days about 3 a day. Patient currently chest pain-free.   Per nursing notes from the ED patient was noted to be short of breath and lethargic on EMS arrival to the scene where patient was noted to be 87% on room air patient was placed on 4 L of oxygen with sats increasing to 100%.  Cardiac enzymes negative,  Pro BNP elevated >70,000, Cr was elevated at 2.05 on admit but now 1.88.  2d Echo was done yesterday :  Left ventricle: The cavity size was mildly to moderately dilated. Systolic function was severely reduced. The estimated ejection fraction was in the range of 15-20%. Wall motion was normal; there were no regional wall motion abnormalities. Doppler parameters are consistent with abnormal left ventricular relaxation (grade 1 diastolic dysfunction). Doppler parameters are consistent with elevated ventricular end-diastolic filling pressure. - Aortic valve: Trileaflet; normal thickness leaflets.  Moderate to severe regurgitation.  - Mitral valve: Mild regurgitation. - Left atrium: The atrium was moderately dilated.  - Right ventricle: The cavity size was moderately dilated.  Wall thickness was normal. Systolic function was normal. - Pulmonic valve: Moderate regurgitation.  - Pulmonary arteries: PA peak pressure: 5mm Hg (S).  - Pericardium, extracardiac: A trivial pericardial effusion was identified.  He tells me he wore the lifevest for 2 weeks and mailed it back because it was too heavy.   Past Medical History  Diagnosis Date  . HTN (hypertension) 08/06/2013  . Protein-calorie malnutrition, severe  08/06/2013    History reviewed. No pertinent past surgical history.  History reviewed. No pertinent family history. Social History:  reports that he does not drink alcohol or use illicit drugs. His tobacco history is not on file.  Allergies: No Known Allergies  Medications Prior to Admission  Medication Sig Dispense Refill  . aspirin EC 325 MG tablet Take 325 mg by mouth daily.      . carvedilol (COREG) 3.125 MG tablet Take 3.125 mg by mouth 2 (two) times daily with a meal.      . furosemide (LASIX) 20 MG tablet Take 20 mg by mouth daily.      Marland Kitchen lisinopril (PRINIVIL,ZESTRIL) 10 MG tablet Take 10 mg by mouth daily.        Results for orders placed during the hospital encounter of 08/06/13 (from the past 48 hour(s))  BASIC METABOLIC PANEL     Status: Abnormal   Collection Time    08/06/13  5:10 AM      Result Value Range   Sodium 144  137 - 147 mEq/L   Comment: Please note change in reference range.   Potassium 4.9  3.7 - 5.3 mEq/L   Comment: Please note change in reference range.   Chloride 106  96 -  112 mEq/L   CO2 26  19 - 32 mEq/L   Glucose, Bld 292 (*) 70 - 99 mg/dL   BUN 77 (*) 6 - 23 mg/dL   Creatinine, Ser 2.05 (*) 0.50 - 1.35 mg/dL   Calcium 8.1 (*) 8.4 - 10.5 mg/dL   GFR calc non Af Amer 29 (*) >90 mL/min   GFR calc Af Amer 34 (*) >90 mL/min   Comment: (NOTE)     The eGFR has been calculated using the CKD EPI equation.     This calculation has not been validated in all clinical situations.     eGFR's persistently <90 mL/min signify possible Chronic Kidney     Disease.  CBC WITH DIFFERENTIAL     Status: Abnormal   Collection Time    08/06/13  5:10 AM      Result Value Range   WBC 11.0 (*) 4.0 - 10.5 K/uL   RBC 4.86  4.22 - 5.81 MIL/uL   Hemoglobin 13.2  13.0 - 17.0 g/dL   HCT 41.1  39.0 - 52.0 %   MCV 84.6  78.0 - 100.0 fL   MCH 27.2  26.0 - 34.0 pg   MCHC 32.1  30.0 - 36.0 g/dL   RDW 16.7 (*) 11.5 - 15.5 %   Platelets 81 (*) 150 - 400 K/uL   Comment:  PLATELET COUNT CONFIRMED BY SMEAR   Neutrophils Relative % 89 (*) 43 - 77 %   Neutro Abs 9.8 (*) 1.7 - 7.7 K/uL   Lymphocytes Relative 7 (*) 12 - 46 %   Lymphs Abs 0.8  0.7 - 4.0 K/uL   Monocytes Relative 4  3 - 12 %   Monocytes Absolute 0.4  0.1 - 1.0 K/uL   Eosinophils Relative 0  0 - 5 %   Eosinophils Absolute 0.0  0.0 - 0.7 K/uL   Basophils Relative 0  0 - 1 %   Basophils Absolute 0.0  0.0 - 0.1 K/uL  POCT I-STAT TROPONIN I     Status: Abnormal   Collection Time    08/06/13  5:17 AM      Result Value Range   Troponin i, poc 0.16 (*) 0.00 - 0.08 ng/mL   Comment NOTIFIED PHYSICIAN     Comment 3            Comment: Due to the release kinetics of cTnI,     a negative result within the first hours     of the onset of symptoms does not rule out     myocardial infarction with certainty.     If myocardial infarction is still suspected,     repeat the test at appropriate intervals.  TROPONIN I     Status: None   Collection Time    08/06/13  5:33 AM      Result Value Range   Troponin I <0.30  <0.30 ng/mL   Comment:            Due to the release kinetics of cTnI,     a negative result within the first hours     of the onset of symptoms does not rule out     myocardial infarction with certainty.     If myocardial infarction is still suspected,     repeat the test at appropriate intervals.  GLUCOSE, CAPILLARY     Status: Abnormal   Collection Time    08/06/13  8:21 AM      Result Value Range  Glucose-Capillary 227 (*) 70 - 99 mg/dL   Comment 1 Notify RN    HEPATIC FUNCTION PANEL     Status: Abnormal   Collection Time    08/06/13 11:21 AM      Result Value Range   Total Protein 5.9 (*) 6.0 - 8.3 g/dL   Albumin 2.6 (*) 3.5 - 5.2 g/dL   AST 15  0 - 37 U/L   ALT 28  0 - 53 U/L   Alkaline Phosphatase 102  39 - 117 U/L   Total Bilirubin 0.7  0.3 - 1.2 mg/dL   Bilirubin, Direct 0.2  0.0 - 0.3 mg/dL   Indirect Bilirubin 0.5  0.3 - 0.9 mg/dL  MAGNESIUM     Status: Abnormal    Collection Time    08/06/13 11:21 AM      Result Value Range   Magnesium 2.6 (*) 1.5 - 2.5 mg/dL  PHOSPHORUS     Status: Abnormal   Collection Time    08/06/13 11:21 AM      Result Value Range   Phosphorus 5.3 (*) 2.3 - 4.6 mg/dL  TSH     Status: None   Collection Time    08/06/13 11:21 AM      Result Value Range   TSH 0.651  0.350 - 4.500 uIU/mL   Comment: Performed at [facility]  TROPONIN I     Status: None   Collection Time    08/06/13 11:21 AM      Result Value Range   Troponin I <0.30  <0.30 ng/mL   Comment:            Due to the release kinetics of cTnI,     a negative result within the first hours     of the onset of symptoms does not rule out     myocardial infarction with certainty.     If myocardial infarction is still suspected,     repeat the test at appropriate intervals.  PRO B NATRIURETIC PEPTIDE     Status: Abnormal   Collection Time    08/06/13 11:21 AM      Result Value Range   Pro B Natriuretic peptide (BNP) >70000.0 (*) 0 - 450 pg/mL  LACTATE DEHYDROGENASE     Status: Abnormal   Collection Time    08/06/13 11:21 AM      Result Value Range   LDH 346 (*) 94 - 250 U/L  HAPTOGLOBIN     Status: None   Collection Time    08/06/13 11:21 AM      Result Value Range   Haptoglobin 134  45 - 215 mg/dL   Comment: Performed at [place]     Status: None   Collection Time    08/06/13 11:21 AM      Result Value Range   Cholesterol 132  0 - 200 mg/dL   Triglycerides 91  <150 mg/dL   HDL 54  >39 mg/dL   Total CHOL/HDL Ratio 2.4     VLDL 18  0 - 40 mg/dL   LDL Cholesterol 60  0 - 99 mg/dL   Comment:            Total Cholesterol/HDL:CHD Risk     Coronary Heart Disease Risk Table                         Men   Women      1/2  Average Risk   3.4   3.3      Average Risk       5.0   4.4      2 X Average Risk   9.6   7.1      3 X Average Risk  23.4   11.0                Use the calculated Patient Ratio     above and the CHD  Risk Table     to determine the patient's CHD Risk.                ATP III CLASSIFICATION (LDL):      <100     mg/dL   Optimal      100-129  mg/dL   Near or Above                        Optimal      130-159  mg/dL   Borderline      160-189  mg/dL   High      >190     mg/dL   Very High  GLUCOSE, CAPILLARY     Status: Abnormal   Collection Time    08/06/13 11:26 AM      Result Value Range   Glucose-Capillary 227 (*) 70 - 99 mg/dL  URINALYSIS, ROUTINE W REFLEX MICROSCOPIC     Status: None   Collection Time    08/06/13  1:32 PM      Result Value Range   Color, Urine YELLOW  YELLOW   APPearance CLEAR  CLEAR   Specific Gravity, Urine 1.015  1.005 - 1.030   pH 5.0  5.0 - 8.0   Glucose, UA NEGATIVE  NEGATIVE mg/dL   Hgb urine dipstick NEGATIVE  NEGATIVE   Bilirubin Urine NEGATIVE  NEGATIVE   Ketones, ur NEGATIVE  NEGATIVE mg/dL   Protein, ur NEGATIVE  NEGATIVE mg/dL   Urobilinogen, UA 0.2  0.0 - 1.0 mg/dL   Nitrite NEGATIVE  NEGATIVE   Leukocytes, UA NEGATIVE  NEGATIVE   Comment: MICROSCOPIC NOT DONE ON URINES WITH NEGATIVE PROTEIN, BLOOD, LEUKOCYTES, NITRITE, OR GLUCOSE <1000 mg/dL.  SODIUM, URINE, RANDOM     Status: None   Collection Time    08/06/13  1:33 PM      Result Value Range   Sodium, Ur 40    CREATININE, URINE, RANDOM     Status: None   Collection Time    08/06/13  1:33 PM      Result Value Range   Creatinine, Urine 78.13    TROPONIN I     Status: None   Collection Time    08/06/13  2:45 PM      Result Value Range   Troponin I <0.30  <0.30 ng/mL   Comment:            Due to the release kinetics of cTnI,     a negative result within the first hours     of the onset of symptoms does not rule out     myocardial infarction with certainty.     If myocardial infarction is still suspected,     repeat the test at appropriate intervals.  GLUCOSE, CAPILLARY     Status: Abnormal   Collection Time    08/06/13  4:09 PM      Result Value Range   Glucose-Capillary 171  (*) 70 - 99 mg/dL  GLUCOSE, CAPILLARY     Status: Abnormal   Collection Time    08/06/13  8:58 PM      Result Value Range   Glucose-Capillary 140 (*) 70 - 99 mg/dL  TROPONIN I     Status: None   Collection Time    08/06/13 10:20 PM      Result Value Range   Troponin I <0.30  <0.30 ng/mL   Comment:            Due to the release kinetics of cTnI,     a negative result within the first hours     of the onset of symptoms does not rule out     myocardial infarction with certainty.     If myocardial infarction is still suspected,     repeat the test at appropriate intervals.  BASIC METABOLIC PANEL     Status: Abnormal   Collection Time    08/07/13  5:00 AM      Result Value Range   Sodium 146  137 - 147 mEq/L   Comment: Please note change in reference range.   Potassium 4.2  3.7 - 5.3 mEq/L   Comment: Please note change in reference range.   Chloride 109  96 - 112 mEq/L   CO2 25  19 - 32 mEq/L   Glucose, Bld 118 (*) 70 - 99 mg/dL   BUN 79 (*) 6 - 23 mg/dL   Creatinine, Ser 1.88 (*) 0.50 - 1.35 mg/dL   Calcium 7.9 (*) 8.4 - 10.5 mg/dL   GFR calc non Af Amer 32 (*) >90 mL/min   GFR calc Af Amer 38 (*) >90 mL/min   Comment: (NOTE)     The eGFR has been calculated using the CKD EPI equation.     This calculation has not been validated in all clinical situations.     eGFR's persistently <90 mL/min signify possible Chronic Kidney     Disease.  CBC     Status: Abnormal   Collection Time    08/07/13  5:00 AM      Result Value Range   WBC 13.9 (*) 4.0 - 10.5 K/uL   RBC 4.57  4.22 - 5.81 MIL/uL   Hemoglobin 12.5 (*) 13.0 - 17.0 g/dL   HCT 38.5 (*) 39.0 - 52.0 %   MCV 84.2  78.0 - 100.0 fL   MCH 27.4  26.0 - 34.0 pg   MCHC 32.5  30.0 - 36.0 g/dL   RDW 16.7 (*) 11.5 - 15.5 %   Platelets 69 (*) 150 - 400 K/uL   Comment: PLATELET COUNT CONFIRMED BY SMEAR     LARGE PLATELETS PRESENT  GLUCOSE, CAPILLARY     Status: Abnormal   Collection Time    08/07/13  6:34 AM      Result Value  Range   Glucose-Capillary 141 (*) 70 - 99 mg/dL   Abd 1 View (kub)  08/06/2013   CLINICAL DATA:  Epigastric pain  EXAM: ABDOMEN - 1 VIEW  COMPARISON:  None.  FINDINGS: Air-filled loops of large small bowel are appreciated. There is mild levoscoliosis of the lumbar spine. Patient is status post bilateral hip replacement.  IMPRESSION: Nonspecific nonobstructive bowel gas pattern. An evolving ileus or small-bowel obstruction cannot be excluded and surveillance evaluation is recommended.   Electronically Signed   By: Margaree Mackintosh M.D.   On: 08/06/2013 10:46   US Renal  08/06/2013   CLINICAL DATA:  Acute renal failure  EXAM: RENAL/URINARY  TRACT ULTRASOUND COMPLETE  COMPARISON:  None.  FINDINGS: Right Kidney:  Length: 9.6 cm. There is slight increased echogenicity of the renal cortex and decreased corticomedullary differentiation. There is no evidence hydronephrosis. No sonographic evidence of solid or cystic masses nor calculi.  Left Kidney:  Length: 9.5 cm. Increased cortical echogenicity and decreased corticomedullary differentiation. A small 0.9 x 0.9 x 0.9 cm cyst is identified within the corticomedullary junction region of the lower pole. No further sonographic evidence of solid or cystic masses nor calculi. There is no evidence of hydronephrosis.  Bladder:  Appears normal for degree of bladder distention. A Foley catheter is appreciated.  Bilateral pleural effusions are appreciated. A trace amount of bilateral perinephric fluid is appreciated.  IMPRESSION: 1. Increased echogenicity within the renal cortices without evidence of hydronephrosis, solid masses, nor calculi. Small benign appearing cyst left kidney. Trace amount of perinephric fluid bilateral questionable clinical significance. This may reflect sequela of small amount of abdominal ascites. 2. Bilateral effusions   Electronically Signed   By: Margaree Mackintosh M.D.   On: 08/06/2013 14:10    ROS: General:+ cough and cold recently, increased wt with  swelling Skin:no rashes or ulcers HEENT:no blurred vision, no congestion CV:see HPI PUL:see HPI GI:+ diarrhea prior to admit, no constipation or melena, no indigestion GU:no hematuria, no dysuria MS:no joint pain, no claudication Neuro:no syncope, no lightheadedness Endo:no diabetes, no thyroid disease   Blood pressure 108/43, pulse 63, temperature 97.6 F (36.4 C), temperature source Oral, resp. rate 16, height $RemoveBe'5\' 6"'rKQAfyVCE$  (1.676 m), weight 132 lb 4.4 oz (60 kg), SpO2 96.00%. PE: General:Pleasant affect, NAD, with cough Skin:Warm and dry, brisk capillary refill HEENT:normocephalic, sclera clear, mucus membranes moist Neck:supple, + JVD, no bruits  Heart:S1S2 RRR without murmur, gallup, rub or click Lungs:clear without rales, rhonchi, or wheezes TGY:BWLS, non tender, + BS diminished, do not palpate liver spleen or masses Ext: 1+ lower ext edema, 2+ pedal pulses, 2+ radial pulses Neuro:alert and oriented, MAE, follows commands, + facial symmetry   EKG SR with PACs, RBBB, though deeper t wave inversions in his lat leads   Assessment/Plan Principal Problem:   Acute on chronic combined systolic and diastolic heart failure Active Problems:   Chest pain   HTN (hypertension)   Diabetes   COPD (chronic obstructive pulmonary disease)   Cardiac pacemaker in situ   ARF (acute renal failure)   Protein-calorie malnutrition, severe   Diarrhea   Thrombocytopenia, unspecified   Cough  PLAN:  On Lasix 40 mg IV BID. -1820 since admit wt is up 2 lbs.   Also has flu panel in process.   EF has decreased since last admit.  ACE stopped due to acute renal failure on admit.  Continues on low dose BB but BP borderline.  ? Proceed with PPM upgrade- once possible infectious illness clarified.  Dr. Debara Pickett to see for further recommendations. On lunch tray it was heavy with sodium and added salt.  Will change to 2 gm na diet.    Price Practitioner Certified [place] Pager [phone] or after 5pm or weekends call [phone] 08/07/2013, 11:19 AM

## 2013-08-07 NOTE — Progress Notes (Signed)
SLP Cancellation Note  Patient Details Name: Tramarion Mcpheeters MRN: [id number] DOB: 1931-08-16   Cancelled treatment:  RN paged SLP to reassess swallow due to noted increased coughing with thickened liquids.  ST to f/u on 08/08/13. Moreen Fowler MS, CCC-SLP [phone] [facility] 08/07/2013, 3:52 PM

## 2013-08-07 NOTE — Evaluation (Addendum)
Physical Therapy Evaluation Patient Details Name: Frederick EndsRobert Haskins MRN: [id number] DOB: 11/25/1931 Today's Date: 08/07/2013 Time: [id number] PT Time Calculation (min): 18 min  PT Assessment / Plan / Recommendation History of Present Illness  Pt is a 80 y.o. male adm from home secondary to SOB and acute renal failure. Pt with acute on chronic systolic CHF (EF15-20%). Pt with hx of DM, HTN, COPD s/p PPM. Pt has experienced one week of of worsening SOB and mid sternal/epigastric chest pain.   Clinical Impression  Pt adm due to the above. Presents with limitations in independence with functional mobility secondary to deficits indicated below. Pt to benefit from skilled Pt to address deficits listed below and increase independence with mobility and transfers. Pt was independent prior to hospitalization and has decreased caregiver support. Wife is unable to provide physical (A). Will benefit from SNF for post acute rehab prior to returning home.     PT Assessment  Patient needs continued PT services    Follow Up Recommendations  SNF;Supervision/Assistance - 24 hour    Does the patient have the potential to tolerate intense rehabilitation      Barriers to Discharge Decreased caregiver support wife is unable to provide physical (A) for pt at home     Equipment Recommendations  Other (comment) (TBD)    Recommendations for Other Services     Frequency Min 2X/week    Precautions / Restrictions Precautions Precautions: Fall Precaution Comments: pt reports he has fallen 4 times since Christmas. droplet precautions   Restrictions Weight Bearing Restrictions: No   Pertinent Vitals/Pain No complaints of pain       Mobility  Bed Mobility Bed Mobility: Supine to Sit;Sitting - Scoot to Edge of Bed Supine to Sit: 4: Min assist;HOB elevated;With rails Sitting - Scoot to Edge of Bed: 4: Min assist;With rail Details for Bed Mobility Assistance: (A) to bring to sitting  position at EOB due to  generalized weakness  Transfers Transfers: Sit to Stand;Stand to Sit;Stand Pivot Transfers Sit to Stand: 3: Mod assist;From bed;With upper extremity assist Stand to Sit: 3: Mod assist;To chair/3-in-1;With armrests;With upper extremity assist Stand Pivot Transfers: 3: Mod assist Details for Transfer Assistance: (A) maintain balance and facilitate weight shift and pivot into chair; max cues for hand placement and sequencing  Ambulation/Gait Ambulation/Gait Assistance: Not tested (comment) Stairs: No Wheelchair Mobility Wheelchair Mobility: No         PT Diagnosis: Difficulty walking;Generalized weakness  PT Problem List: Decreased strength;Decreased activity tolerance;Decreased balance;Decreased mobility;Decreased knowledge of use of DME;Decreased safety awareness;Decreased cognition PT Treatment Interventions: DME instruction;Gait training;Functional mobility training;Therapeutic activities;Therapeutic exercise;Balance training;Neuromuscular re-education;Patient/family education     PT Goals(Current goals can be found in the care plan section) Acute Rehab PT Goals Patient Stated Goal: to stop coughing  PT Goal Formulation: With patient Time For Goal Achievement: 08/21/13 Potential to Achieve Goals: Good  Visit Information  Last PT Received On: 08/07/13 Assistance Needed: +1 History of Present Illness: Pt is a 80 y.o. male adm from home        Prior Functioning  Home Living Family/patient expects to be discharged to:: Private residence Living Arrangements: Spouse/significant other Available Help at Discharge: Family;Available PRN/intermittently (wife is in poor health ) Type of Home: Apartment Home Access: Level entry Home Layout: One level Home Equipment: Bedside commode;Shower seat;Cane - single point Additional Comments: pt ambulates with SPC; pt is caregiver for wife  Prior Function Level of Independence: Independent with assistive device(s) Comments: pt drives; is  caregiver for wife  Communication Communication: No difficulties Dominant Hand: Right    Cognition  Cognition Arousal/Alertness: Awake/alert Behavior During Therapy: WFL for tasks assessed/performed Overall Cognitive Status: Impaired/Different from baseline Area of Impairment: Orientation;Safety/judgement Orientation Level: Disoriented to;Situation;Time Memory: Decreased short-term memory Safety/Judgement: Decreased awareness of deficits General Comments: pt beleived today was new years eve; unable to state why he was in the hospital     Extremity/Trunk Assessment Upper Extremity Assessment Upper Extremity Assessment: Defer to OT evaluation Lower Extremity Assessment Lower Extremity Assessment: Generalized weakness Cervical / Trunk Assessment Cervical / Trunk Assessment: Normal   Balance Balance Balance Assessed: Yes Static Sitting Balance Static Sitting - Balance Support: Feet supported;Bilateral upper extremity supported Static Sitting - Level of Assistance: 5: Stand by assistance  End of Session PT - End of Session Equipment Utilized During Treatment: Gait belt;Oxygen Activity Tolerance: Patient limited by fatigue Patient left: in chair;with call bell/phone within reach Nurse Communication: Mobility status;Precautions  GP     Donell Sievert, [place] [phone] 08/07/2013, 12:38 PM

## 2013-08-08 ENCOUNTER — Inpatient Hospital Stay (HOSPITAL_COMMUNITY): Payer: Medicare Other

## 2013-08-08 ENCOUNTER — Encounter (HOSPITAL_COMMUNITY): Payer: Self-pay | Admitting: Family Medicine

## 2013-08-08 DIAGNOSIS — J111 Influenza due to unidentified influenza virus with other respiratory manifestations: Secondary | ICD-10-CM

## 2013-08-08 DIAGNOSIS — J101 Influenza due to other identified influenza virus with other respiratory manifestations: Secondary | ICD-10-CM | POA: Diagnosis present

## 2013-08-08 DIAGNOSIS — Z87891 Personal history of nicotine dependence: Secondary | ICD-10-CM

## 2013-08-08 HISTORY — DX: Personal history of nicotine dependence: Z87.891

## 2013-08-08 LAB — URINE MICROSCOPIC-ADD ON

## 2013-08-08 LAB — CBC
HEMATOCRIT: 36 % — AB (ref 39.0–52.0)
Hemoglobin: 11.5 g/dL — ABNORMAL LOW (ref 13.0–17.0)
MCH: 27.1 pg (ref 26.0–34.0)
MCHC: 31.9 g/dL (ref 30.0–36.0)
MCV: 84.9 fL (ref 78.0–100.0)
Platelets: 72 10*3/uL — ABNORMAL LOW (ref 150–400)
RBC: 4.24 MIL/uL (ref 4.22–5.81)
RDW: 16.6 % — AB (ref 11.5–15.5)
WBC: 10.6 10*3/uL — AB (ref 4.0–10.5)

## 2013-08-08 LAB — PROTIME-INR
INR: 1.25 (ref 0.00–1.49)
PROTHROMBIN TIME: 15.4 s — AB (ref 11.6–15.2)

## 2013-08-08 LAB — BASIC METABOLIC PANEL
BUN: 75 mg/dL — ABNORMAL HIGH (ref 6–23)
CO2: 33 meq/L — AB (ref 19–32)
CREATININE: 1.72 mg/dL — AB (ref 0.50–1.35)
Calcium: 7.8 mg/dL — ABNORMAL LOW (ref 8.4–10.5)
Chloride: 105 mEq/L (ref 96–112)
GFR calc Af Amer: 42 mL/min — ABNORMAL LOW (ref >90)
GFR calc non Af Amer: 36 mL/min — ABNORMAL LOW (ref >90)
Glucose, Bld: 253 mg/dL — ABNORMAL HIGH (ref 70–99)
POTASSIUM: 3.4 meq/L — AB (ref 3.7–5.3)
Sodium: 146 mEq/L (ref 137–147)

## 2013-08-08 LAB — GLUCOSE, CAPILLARY
GLUCOSE-CAPILLARY: 234 mg/dL — AB (ref 70–99)
GLUCOSE-CAPILLARY: 297 mg/dL — AB (ref 70–99)
GLUCOSE-CAPILLARY: 238 mg/dL — AB (ref 70–99)
Glucose-Capillary: 220 mg/dL — ABNORMAL HIGH (ref 70–99)

## 2013-08-08 LAB — FERRITIN: Ferritin: 100 ng/mL (ref 22–322)

## 2013-08-08 LAB — URINALYSIS, ROUTINE W REFLEX MICROSCOPIC
BILIRUBIN URINE: NEGATIVE
Glucose, UA: NEGATIVE mg/dL
KETONES UR: NEGATIVE mg/dL
Leukocytes, UA: NEGATIVE
NITRITE: NEGATIVE
PROTEIN: NEGATIVE mg/dL
Specific Gravity, Urine: 1.012 (ref 1.005–1.030)
UROBILINOGEN UA: 0.2 mg/dL (ref 0.0–1.0)
pH: 5 (ref 5.0–8.0)

## 2013-08-08 LAB — HEMOGLOBIN A1C
Hgb A1c MFr Bld: 8.9 % — ABNORMAL HIGH (ref <5.7)
Mean Plasma Glucose: 209 mg/dL — ABNORMAL HIGH (ref <117)

## 2013-08-08 LAB — APTT: APTT: 31 s (ref 24–37)

## 2013-08-08 LAB — FOLATE: Folate: 11.2 ng/mL

## 2013-08-08 LAB — VITAMIN B12: Vitamin B-12: 933 pg/mL — ABNORMAL HIGH (ref 211–911)

## 2013-08-08 MED ORDER — VANCOMYCIN HCL IN DEXTROSE 750-5 MG/150ML-% IV SOLN
750.0000 mg | INTRAVENOUS | Status: DC
  Administered 2013-08-08: 750 mg via INTRAVENOUS
  Filled 2013-08-08 (×2): qty 150

## 2013-08-08 MED ORDER — LEVOFLOXACIN IN D5W 750 MG/150ML IV SOLN
750.0000 mg | INTRAVENOUS | Status: DC
  Administered 2013-08-08: 750 mg via INTRAVENOUS
  Filled 2013-08-08 (×2): qty 150

## 2013-08-08 MED ORDER — POTASSIUM CHLORIDE CRYS ER 20 MEQ PO TBCR
40.0000 meq | EXTENDED_RELEASE_TABLET | Freq: Once | ORAL | Status: AC
  Administered 2013-08-08: 40 meq via ORAL
  Filled 2013-08-08: qty 2

## 2013-08-08 NOTE — Progress Notes (Addendum)
Pt. Seen and examined. Agree with the NP/PA-C note as written. Weight is coming down and continues to diurese well. Would continue IV diuretics today. +Influenza A - continue Tamiflu and respiratory precautions. Recommend EP consult for evaluation of possible AICD upgrade at some point.  Replete K+ - 3.4 today, 40 MEQ given x 1.   Chrystie Nose, MD, [facility] Attending Cardiologist [facility]

## 2013-08-08 NOTE — Progress Notes (Signed)
SLP Cancellation Note  Patient Details Name: Frederick [name] MRN: [id number] DOB: Feb 20, 1932   Cancelled treatment: ST to f/u on 08/09/13.  RN reports increased coughing with NTL.  Aware patient recently diagnosed with influenza A.   Moreen Fowler MS, CCC-SLP [phone] [facility] 08/08/2013, 5:33 PM

## 2013-08-08 NOTE — Progress Notes (Signed)
ANTIBIOTIC CONSULT NOTE - INITIAL  Pharmacy Consult for vancomycin + levaquin Indication: rule out pneumonia  No Known Allergies  Patient Measurements: Height: 5\' 6"  (167.6 cm) Weight: 132 lb 7.9 oz (60.1 kg) IBW/kg (Calculated) : 63.8 Adjusted Body Weight:   Vital Signs: Temp: 99.1 F (37.3 C) (01/04 1532) Temp src: Oral (01/04 1532) BP: 112/56 mmHg (01/04 1532) Pulse Rate: 67 (01/04 1532) Intake/Output from previous day: 01/03 0701 - 01/04 0700 In: 680 [P.O.:680] Out: 2800 [Urine:2800] Intake/Output from this shift: Total I/O In: 120 [P.O.:120] Out: 1200 [Urine:1200]  Labs:  Recent Labs  08/06/13 0510 08/06/13 1333 08/07/13 0500 08/08/13 0400  WBC 11.0*  --  13.9* 10.6*  HGB 13.2  --  12.5* 11.5*  PLT 81*  --  69* 72*  LABCREA  --  78.13  --   --   CREATININE 2.05*  --  1.88* 1.72*   Estimated Creatinine Clearance: 29.6 ml/min (by C-G formula based on Cr of 1.72). No results found for this basename: VANCOTROUGH, Leodis Binet, VANCORANDOM, GENTTROUGH, GENTPEAK, GENTRANDOM, TOBRATROUGH, TOBRAPEAK, TOBRARND, AMIKACINPEAK, AMIKACINTROU, AMIKACIN,  in the last 72 hours   Microbiology: Recent Results (from the past 720 hour(s))  URINE CULTURE     Status: None   Collection Time    08/06/13  1:32 PM      Result Value Range Status   Specimen Description URINE, CATHETERIZED   Final   Special Requests NONE   Final   Culture  Setup Time     Final   Value: 08/06/2013 14:27     Performed at [facility] Count     Final   Value: NO GROWTH     Performed at [facility]   Culture     Final   Value: NO GROWTH     Performed at [facility]   Report Status 08/07/2013 FINAL   Final    Medical History: Past Medical History  Diagnosis Date  . HTN (hypertension) 08/06/2013  . Protein-calorie malnutrition, severe 08/06/2013  . Ex-cigarette smoker 08/08/2013    Medications:  Anti-infectives   Start     Dose/Rate Route Frequency Ordered Stop    08/08/13 1830  levofloxacin (LEVAQUIN) IVPB 750 mg     750 mg 100 mL/hr over 90 Minutes Intravenous Every 48 hours 08/08/13 1758     08/08/13 1830  vancomycin (VANCOCIN) IVPB 750 mg/150 ml premix     750 mg 150 mL/hr over 60 Minutes Intravenous Every 24 hours 08/08/13 1758     08/07/13 1700  oseltamivir (TAMIFLU) capsule 30 mg     30 mg Oral Daily 08/07/13 1554 08/12/13 0959   08/07/13 1545  oseltamivir (TAMIFLU) capsule 75 mg  Status:  Discontinued     75 mg Oral 2 times daily 08/07/13 1535 08/07/13 1552     Assessment: 81 yom presented to the hospital with SOB. To start empiric vancomycin + levaquin for possible pneumonia. Also started on tamiflu for influenza PCR+. PT is afebrile and WBC is slightly elevated at 10.6. Scr is elevated at 1.72 but has been trending down.  Vanc 1/4>> Levaquin 1/4>> Tamiflu 1/3>>  1/2 Urine - NEG 1/3 Flu PCR - POS  Goal of Therapy:  Vancomycin trough level 15-20 mcg/ml  Plan:  1. Levaquin 750mg  IV Q48H 2. Vancomycin 750mg  IV Q24H 3. F/u renal fxn, C&S, clinical status and trough at [facility]  Ruffus Kamaka, Drake Leach 08/08/2013,5:59 PM

## 2013-08-08 NOTE — Progress Notes (Signed)
Subjective: + influenza A , complains of cough  Objective: Vital signs in last 24 hours: Temp:  [98 F (36.7 C)-99.1 F (37.3 C)] 99.1 F (37.3 C) (01/04 0500) Pulse Rate:  [60-64] 60 (01/04 0500) Resp:  [16-18] 16 (01/04 0500) BP: (117-132)/(43-59) 117/43 mmHg (01/04 0500) SpO2:  [93 %-98 %] 98 % (01/04 0500) Weight:  [132 lb 7.9 oz (60.1 kg)] 132 lb 7.9 oz (60.1 kg) (01/04 0500) Weight change: 2 lb 6.8 oz (1.1 kg) Last BM Date: 08/07/13 Intake/Output from previous day: -2120 (-2540 since admit)  Wt 132.7 same?? 01/03 0701 - 01/04 0700 In: 680 [P.O.:680] Out: 2800 [Urine:2800] Intake/Output this shift:    PE: General:Pleasant affect, NAD Skin:Warm and dry, brisk capillary refill HEENT:normocephalic, sclera clear, mucus membranes moist Heart:S1S2 RRR without murmur, gallup, rub or click Lungs:diminished breath sounds,without rales, + rhonchi, no wheezes currently IWL:NLGX, non tender, + BS, do not palpate liver spleen or masses Ext:tr lower ext edema Neuro:alert and oriented, MAE, follows commands, + facial symmetry   Lab Results:  Recent Labs  08/07/13 0500 08/08/13 0400  WBC 13.9* 10.6*  HGB 12.5* 11.5*  HCT 38.5* 36.0*  PLT 69* 72*   BMET  Recent Labs  08/07/13 0500 08/08/13 0400  NA 146 146  K 4.2 3.4*  CL 109 105  CO2 25 33*  GLUCOSE 118* 253*  BUN 79* 75*  CREATININE 1.88* 1.72*  CALCIUM 7.9* 7.8*    Recent Labs  08/06/13 1445 08/06/13 2220  TROPONINI <0.30 <0.30    Lab Results  Component Value Date   CHOL 132 08/06/2013   HDL 54 08/06/2013   LDLCALC 60 08/06/2013   TRIG 91 08/06/2013   CHOLHDL 2.4 08/06/2013   No results found for this basename: HGBA1C     Lab Results  Component Value Date   TSH 0.651 08/06/2013    Hepatic Function Panel  Recent Labs  08/06/13 1121  PROT 5.9*  ALBUMIN 2.6*  AST 15  ALT 28  ALKPHOS 102  BILITOT 0.7  BILIDIR 0.2  IBILI 0.5    Recent Labs  08/06/13 1121  CHOL 132   No results  found for this basename: PROTIME,  in the last 72 hours     Studies/Results: Abd 1 View (kub)  08/06/2013   CLINICAL DATA:  Epigastric pain  EXAM: ABDOMEN - 1 VIEW  COMPARISON:  None.  FINDINGS: Air-filled loops of large small bowel are appreciated. There is mild levoscoliosis of the lumbar spine. Patient is status post bilateral hip replacement.  IMPRESSION: Nonspecific nonobstructive bowel gas pattern. An evolving ileus or small-bowel obstruction cannot be excluded and surveillance evaluation is recommended.   Electronically Signed   By: Salome Holmes M.D.   On: 08/06/2013 10:46   US Renal  08/06/2013   CLINICAL DATA:  Acute renal failure  EXAM: RENAL/URINARY TRACT ULTRASOUND COMPLETE  COMPARISON:  None.  FINDINGS: Right Kidney:  Length: 9.6 cm. There is slight increased echogenicity of the renal cortex and decreased corticomedullary differentiation. There is no evidence hydronephrosis. No sonographic evidence of solid or cystic masses nor calculi.  Left Kidney:  Length: 9.5 cm. Increased cortical echogenicity and decreased corticomedullary differentiation. A small 0.9 x 0.9 x 0.9 cm cyst is identified within the corticomedullary junction region of the lower pole. No further sonographic evidence of solid or cystic masses nor calculi. There is no evidence of hydronephrosis.  Bladder:  Appears normal for degree of bladder distention. A Foley  catheter is appreciated.  Bilateral pleural effusions are appreciated. A trace amount of bilateral perinephric fluid is appreciated.  IMPRESSION: 1. Increased echogenicity within the renal cortices without evidence of hydronephrosis, solid masses, nor calculi. Small benign appearing cyst left kidney. Trace amount of perinephric fluid bilateral questionable clinical significance. This may reflect sequela of small amount of abdominal ascites. 2. Bilateral effusions   Electronically Signed   By: Salome HolmesHector  Cooper M.D.   On: 08/06/2013 14:10    Medications: I have reviewed  the patient's current medications. Scheduled Meds: . aspirin EC  81 mg Oral Daily  . carvedilol  3.125 mg Oral BID WC  . feeding supplement (ENSURE COMPLETE)  237 mL Oral BID BM  . furosemide  40 mg Intravenous BID  . insulin aspart  0-9 Units Subcutaneous TID WC  . oseltamivir  30 mg Oral Daily  . pantoprazole  40 mg Oral Q0600  . sodium chloride  3 mL Intravenous Q12H  . sodium chloride  3 mL Intravenous Q12H   Continuous Infusions:  PRN Meds:.sodium chloride, acetaminophen, acetaminophen, gi cocktail, ipratropium-albuterol, ondansetron (ZOFRAN) IV, ondansetron, oxyCODONE, polyethylene glycol, RESOURCE THICKENUP CLEAR, sodium chloride, sorbitol  Assessment/Plan: Principal Problem:   Acute on chronic combined systolic and diastolic heart failure Active Problems:   Chest pain   HTN (hypertension)   Diabetes   COPD (chronic obstructive pulmonary disease)   Cardiac pacemaker in situ   ARF (acute renal failure)   Protein-calorie malnutrition, severe   Diarrhea   Thrombocytopenia, unspecified   Cough  Plan:+ flu, +productive cough, keeps him awake.  For CXR today.  VS stable.   LOS: 2 days   Time spent with pt. :15 minutes. North Austin Medical CenterNGOLD,LAURA R  Nurse Practitioner Certified Pager [phone] or after 5pm and on weekends call [phone] 08/08/2013, 9:30 AM

## 2013-08-08 NOTE — Progress Notes (Addendum)
[facility] PROGRESS NOTE  Frederick [name] BCW:[id number] DOB: October 22, 1931 DOA: 08/06/2013 PCP: No PCP Per Patient  Assessment/Plan: 80 y/o male with PMH of HTN, HPL, COPD, CHF nonischemic cardiomyopathy, s/p PPM presented with SOB, DOE cough  1. Acute on chronic CHF; systolic HF; echo (08/06/13):LVEF 20%, dilated cardiomyopathy  -cont diuresis per cardiology;  -04/19/13 LHC by Dr. Tresa Endo normal coronaries and severely reduced systolic function, suggesting he has nonischemic cardiomyopathy.   2. Influenza A; cont tamiflu; repeat CXT; afebrile;   3. COPD chronic; cont inhalers, repeat WTU:UEKCMKLK consolidation; add atx on 1/4   4. CKD II close monitor while on diuresis;   5. Anemia thrombocytopenia of unclear etiology; no s/s of acute bleeding;  -close monitor; pend peripheral smear   Family is deciding SNF vs HHC  DVT SCD; hold heparin due to risk of bleeding  Code Status: DNR Family Communication: d/w patient, called [place], [place] [phone] (indicate person spoken with, relationship, and if by phone, the number) Disposition Plan: snf    Consultants:  Cardiology   Procedures:  echo  Antibiotics:  vanc 1/4<<<<  Levofloxacin 1/4<<<<    (indicate start date, and stop date if known)  HPI/Subjective: alert  Objective: Filed Vitals:   08/08/13 0500  BP: 117/43  Pulse: 60  Temp: 99.1 F (37.3 C)  Resp: 16    Intake/Output Summary (Last 24 hours) at 08/08/13 0948 Last data filed at 08/08/13 0600  Gross per 24 hour  Intake    480 ml  Output   2800 ml  Net  -2320 ml   Filed Weights   08/06/13 0826 08/07/13 0407 08/08/13 0500  Weight: 59 kg (130 lb 1.1 oz) 60 kg (132 lb 4.4 oz) 60.1 kg (132 lb 7.9 oz)    Exam:   General:  alert  Cardiovascular: s1,s2 rrr  Respiratory: BL LL crackles   Abdomen: soft, nt, nd   Musculoskeletal: no LE edema   Data Reviewed: Basic Metabolic Panel:  Recent Labs Lab 08/06/13 0510 08/06/13 1121 08/07/13 0500  08/08/13 0400  NA 144  --  146 146  K 4.9  --  4.2 3.4*  CL 106  --  109 105  CO2 26  --  25 33*  GLUCOSE 292*  --  118* 253*  BUN 77*  --  79* 75*  CREATININE 2.05*  --  1.88* 1.72*  CALCIUM 8.1*  --  7.9* 7.8*  MG  --  2.6*  --   --   PHOS  --  5.3*  --   --    Liver Function Tests:  Recent Labs Lab 08/06/13 1121  AST 15  ALT 28  ALKPHOS 102  BILITOT 0.7  PROT 5.9*  ALBUMIN 2.6*   No results found for this basename: LIPASE, AMYLASE,  in the last 168 hours No results found for this basename: AMMONIA,  in the last 168 hours CBC:  Recent Labs Lab 08/06/13 0510 08/07/13 0500 08/08/13 0400  WBC 11.0* 13.9* 10.6*  NEUTROABS 9.8*  --   --   HGB 13.2 12.5* 11.5*  HCT 41.1 38.5* 36.0*  MCV 84.6 84.2 84.9  PLT 81* 69* 72*   Cardiac Enzymes:  Recent Labs Lab 08/06/13 0533 08/06/13 1121 08/06/13 1445 08/06/13 2220  TROPONINI <0.30 <0.30 <0.30 <0.30   BNP (last 3 results)  Recent Labs  08/06/13 1121  PROBNP >70000.0*   CBG:  Recent Labs Lab 08/07/13 0634 08/07/13 1108 08/07/13 1615 08/07/13 2124 08/08/13 0559  GLUCAP 141* 203* 248* 232*  220*    Recent Results (from the past 240 hour(s))  URINE CULTURE     Status: None   Collection Time    08/06/13  1:32 PM      Result Value Range Status   Specimen Description URINE, CATHETERIZED   Final   Special Requests NONE   Final   Culture  Setup Time     Final   Value: 08/06/2013 14:27     Performed at [facility] Count     Final   Value: NO GROWTH     Performed at [facility]   Culture     Final   Value: NO GROWTH     Performed at [facility]   Report Status 08/07/2013 FINAL   Final     Studies: Abd 1 View (kub)  08/06/2013   CLINICAL DATA:  Epigastric pain  EXAM: ABDOMEN - 1 VIEW  COMPARISON:  None.  FINDINGS: Air-filled loops of large small bowel are appreciated. There is mild levoscoliosis of the lumbar spine. Patient is status post bilateral hip replacement.   IMPRESSION: Nonspecific nonobstructive bowel gas pattern. An evolving ileus or small-bowel obstruction cannot be excluded and surveillance evaluation is recommended.   Electronically Signed   By: Salome Holmes M.D.   On: 08/06/2013 10:46   US Renal  08/06/2013   CLINICAL DATA:  Acute renal failure  EXAM: RENAL/URINARY TRACT ULTRASOUND COMPLETE  COMPARISON:  None.  FINDINGS: Right Kidney:  Length: 9.6 cm. There is slight increased echogenicity of the renal cortex and decreased corticomedullary differentiation. There is no evidence hydronephrosis. No sonographic evidence of solid or cystic masses nor calculi.  Left Kidney:  Length: 9.5 cm. Increased cortical echogenicity and decreased corticomedullary differentiation. A small 0.9 x 0.9 x 0.9 cm cyst is identified within the corticomedullary junction region of the lower pole. No further sonographic evidence of solid or cystic masses nor calculi. There is no evidence of hydronephrosis.  Bladder:  Appears normal for degree of bladder distention. A Foley catheter is appreciated.  Bilateral pleural effusions are appreciated. A trace amount of bilateral perinephric fluid is appreciated.  IMPRESSION: 1. Increased echogenicity within the renal cortices without evidence of hydronephrosis, solid masses, nor calculi. Small benign appearing cyst left kidney. Trace amount of perinephric fluid bilateral questionable clinical significance. This may reflect sequela of small amount of abdominal ascites. 2. Bilateral effusions   Electronically Signed   By: Salome Holmes M.D.   On: 08/06/2013 14:10    Scheduled Meds: . aspirin EC  81 mg Oral Daily  . carvedilol  3.125 mg Oral BID WC  . feeding supplement (ENSURE COMPLETE)  237 mL Oral BID BM  . furosemide  40 mg Intravenous BID  . insulin aspart  0-9 Units Subcutaneous TID WC  . oseltamivir  30 mg Oral Daily  . pantoprazole  40 mg Oral Q0600  . sodium chloride  3 mL Intravenous Q12H  . sodium chloride  3 mL Intravenous  Q12H   Continuous Infusions:   Principal Problem:   Acute on chronic combined systolic and diastolic heart failure Active Problems:   Chest pain   HTN (hypertension)   Diabetes   COPD (chronic obstructive pulmonary disease)   Cardiac pacemaker in situ   ARF (acute renal failure)   Protein-calorie malnutrition, severe   Diarrhea   Thrombocytopenia, unspecified   Cough    Time spent: >35 minutes     Jonette Mate N  [facility] Pager  [phone]. If 7PM-7AM, please contact night-coverage at [email or web address], password [email or web address] 08/08/2013, 9:48 AM  LOS: 2 days

## 2013-08-09 DIAGNOSIS — E119 Type 2 diabetes mellitus without complications: Secondary | ICD-10-CM

## 2013-08-09 LAB — CBC
HEMATOCRIT: 34.4 % — AB (ref 39.0–52.0)
Hemoglobin: 10.6 g/dL — ABNORMAL LOW (ref 13.0–17.0)
MCH: 27 pg (ref 26.0–34.0)
MCHC: 30.8 g/dL (ref 30.0–36.0)
MCV: 87.8 fL (ref 78.0–100.0)
Platelets: 76 10*3/uL — ABNORMAL LOW (ref 150–400)
RBC: 3.92 MIL/uL — AB (ref 4.22–5.81)
RDW: 16.8 % — ABNORMAL HIGH (ref 11.5–15.5)
WBC: 9.2 10*3/uL (ref 4.0–10.5)

## 2013-08-09 LAB — GLUCOSE, CAPILLARY
GLUCOSE-CAPILLARY: 226 mg/dL — AB (ref 70–99)
GLUCOSE-CAPILLARY: 306 mg/dL — AB (ref 70–99)
GLUCOSE-CAPILLARY: 157 mg/dL — AB (ref 70–99)
Glucose-Capillary: 199 mg/dL — ABNORMAL HIGH (ref 70–99)

## 2013-08-09 LAB — BASIC METABOLIC PANEL
BUN: 59 mg/dL — AB (ref 6–23)
CHLORIDE: 106 meq/L (ref 96–112)
CO2: 38 mEq/L — ABNORMAL HIGH (ref 19–32)
Calcium: 7.6 mg/dL — ABNORMAL LOW (ref 8.4–10.5)
Creatinine, Ser: 1.35 mg/dL (ref 0.50–1.35)
GFR calc non Af Amer: 48 mL/min — ABNORMAL LOW (ref >90)
GFR, EST AFRICAN AMERICAN: 56 mL/min — AB (ref >90)
Glucose, Bld: 222 mg/dL — ABNORMAL HIGH (ref 70–99)
POTASSIUM: 3.3 meq/L — AB (ref 3.7–5.3)
SODIUM: 151 meq/L — AB (ref 137–147)

## 2013-08-09 MED ORDER — POTASSIUM CHLORIDE CRYS ER 20 MEQ PO TBCR
40.0000 meq | EXTENDED_RELEASE_TABLET | Freq: Once | ORAL | Status: AC
  Administered 2013-08-09: 40 meq via ORAL
  Filled 2013-08-09: qty 2

## 2013-08-09 MED ORDER — FUROSEMIDE 40 MG PO TABS
40.0000 mg | ORAL_TABLET | Freq: Two times a day (BID) | ORAL | Status: DC
  Administered 2013-08-09 – 2013-08-10 (×3): 40 mg via ORAL
  Filled 2013-08-09 (×4): qty 1

## 2013-08-09 MED ORDER — OSELTAMIVIR PHOSPHATE 75 MG PO CAPS
75.0000 mg | ORAL_CAPSULE | Freq: Every day | ORAL | Status: DC
  Administered 2013-08-10: 75 mg via ORAL
  Filled 2013-08-09: qty 1

## 2013-08-09 MED ORDER — VANCOMYCIN HCL IN DEXTROSE 1-5 GM/200ML-% IV SOLN
1000.0000 mg | INTRAVENOUS | Status: DC
  Administered 2013-08-09: 1000 mg via INTRAVENOUS
  Filled 2013-08-09 (×2): qty 200

## 2013-08-09 MED ORDER — INSULIN GLARGINE 100 UNIT/ML ~~LOC~~ SOLN
5.0000 [IU] | Freq: Every day | SUBCUTANEOUS | Status: DC
  Administered 2013-08-09 – 2013-08-10 (×2): 5 [IU] via SUBCUTANEOUS
  Filled 2013-08-09 (×2): qty 0.05

## 2013-08-09 NOTE — Progress Notes (Signed)
Subjective: Feeling better. SOB improved. Still with nonproductive cough.   Objective: Vital signs in last 24 hours: Temp:  [98 F (36.7 C)-99.1 F (37.3 C)] 98 F (36.7 C) (01/05 0349) Pulse Rate:  [63-68] 63 (01/05 0349) Resp:  [18] 18 (01/05 0349) BP: (112-134)/(42-56) 130/42 mmHg (01/05 0349) SpO2:  [95 %-98 %] 98 % (01/05 0349) Weight:  [133 lb 2.5 oz (60.4 kg)] 133 lb 2.5 oz (60.4 kg) (01/05 0500) Last BM Date: 08/07/13  Intake/Output from previous day: 01/04 0701 - 01/05 0700 In: 120 [P.O.:120] Out: 2625 [Urine:2625] Intake/Output this shift:    Medications Current Facility-Administered Medications  Medication Dose Route Frequency Provider Last Rate Last Dose  . 0.9 %  sodium chloride infusion  250 mL Intravenous PRN Rodolph Bong, MD      . acetaminophen (TYLENOL) tablet 650 mg  650 mg Oral Q6H PRN Rodolph Bong, MD       Or  . acetaminophen (TYLENOL) suppository 650 mg  650 mg Rectal Q6H PRN Rodolph Bong, MD      . aspirin EC tablet 81 mg  81 mg Oral Daily Rodolph Bong, MD   81 mg at 08/08/13 0948  . carvedilol (COREG) tablet 3.125 mg  3.125 mg Oral BID WC Rodolph Bong, MD   3.125 mg at 08/09/13 0641  . feeding supplement (ENSURE COMPLETE) (ENSURE COMPLETE) liquid 237 mL  237 mL Oral BID BM Rodolph Bong, MD   237 mL at 08/08/13 1605  . furosemide (LASIX) injection 40 mg  40 mg Intravenous BID Christiane Ha, MD   40 mg at 08/08/13 1736  . gi cocktail (Maalox,Lidocaine,Donnatal)  30 mL Oral TID PRN Rodolph Bong, MD      . insulin aspart (novoLOG) injection 0-9 Units  0-9 Units Subcutaneous TID WC Rodolph Bong, MD   2 Units at 08/09/13 [phone]  . ipratropium-albuterol (DUONEB) 0.5-2.5 (3) MG/3ML nebulizer solution 3 mL  3 mL Nebulization Q2H PRN Rodolph Bong, MD      . levofloxacin [facility]) IVPB 750 mg  750 mg Intravenous Q48H Drake Leach Rumbarger, RPH   750 mg at 08/08/13 2159  . ondansetron (ZOFRAN) tablet 4 mg  4 mg  Oral Q6H PRN Rodolph Bong, MD       Or  . ondansetron [facility]) injection 4 mg  4 mg Intravenous Q6H PRN Rodolph Bong, MD      . oseltamivir (TAMIFLU) capsule 30 mg  30 mg Oral Daily Christiane Ha, MD   30 mg at 08/08/13 0949  . oxyCODONE (Oxy IR/ROXICODONE) immediate release tablet 5 mg  5 mg Oral Q4H PRN Rodolph Bong, MD   5 mg at 08/06/13 2202  . pantoprazole (PROTONIX) EC tablet 40 mg  40 mg Oral Q0600 Rodolph Bong, MD   40 mg at 08/09/13 [phone]  . polyethylene glycol (MIRALAX / GLYCOLAX) packet 17 g  17 g Oral Daily PRN Rodolph Bong, MD      . RESOURCE THICKENUP CLEAR   Oral PRN Rodolph Bong, MD      . sodium chloride 0.9 % injection 3 mL  3 mL Intravenous Q12H Rodolph Bong, MD   3 mL at 08/08/13 0949  . sodium chloride 0.9 % injection 3 mL  3 mL Intravenous Q12H Rodolph Bong, MD   3 mL at 08/08/13 2204  . sodium chloride 0.9 % injection 3 mL  3 mL Intravenous PRN  Rodolph Bonganiel V Thompson, MD      . sorbitol 70 % solution 30 mL  30 mL Oral Daily PRN Rodolph Bonganiel V Thompson, MD      . vancomycin (VANCOCIN) IVPB 750 mg/150 ml premix  750 mg Intravenous Q24H Drake Leachachel Lynn Rumbarger, RPH   750 mg at 08/08/13 1956    PE: General appearance: alert, cooperative, cachectic and no distress Lungs: decreased breathsounds bilaterally Heart: regular rate and rhythm Extremities: no LEE Pulses: 2+ and symmetric Skin: warm and dry Neurologic: Grossly normal  Lab Results:   Recent Labs  08/07/13 0500 08/08/13 0400 08/09/13 0413  WBC 13.9* 10.6* 9.2  HGB 12.5* 11.5* 10.6*  HCT 38.5* 36.0* 34.4*  PLT 69* 72* 76*   BMET  Recent Labs  08/07/13 0500 08/08/13 0400 08/09/13 0413  NA 146 146 151*  K 4.2 3.4* 3.3*  CL 109 105 106  CO2 25 33* 38*  GLUCOSE 118* 253* 222*  BUN 79* 75* 59*  CREATININE 1.88* 1.72* 1.35  CALCIUM 7.9* 7.8* 7.6*   PT/INR  Recent Labs  08/08/13 0400  LABPROT 15.4*  INR 1.25   Cholesterol  Recent Labs  08/06/13 1121  CHOL  132   Cardiac Panel (last 3 results)  Recent Labs  08/06/13 1121 08/06/13 1445 08/06/13 2220  TROPONINI <0.30 <0.30 <0.30    BNP (last 3 results)  Recent Labs  08/06/13 1121  PROBNP >70000.0*    Assessment/Plan  Principal Problem:   Acute on chronic combined systolic and diastolic heart failure Active Problems:   Chest pain   HTN (hypertension)   Diabetes   COPD (chronic obstructive pulmonary disease)   Cardiac pacemaker in situ   ARF (acute renal failure)   Protein-calorie malnutrition, severe   Diarrhea   Thrombocytopenia, unspecified   Cough   Influenza A  Plan: Admitted for acute on chronic systolic HF. H/o dilated CM. Diuresing well. -5L net total. Pt reports significant subjective improvement. Last BNP was assessed 3 days ago and was elevated at > 70 K. Will recheck today to assess fluid status. LEE resolved. Decreased breath sounds bilaterally. Renal function has improved with SCr of 1.35 (1.72 yesterday). ? If he is near point to transition from IV to PO Lasix. He is hypokalemic. Will replete potassium.     LOS: 3 days    Brittainy M. Delmer IslamSimmons, PA-C 08/09/2013 8:39 AM  Agree with above assessment.  Patient has diuresed well.  There is only minimal ankle edema persisting.  Renal function is improving.  We will plan to transition him to oral furosemide today.  Patient still has a moderately severe nonproductive cough.

## 2013-08-09 NOTE — Progress Notes (Signed)
Speech Language Pathology Treatment: Dysphagia  Patient Details Name: Frederick [name] MRN: [id number] DOB: 01/24/1932 Today's Date: 08/09/2013 Time: 8338-2505 SLP Time Calculation (min): 12 min  Assessment / Plan / Recommendation Clinical Impression  Pt. Easily aroused for dysphagia treatment with mild baseline coughing noted.  Mild delayed cough increased following sips nectar juice, however pt. Consuming large sips and required continued, moderate verbal and visual cues for smaller sips which he eventually was able to attain.  Difficult to determine baseline cough with current flu versus decreased airway protection.  Reviewed strategies to decrease aspiration risk.  Recommend continue Dys 2 and nectar and will attempt liquid upgrade when deemed appropriate.   HPI HPI: 80 y.o. male admitted with chest pain, SOB, acute renal failure.  Questionable acute on chronic CHF exacerbation.  Primary doctors in [place].  RN reported continued coughing with nectar thick liquids 1/3.     Pertinent Vitals WDL  SLP Plan  Continue with current plan of care    Recommendations Diet recommendations: Dysphagia 2 (fine chop);Nectar-thick liquid Liquids provided via: Cup;No straw Medication Administration: Whole meds with puree Supervision: Patient able to self feed;Full supervision/cueing for compensatory strategies Compensations: Small sips/bites;Check for pocketing              Plan: Continue with current plan of care    GO     Breck Coons [place] M.Ed [employer] [phone]  08/09/2013

## 2013-08-09 NOTE — Progress Notes (Signed)
OT Cancellation Note  Patient Details Name: Frederick [name] MRN: [id number] DOB: Aug 30, 1931   Cancelled Treatment:    Reason Eval/Treat Not Completed: Other (comment). Pt eating lunch right now and requested OT return later. Will re attempt late as time allows  Galen Manila 08/09/2013, 2:28 PM

## 2013-08-09 NOTE — Progress Notes (Signed)
Clinical Social Work Department BRIEF PSYCHOSOCIAL ASSESSMENT 08/09/2013  Patient:  Frederick [name], Frederick [name]     Account Number:  [id number]     Admit date:  08/06/2013  Clinical Social Worker:  Frederick [name]  Date/Time:  08/09/2013 11:06 AM  Referred by:  Physician  Date Referred:  08/08/2013 Referred for  SNF Placement   Other Referral:   Interview type:  Family Other interview type:   CSW spoke to patient's wife and patient's son over the phone    PSYCHOSOCIAL DATA Living Status:  WIFE Admitted from facility:   Level of care:   Primary support name:  Frederick [name] Primary support relationship to patient:  SPOUSE Degree of support available:   Good    CURRENT CONCERNS Current Concerns  Post-Acute Placement   Other Concerns:    SOCIAL WORK ASSESSMENT / PLAN Clinical Social Worker received referral for SNF placement at d/c. CSW received phone call from patient's wife and son. Patient's wife and son had social worker on speaker phone and stated that they want to set up Home Health for patient, instead of a short term rehab. CSW explained what short term rehab was compared to [facility] and patient's wife and son stated they understood but wanted to take patient home. CSW referred this on to CM who will help family set up Home Health. CSW signing off at this time.   Assessment/plan status:  No Further Intervention Required Other assessment/ plan:   Information/referral to community resources:   SNF information    PATIENT'S/FAMILY'S RESPONSE TO PLAN OF CARE: Patient'w wife wants Home Health.       Frederick [name], MSW, Frederick [name] [phone]

## 2013-08-09 NOTE — Progress Notes (Deleted)
Clinical Social Work Department CLINICAL SOCIAL WORK PLACEMENT NOTE 08/09/2013  Patient:  MAKBEL, WHITTUM  Account Number:  [id number] Admit date:  08/06/2013  Clinical Social Worker:  Carren Rang  Date/time:  08/09/2013 10:10 AM  Clinical Social Work is seeking post-discharge placement for this patient at the following level of care:   SKILLED NURSING   (*CSW will update this form in Epic as items are completed)   08/09/2013  Patient/family provided with [facility] Department of Clinical Social Work's list of facilities offering this level of care within the geographic area requested by the patient (or if unable, by the patient's family).  08/09/2013  Patient/family informed of their freedom to choose among providers that offer the needed level of care, that participate in Medicare, Medicaid or managed care program needed by the patient, have an available bed and are willing to accept the patient.  08/09/2013  Patient/family informed of [facility]' ownership interest in [facility], as well as of the fact that they are under no obligation to receive care at this facility.  PASARR submitted to EDS on  PASARR number received from EDS on   FL2 transmitted to all facilities in geographic area requested by pt/family on  08/08/2013 FL2 transmitted to all facilities within larger geographic area on   Patient informed that his/her managed care company has contracts with or will negotiate with  certain facilities, including the following:     Patient/family informed of bed offers received:   Patient chooses bed at  Physician recommends and patient chooses bed at    Patient to be transferred to  on   Patient to be transferred to facility by   The following physician request were entered in Epic:   Additional Comments:  [name], MSW, [employer] [phone]

## 2013-08-09 NOTE — Evaluation (Signed)
Occupational Therapy Evaluation Patient Details Name: Frederick [name] MRN: [id number] DOB: 07-07-1932 Today's Date: 08/09/2013 Time: 1771-1657 OT Time Calculation (min): 21 min  OT Assessment / Plan / Recommendation History of present illness Pt is a 80 y.o. male adm from home secondary to SOB and acute renal failure. Pt with acute on chronic systolic CHF (EF15-20%). Pt with hx of DM, HTN, COPD s/p PPM. Pt has experienced one week of of worsening SOB and mid sternal/epigastric chest pain.     Clinical Impression   Pt demos decline in function with ADLs and ADL mobility safety with decreased strength, balance and endurance. Pt's family would like to take hi home with [facility], uncertain HH therapy is adequate given pt's current level of care or if family can provide 24 hour assist/sup. Pt states that his wife is in a w/c and that he cares for her at home. Pt would benefit from acute OT services to address impairments to increase level of function and safety. Recommend short term SNF, however pt's family wants [facility]     OT Assessment  Patient needs continued OT Services    Follow Up Recommendations  SNF;Supervision/Assistance - 24 hour;Home health OT    Barriers to Discharge   Uncertain of pt's family can provide 24 hour assis/sup, but they would like to take him home with home health. OT expained to pt the difference between [facility] therapy vs SNF therapy  Equipment Recommendations       Recommendations for Other Services    Frequency  Min 2X/week    Precautions / Restrictions Precautions Precautions: Fall Precaution Comments: pt reports he has fallen 4 times since Christmas. droplet precautions   Restrictions Weight Bearing Restrictions: No   Pertinent Vitals/Pain No c/o pain    ADL  Grooming: Performed;Wash/dry hands;Wash/dry face;Min guard Where Assessed - Grooming: Unsupported sitting Upper Body Bathing: Simulated;Minimal assistance Lower Body Bathing: Maximal assistance;Simulated;Moderate  assistance Upper Body Dressing: Performed;Minimal assistance Lower Body Dressing: +1 Total assistance Toilet Transfer: Simulated;Moderate assistance Toilet Transfer Method: Sit to stand Toileting - Clothing Manipulation and Hygiene: +1 Total assistance Where Assessed - Toileting Clothing Manipulation and Hygiene: Standing Tub/Shower Transfer Method: Not assessed Equipment Used: Gait belt Transfers/Ambulation Related to ADLs: (A) maintain balance and facilitate weight shift and pivot into chair; max cues for hand placement and sequencing  ADL Comments: pt requires extensive assist with ADLs due to decreased balance and weakness    OT Diagnosis: Generalized weakness  OT Problem List: Decreased strength;Impaired balance (sitting and/or standing);Decreased activity tolerance;Decreased knowledge of use of DME or AE OT Treatment Interventions: Self-care/ADL training;DME and/or AE instruction;Therapeutic activities;Balance training;Therapeutic exercise;Patient/family education;Neuromuscular education   OT Goals(Current goals can be found in the care plan section) Acute Rehab OT Goals Patient Stated Goal: to stop coughing and be able to go home OT Goal Formulation: With patient Potential to Achieve Goals: Good ADL Goals Pt Will Perform Grooming: with supervision;with set-up;sitting;with min guard assist;standing Pt Will Perform Upper Body Bathing: with min guard assist;with supervision;with set-up;sitting Pt Will Perform Lower Body Bathing: with mod assist;with min assist;with caregiver independent in assisting;sitting/lateral leans;sit to/from stand Pt Will Perform Upper Body Dressing: with min guard assist;with supervision;with set-up;sitting;standing Pt Will Perform Lower Body Dressing: with max assist;with mod assist;with caregiver independent in assisting;sitting/lateral leans;sit to/from stand Pt Will Transfer to Toilet: with min assist;regular height toilet;ambulating;bedside commode;grab  bars Pt Will Perform Toileting - Clothing Manipulation and hygiene: with mod assist;with max assist;sit to/from stand;with caregiver independent in assisting Additional ADL Goal #1: Pt  will complete bed mobility with min A to sit EOB in prep for ADLs  Visit Information  Last OT Received On: 08/09/13 Assistance Needed: +1 Reason Eval/Treat Not Completed: Other (comment) History of Present Illness: Pt is a 80 y.o. male adm from home secondary to SOB and acute renal failure. Pt with acute on chronic systolic CHF (EF15-20%). Pt with hx of DM, HTN, COPD s/p PPM. Pt has experienced one week of of worsening SOB and mid sternal/epigastric chest pain.         Prior Functioning     Home Living Family/patient expects to be discharged to:: Private residence Living Arrangements: Spouse/significant other Available Help at Discharge: Family;Available PRN/intermittently Type of Home: Apartment Home Access: Level entry Home Layout: One level Home Equipment: Bedside commode;Shower seat;Cane - single point Additional Comments: pt ambulates with SPC; pt is caregiver for wife  Prior Function Level of Independence: Independent with assistive device(s) Comments: pt drives; is caregiver for wife who is in w/c Communication Communication: No difficulties Dominant Hand: Right         Vision/Perception Vision - History Baseline Vision: Wears glasses only for reading Patient Visual Report: No change from baseline Perception Perception: Within Functional Limits   Cognition  Cognition Arousal/Alertness: Awake/alert Behavior During Therapy: WFL for tasks assessed/performed    Extremity/Trunk Assessment Upper Extremity Assessment Upper Extremity Assessment: Overall WFL for tasks assessed;Generalized weakness Lower Extremity Assessment Lower Extremity Assessment: Defer to PT evaluation Cervical / Trunk Assessment Cervical / Trunk Assessment: Normal     Mobility Bed Mobility Bed Mobility:  Supine to Sit;Sitting - Scoot to WatchtowerEdge of Bed;Scooting to [facility];Sit to Supine Supine to Sit: 4: Min assist;HOB elevated;With rails;3: Mod assist Sitting - Scoot to Edge of Bed: 4: Min assist;With rail Sit to Supine: 4: Min assist Scooting to [facility]: 4: Min assist Details for Bed Mobility Assistance: (A) to bring to sitting  position at EOB due to generalized weakness  Transfers Transfers: Sit to Stand;Stand to Sit Sit to Stand: 3: Mod assist;From bed;With upper extremity assist;From chair/3-in-1 Stand to Sit: 3: Mod assist;To chair/3-in-1;With armrests;With upper extremity assist;To bed Details for Transfer Assistance: (A) maintain balance and facilitate weight shift and pivot into chair; max cues for hand placement and sequencing      Exercise     Balance Balance Balance Assessed: Yes Static Sitting Balance Static Sitting - Balance Support: Feet supported;Bilateral upper extremity supported Static Sitting - Level of Assistance: 5: Stand by assistance Dynamic Sitting Balance Dynamic Sitting - Balance Support: No upper extremity supported;Feet supported;During functional activity Dynamic Sitting - Level of Assistance: Other (comment) (min guard A) Dynamic Standing Balance Dynamic Standing - Balance Support: Left upper extremity supported;During functional activity Dynamic Standing - Level of Assistance: 4: Min assist;3: Mod assist   End of Session OT - End of Session Equipment Utilized During Treatment: Gait belt Activity Tolerance: Patient limited by fatigue Patient left: in bed;with call bell/phone within reach  GO     Galen ManilaSpencer, Omri Bertran Jeanette 08/09/2013, 3:18 PM

## 2013-08-09 NOTE — Clinical Documentation Improvement (Signed)
Possible Clinical Conditions?   _______Diabetes Type 1  or 2 _______Controlled or Uncontrolled  Manifestations:  _______DM retinopathy  _______DM PVD _______DM neuropathy   _______DM nephropathy _______Other Condition _______Cannot Clinically determine    Risk Factors: Patient with a history of diabetes mellitus noted per 01/02 H&P.  Diagnostics: 01/04:  Hgb A1C: 8.9 Mean plasma glucose: 209 01/03: glucose, capillary: 248  Thank Binnie Kand Clinical Documentation Specialist:  [phone]  [facility]- Health Information Management

## 2013-08-09 NOTE — Progress Notes (Addendum)
[facility] PROGRESS NOTE  Verner Reames APO:[id number] DOB: [birth year] DOA: 08/06/2013 PCP: No PCP Per Patient  Assessment/Plan: 80 y/o male with PMH of HTN, HPL, COPD, CHF nonischemic cardiomyopathy, s/p PPM presented with SOB, DOE cough  1. Acute on chronic CHF; systolic HF; echo (08/06/13):LVEF 20%, dilated cardiomyopathy  -04/19/13 LHC by Dr. Tresa Endo normal coronaries and severely reduced systolic function, suggesting he has nonischemic cardiomyopathy. -improved on Iv diuresis; hyper Na, contraction alkalosis; consider holding diuretics today   2. Influenza A; cont tamiflu; repeat CXT; afebrile;   3. COPD chronic; cont inhalers, repeat YHO:[id number] consolidation; add atx on 1/4   4. CKD II close monitor while on diuresis;   5. Anemia thrombocytopenia of unclear etiology; no s/s of acute bleeding;  -close monitor; pend peripheral smear   6. DM : ha1c-8.9; started ISS+ lantus; titrate per response   Family is deciding SNF vs HHC  DVT SCD; hold heparin due to risk of bleeding  Code Status: DNR Family Communication: d/w patient, called Abraham, [place] [phone] (indicate person spoken with, relationship, and if by phone, the number) Disposition Plan: snf    Consultants:  Cardiology   Procedures:  echo  Antibiotics:  vanc 1/4<<<<  Levofloxacin 1/4<<<<    (indicate start date, and stop date if known)  HPI/Subjective: alert  Objective: Filed Vitals:   08/09/13 0349  BP: 130/42  Pulse: 63  Temp: 98 F (36.7 C)  Resp: 18    Intake/Output Summary (Last 24 hours) at 08/09/13 0946 Last data filed at 08/09/13 0600  Gross per 24 hour  Intake      0 ml  Output   2625 ml  Net  -2625 ml   Filed Weights   08/07/13 0407 08/08/13 0500 08/09/13 0500  Weight: 60 kg (132 lb 4.4 oz) 60.1 kg (132 lb 7.9 oz) 60.4 kg (133 lb 2.5 oz)    Exam:   General:  alert  Cardiovascular: s1,s2 rrr  Respiratory: BL LL crackles   Abdomen: soft, nt, nd   Musculoskeletal:  no LE edema   Data Reviewed: Basic Metabolic Panel:  Recent Labs Lab 08/06/13 0510 08/06/13 1121 08/07/13 0500 08/08/13 0400 08/09/13 0413  NA 144  --  146 146 151*  K 4.9  --  4.2 3.4* 3.3*  CL 106  --  109 105 106  CO2 26  --  25 33* 38*  GLUCOSE 292*  --  118* 253* 222*  BUN 77*  --  79* 75* 59*  CREATININE 2.05*  --  1.88* 1.72* 1.35  CALCIUM 8.1*  --  7.9* 7.8* 7.6*  MG  --  2.6*  --   --   --   PHOS  --  5.3*  --   --   --    Liver Function Tests:  Recent Labs Lab 08/06/13 1121  AST 15  ALT 28  ALKPHOS 102  BILITOT 0.7  PROT 5.9*  ALBUMIN 2.6*   No results found for this basename: LIPASE, AMYLASE,  in the last 168 hours No results found for this basename: AMMONIA,  in the last 168 hours CBC:  Recent Labs Lab 08/06/13 0510 08/07/13 0500 08/08/13 0400 08/09/13 0413  WBC 11.0* 13.9* 10.6* 9.2  NEUTROABS 9.8*  --   --   --   HGB 13.2 12.5* 11.5* 10.6*  HCT 41.1 38.5* 36.0* 34.4*  MCV 84.6 84.2 84.9 87.8  PLT 81* 69* 72* 76*   Cardiac Enzymes:  Recent Labs Lab 08/06/13 0533 08/06/13 1121 08/06/13  1445 08/06/13 2220  TROPONINI <0.30 <0.30 <0.30 <0.30   BNP (last 3 results)  Recent Labs  08/06/13 1121  PROBNP >70000.0*   CBG:  Recent Labs Lab 08/08/13 0559 08/08/13 1102 08/08/13 1611 08/08/13 2104 08/09/13 0631  GLUCAP 220* 234* 297* 238* 199*    Recent Results (from the past 240 hour(s))  URINE CULTURE     Status: None   Collection Time    08/06/13  1:32 PM      Result Value Range Status   Specimen Description URINE, CATHETERIZED   Final   Special Requests NONE   Final   Culture  Setup Time     Final   Value: 08/06/2013 14:27     [facility]formed at [facility]   [place] Count     Final   Value: NO GROWTH     [facility]formed at [facility]   Culture     Final   Value: NO GROWTH     Performed at [facility]   Report Status 08/07/2013 FINAL   Final     Studies: Dg Chest Port 1 View  08/08/2013   CLINICAL  DATA:  Cough  EXAM: PORTABLE CHEST - 1 VIEW  COMPARISON:  08/06/2013  FINDINGS: Mild cardiac enlargement is stable. Left lower lobe opacity suggests a combination of effusion and consolidation. This represents a change when compared to the prior study, as it is worse. Vascular pattern unchanged. Mild hazy density right lung base suggests mild atelectasis.  IMPRESSION: Increased left lower lobe opacity suggesting a combination of consolidation and pleural effusion.   Electronically Signed   By: Esperanza Heiraymond  Rubner M.D.   On: 08/08/2013 16:20    Scheduled Meds: . aspirin EC  81 mg Oral Daily  . carvedilol  3.125 mg Oral BID WC  . feeding supplement (ENSURE COMPLETE)  237 mL Oral BID BM  . furosemide  40 mg Intravenous BID  . insulin aspart  0-9 Units Subcutaneous TID WC  . levofloxacin (LEVAQUIN) IV  750 mg Intravenous Q48H  . oseltamivir  30 mg Oral Daily  . pantoprazole  40 mg Oral Q0600  . potassium chloride  40 mEq Oral Once  . sodium chloride  3 mL Intravenous Q12H  . sodium chloride  3 mL Intravenous Q12H  . vancomycin  750 mg Intravenous Q24H   Continuous Infusions:   Principal Problem:   Acute on chronic combined systolic and diastolic heart failure Active Problems:   Chest pain   HTN (hypertension)   Diabetes   COPD (chronic obstructive pulmonary disease)   Cardiac pacemaker in situ   ARF (acute renal failure)   Protein-calorie malnutrition, severe   Diarrhea   Thrombocytopenia, unspecified   Cough   Influenza A    Time spent: >35 minutes     Esperanza SheetsBURIEV, Kynzlie Hilleary N  [facility] Pager [phone]. If 7PM-7AM, please contact night-coverage at [email or web address], password [email or web address] 08/09/2013, 9:46 AM  LOS: 3 days

## 2013-08-09 NOTE — Progress Notes (Signed)
ANTIBIOTIC CONSULT NOTE - Follow up   Pharmacy Consult for vancomycin + levaquin Indication: rule out pneumonia  No Known Allergies  Patient Measurements: Height: 5\' 6"  (167.6 cm) Weight: 133 lb 2.5 oz (60.4 kg) IBW/kg (Calculated) : 63.8 Adjusted Body Weight:   Vital Signs: Temp: 98 F (36.7 C) (01/05 0349) Temp src: Oral (01/05 0349) BP: 130/42 mmHg (01/05 0349) Pulse Rate: 63 (01/05 0349) Intake/Output from previous day: 01/04 0701 - 01/05 0700 In: 120 [P.O.:120] Out: 2625 [Urine:2625] Intake/Output from this shift: Total I/O In: 200 [P.O.:200] Out: 300 [Urine:300]  Labs:  Recent Labs  08/06/13 1333 08/07/13 0500 08/08/13 0400 08/09/13 0413  WBC  --  13.9* 10.6* 9.2  HGB  --  12.5* 11.5* 10.6*  PLT  --  69* 72* 76*  LABCREA 78.13  --   --   --   CREATININE  --  1.88* 1.72* 1.35   Estimated Creatinine Clearance: 37.9 ml/min (by C-G formula based on Cr of 1.35). No results found for this basename: VANCOTROUGH, VANCOPEAK, VANCORANDOM, GENTTROUGH, GENTPEAK, GENTRANDOM, TOBRATROUGH, TOBRAPEAK, TOBRARND, AMIKACINPEAK, AMIKACINTROU, AMIKACIN,  in the last 72 hours    Assessment: 89 yom presented to the hospital with SOB. Currently on empiric vancomycin + levaquin for possible pneumonia. Also started on tamiflu for influenza PCR+. PT is afebrile and WBC has trended down to 9.2. Scr trended down to 1.35.  Vanc 1/4>> Levaquin 1/4>> Tamiflu 1/3>>  1/2 Urine - NEG 1/3 Flu PCR - POS  Goal of Therapy:  Vancomycin trough level 15-20 mcg/ml  Plan:  1. Levaquin 750mg  IV Q48H 2. Increase Vancomycin to 1000mg  IV Q24H 3. F/u renal fxn, C&S, clinical status and trough at [facility], PharmD.  Clinical Pharmacist Pager [phone]

## 2013-08-10 DIAGNOSIS — R7309 Other abnormal glucose: Secondary | ICD-10-CM

## 2013-08-10 LAB — CBC
HCT: 36.5 % — ABNORMAL LOW (ref 39.0–52.0)
HEMOGLOBIN: 11.5 g/dL — AB (ref 13.0–17.0)
MCH: 27.3 pg (ref 26.0–34.0)
MCHC: 31.5 g/dL (ref 30.0–36.0)
MCV: 86.5 fL (ref 78.0–100.0)
Platelets: 75 10*3/uL — ABNORMAL LOW (ref 150–400)
RBC: 4.22 MIL/uL (ref 4.22–5.81)
RDW: 16.8 % — AB (ref 11.5–15.5)
WBC: 9.2 10*3/uL (ref 4.0–10.5)

## 2013-08-10 LAB — BASIC METABOLIC PANEL
BUN: 46 mg/dL — AB (ref 6–23)
CO2: 38 mEq/L — ABNORMAL HIGH (ref 19–32)
CREATININE: 1.2 mg/dL (ref 0.50–1.35)
Calcium: 7.8 mg/dL — ABNORMAL LOW (ref 8.4–10.5)
Chloride: 104 mEq/L (ref 96–112)
GFR, EST AFRICAN AMERICAN: 65 mL/min — AB (ref >90)
GFR, EST NON AFRICAN AMERICAN: 56 mL/min — AB (ref >90)
GLUCOSE: 151 mg/dL — AB (ref 70–99)
Potassium: 3.9 mEq/L (ref 3.7–5.3)
Sodium: 149 mEq/L — ABNORMAL HIGH (ref 137–147)

## 2013-08-10 LAB — GLUCOSE, CAPILLARY
Glucose-Capillary: 159 mg/dL — ABNORMAL HIGH (ref 70–99)
Glucose-Capillary: 201 mg/dL — ABNORMAL HIGH (ref 70–99)
Glucose-Capillary: 249 mg/dL — ABNORMAL HIGH (ref 70–99)

## 2013-08-10 MED ORDER — OXYCODONE HCL 5 MG PO TABS: 5.0000 mg | ORAL_TABLET | ORAL | Status: DC | PRN

## 2013-08-10 MED ORDER — LEVOFLOXACIN 500 MG PO TABS: 500.0000 mg | ORAL_TABLET | Freq: Every day | ORAL | Status: DC

## 2013-08-10 MED ORDER — OSELTAMIVIR PHOSPHATE 30 MG PO CAPS: 30.0000 mg | ORAL_CAPSULE | Freq: Two times a day (BID) | ORAL | Status: DC

## 2013-08-10 MED ORDER — ASPIRIN 81 MG PO TBEC: 81.0000 mg | DELAYED_RELEASE_TABLET | Freq: Every day | ORAL | Status: DC

## 2013-08-10 MED ORDER — FUROSEMIDE 40 MG PO TABS: 40.0000 mg | ORAL_TABLET | Freq: Two times a day (BID) | ORAL | Status: AC

## 2013-08-10 MED ORDER — GLIPIZIDE 5 MG PO TABS: 2.5000 mg | ORAL_TABLET | Freq: Every day | ORAL | Status: AC

## 2013-08-10 NOTE — Progress Notes (Signed)
Patient's O2 saturation on RA at rest was 83%. Recovery O2 saturation with 2L O2 was 92% at rest. Will continue to monitor closely. Lajuana Matte, RN

## 2013-08-10 NOTE — Discharge Summary (Signed)
Physician Discharge Summary  Frederick [name] WJX:[id number] DOB: Dec 04, 1931 DOA: 08/06/2013  PCP: No PCP Per Patient  Admit date: 08/06/2013 Discharge date: 08/10/2013  Time spent: >35 minutes  Recommendations for Outpatient Follow-up:  F/U with HHC HF F/u with PCP in 1 week Discharge Diagnoses:  Principal Problem:   Acute on chronic combined systolic and diastolic heart failure Active Problems:   Chest pain   HTN (hypertension)   Diabetes   COPD (chronic obstructive pulmonary disease)   Cardiac pacemaker in situ   ARF (acute renal failure)   Protein-calorie malnutrition, severe   Diarrhea   Thrombocytopenia, unspecified   Cough   Influenza A   Discharge Condition: stable   Diet recommendation: DM  Filed Weights   08/08/13 0500 08/09/13 0500 08/10/13 0401  Weight: 60.1 kg (132 lb 7.9 oz) 60.4 kg (133 lb 2.5 oz) 57.2 kg (126 lb 1.7 oz)    History of present illness:  80 y/o male with PMH of HTN, HPL, COPD, CHF nonischemic cardiomyopathy, s/p PPM presented with SOB, DOE cough   Hospital Course:  1. Acute on chronic CHF; systolic HF; echo (08/06/13):LVEF 20%, dilated cardiomyopathy  -04/19/13 LHC by Dr. Tresa Endo normal coronaries and severely reduced systolic function, suggesting he has nonischemic cardiomyopathy.  -improved on IV diuresis; I/O neg 7.0 L; changed to PO diuretics per cards; HHC/HF upon d/c   2. Influenza A; cont tamiflu; repeat CXT; afebrile;  3. COPD chronic; cont inhalers, repeat [id number] consolidation; add atx on 1/4  4. CKD II close monitor OP;  5. Anemia thrombocytopenia of unclear etiology; no s/s of acute bleeding;  6. DM : ha1c-8.9; started PO meds; OP f/u  Patient has declined, and PT recommended SNF; but family, patient refused, wants to go home; will arrange HHC/HF follow up  -unfortunately due to worsening HF/ESHD high risk for decline in near future    Procedures:  ech.e. Studies not automatically included, echos, thoracentesis, etc; not  x-rays)  Consultations: carduiology  Discharge Exam: Filed Vitals:   08/10/13 0401  BP: 147/55  Pulse: 69  Temp: 98.9 F (37.2 C)  Resp: 17    General: alert Cardiovascular: s1,s2 rrr Respiratory: few crackle sLL improved   Discharge Instructions  Discharge Orders   Future Orders Complete By Expires   Diet - low sodium heart healthy  As directed    Discharge instructions  As directed    Comments:     Please follow up with primary care doctor in 1 week   Increase activity slowly  As directed        Medication List         aspirin 81 MG EC tablet  Take 1 tablet (81 mg total) by mouth daily.     carvedilol 3.125 MG tablet  Commonly known as:  COREG  Take 3.125 mg by mouth 2 (two) times daily with a meal.     furosemide 40 MG tablet  Commonly known as:  LASIX  Take 1 tablet (40 mg total) by mouth 2 (two) times daily.     glipiZIDE 5 MG tablet  Commonly known as:  GLUCOTROL  Take 0.5 tablets (2.5 mg total) by mouth daily before breakfast.     levofloxacin 500 MG tablet  Commonly known as:  LEVAQUIN  Take 1 tablet (500 mg total) by mouth daily.     lisinopril 10 MG tablet  Commonly known as:  PRINIVIL,ZESTRIL  Take 10 mg by mouth daily.     oxyCODONE 5 MG immediate release  tablet  Commonly known as:  Oxy IR/ROXICODONE  Take 1 tablet (5 mg total) by mouth every 4 (four) hours as needed for moderate pain.       No Known Allergies     Follow-up Information   Follow up with No PCP Per Patient.   Specialty:  General Practice   Contact information:   [address] [place] [address] [phone]       Follow up with [facility]     In 1 week.   Contact information:   [address] [place] [phone]      Follow up with [facility] In 1 week.   Specialty:  Cardiology   Contact information:   [id number] [address] [place]  [id number] [phone]       The results of significant diagnostics from this hospitalization (including imaging, microbiology, ancillary and laboratory) are listed below for reference.    Significant Diagnostic Studies: Abd 1 View (kub)  08/06/2013   CLINICAL DATA:  Epigastric pain  EXAM: ABDOMEN - 1 VIEW  COMPARISON:  None.  FINDINGS: Air-filled loops of large small bowel are appreciated. There is mild levoscoliosis of the lumbar spine. Patient is status post bilateral hip replacement.  IMPRESSION: Nonspecific nonobstructive bowel gas pattern. An evolving ileus or small-bowel obstruction cannot be excluded and surveillance evaluation is recommended.   Electronically Signed   By: Salome HolmesHector  Cooper M.D.   On: 08/06/2013 10:46   [place] Renal  08/06/2013   CLINICAL DATA:  Acute renal failure  EXAM: RENAL/URINARY TRACT ULTRASOUND COMPLETE  COMPARISON:  None.  FINDINGS: Right Kidney:  Length: 9.6 cm. There is slight increased echogenicity of the renal cortex and decreased corticomedullary differentiation. There is no evidence hydronephrosis. No sonographic evidence of solid or cystic masses nor calculi.  Left Kidney:  Length: 9.5 cm. Increased cortical echogenicity and decreased corticomedullary differentiation. A small 0.9 x 0.9 x 0.9 cm cyst is identified within the corticomedullary junction region of the lower pole. No further sonographic evidence of solid or cystic masses nor calculi. There is no evidence of hydronephrosis.  Bladder:  Appears normal for degree of bladder distention. A Foley catheter is appreciated.  Bilateral pleural effusions are appreciated. A trace amount of bilateral perinephric fluid is appreciated.  IMPRESSION: 1. Increased echogenicity within the renal cortices without evidence of hydronephrosis, solid masses, nor calculi. Small benign appearing cyst left kidney. Trace amount of perinephric fluid bilateral questionable clinical significance. This may reflect sequela of small amount of  abdominal ascites. 2. Bilateral effusions   Electronically Signed   By: Salome HolmesHector  Cooper M.D.   On: 08/06/2013 14:10   Dg Chest Port 1 View  08/08/2013   CLINICAL DATA:  Cough  EXAM: PORTABLE CHEST - 1 VIEW  COMPARISON:  08/06/2013  FINDINGS: Mild cardiac enlargement is stable. Left lower lobe opacity suggests a combination of effusion and consolidation. This represents a change when compared to the prior study, as it is worse. Vascular pattern unchanged. Mild hazy density right lung base suggests mild atelectasis.  IMPRESSION: Increased left lower lobe opacity suggesting a combination of consolidation and pleural effusion.   Electronically Signed   By: Esperanza Heiraymond  Rubner M.D.   On: 08/08/2013 16:20   Dg Chest Portable 1 View  08/06/2013   CLINICAL DATA:  Shortness of breath and fatigue.  EXAM: PORTABLE CHEST - 1 VIEW  COMPARISON:  None.  FINDINGS: Cardiac pacemaker.  Shallow inspiration. Cardiac enlargement without vascular congestion. No blunting of costophrenic angles. No pneumothorax. Emphysematous changes in the lungs. Peribronchial thickening suggesting chronic bronchitis.  IMPRESSION: Cardiac enlargement.  No evidence of active pulmonary disease appear   Electronically Signed   By: Burman Nieves M.D.   On: 08/06/2013 05:44    Microbiology: Recent Results (from the past 240 hour(s))  URINE CULTURE     Status: None   Collection Time    08/06/13  1:32 PM      Result Value Range Status   Specimen Description URINE, CATHETERIZED   Final   Special Requests NONE   Final   Culture  Setup Time     Final   Value: 08/06/2013 14:27     Performed at [facility] Count     Final   Value: NO GROWTH     Performed at [facility]   Culture     Final   Value: NO GROWTH     Performed at [facility]   Report Status 08/07/2013 FINAL   Final     Labs: Basic Metabolic Panel:  Recent Labs Lab 08/06/13 0510 08/06/13 1121 08/07/13 0500 08/08/13 0400 08/09/13 0413  08/10/13 0350  NA 144  --  146 146 151* 149*  K 4.9  --  4.2 3.4* 3.3* 3.9  CL 106  --  109 105 106 104  CO2 26  --  25 33* 38* 38*  GLUCOSE 292*  --  118* 253* 222* 151*  BUN 77*  --  79* 75* 59* 46*  CREATININE 2.05*  --  1.88* 1.72* 1.35 1.20  CALCIUM 8.1*  --  7.9* 7.8* 7.6* 7.8*  MG  --  2.6*  --   --   --   --   PHOS  --  5.3*  --   --   --   --    Liver Function Tests:  Recent Labs Lab 08/06/13 1121  AST 15  ALT 28  ALKPHOS 102  BILITOT 0.7  PROT 5.9*  ALBUMIN 2.6*   No results found for this basename: LIPASE, AMYLASE,  in the last 168 hours No results found for this basename: AMMONIA,  in the last 168 hours CBC:  Recent Labs Lab 08/06/13 0510 08/07/13 0500 08/08/13 0400 08/09/13 0413 08/10/13 0350  WBC 11.0* 13.9* 10.6* 9.2 9.2  NEUTROABS 9.8*  --   --   --   --   HGB 13.2 12.5* 11.5* 10.6* 11.5*  HCT 41.1 38.5* 36.0* 34.4* 36.5*  MCV 84.6 84.2 84.9 87.8 86.5  PLT 81* 69* 72* 76* 75*   Cardiac Enzymes:  Recent Labs Lab 08/06/13 0533 08/06/13 1121 08/06/13 1445 08/06/13 2220  TROPONINI <0.30 <0.30 <0.30 <0.30   BNP: BNP (last 3 results)  Recent Labs  08/06/13 1121  PROBNP >70000.0*   CBG:  Recent Labs Lab 08/09/13 0631 08/09/13 1137 08/09/13 1636 08/09/13 2151 08/10/13 0625  GLUCAP 199* 226* 306* 157* 159*       Signed:  Jonette Mate N  [facility] 08/10/2013, 8:57 AM

## 2013-08-10 NOTE — Progress Notes (Signed)
Patient Name: Frederick [name] Date of Encounter: 08/10/2013     Principal Problem:   Acute on chronic combined systolic and diastolic heart failure Active Problems:   Chest pain   HTN (hypertension)   Diabetes   COPD (chronic obstructive pulmonary disease)   Cardiac pacemaker in situ   ARF (acute renal failure)   Protein-calorie malnutrition, severe   Diarrhea   Thrombocytopenia, unspecified   Cough   Influenza A    SUBJECTIVE   The patient feels better.  Less dyspneic.  Rhythm is stable normal sinus rhythm with intermittent atrial pacing and with occasional PACs and PVCs.  Diuresing well with weight down 5 pounds overnight.  CURRENT MEDS . aspirin EC  81 mg Oral Daily  . carvedilol  3.125 mg Oral BID WC  . feeding supplement (ENSURE COMPLETE)  237 mL Oral BID BM  . furosemide  40 mg Oral BID  . insulin aspart  0-9 Units Subcutaneous TID WC  . insulin glargine  5 Units Subcutaneous Daily  . levofloxacin (LEVAQUIN) IV  750 mg Intravenous Q48H  . oseltamivir  75 mg Oral Daily  . pantoprazole  40 mg Oral Q0600  . sodium chloride  3 mL Intravenous Q12H  . sodium chloride  3 mL Intravenous Q12H  . vancomycin  1,000 mg Intravenous Q24H    OBJECTIVE  Filed Vitals:   08/09/13 0500 08/09/13 1408 08/09/13 2154 08/10/13 0401  BP:  143/44 130/45 147/55  Pulse:  85 73 69  Temp:  98.3 F (36.8 C) 98.5 F (36.9 C) 98.9 F (37.2 C)  TempSrc:  Oral Oral Oral  Resp:  18 20 17   Height:      Weight: 133 lb 2.5 oz (60.4 kg)   126 lb 1.7 oz (57.2 kg)  SpO2:  98% 92% 95%    Intake/Output Summary (Last 24 hours) at 08/10/13 1124 Last data filed at 08/10/13 1038  Gross per 24 hour  Intake    440 ml  Output   2175 ml  Net  -1735 ml   Filed Weights   08/08/13 0500 08/09/13 0500 08/10/13 0401  Weight: 132 lb 7.9 oz (60.1 kg) 133 lb 2.5 oz (60.4 kg) 126 lb 1.7 oz (57.2 kg)    PHYSICAL EXAM  General: Pleasant, NAD. Neuro: Alert and oriented X 3. Moves all extremities  spontaneously. Psych: Normal affect. HEENT:  Normal  Neck: Supple without bruits or JVD. Lungs:  Resp regular and unlabored, CTA.  Pacemaker in left upper chest. Heart: RRR no s3, s4, or murmurs. Abdomen: Soft, non-tender, non-distended, BS + x 4.  Extremities: No clubbing, cyanosis or edema. DP/PT/Radials 2+ and equal bilaterally.  Accessory Clinical Findings  CBC  Recent Labs  08/09/13 0413 08/10/13 0350  WBC 9.2 9.2  HGB 10.6* 11.5*  HCT 34.4* 36.5*  MCV 87.8 86.5  PLT 76* 75*   Basic Metabolic Panel  Recent Labs  08/09/13 0413 08/10/13 0350  NA 151* 149*  K 3.3* 3.9  CL 106 104  CO2 38* 38*  GLUCOSE 222* 151*  BUN 59* 46*  CREATININE 1.35 1.20  CALCIUM 7.6* 7.8*   Liver Function Tests No results found for this basename: AST, ALT, ALKPHOS, BILITOT, PROT, ALBUMIN,  in the last 72 hours No results found for this basename: LIPASE, AMYLASE,  in the last 72 hours Cardiac Enzymes No results found for this basename: CKTOTAL, CKMB, CKMBINDEX, TROPONINI,  in the last 72 hours BNP No components found with this basename: POCBNP,  D-Dimer  No results found for this basename: DDIMER,  in the last 72 hours Hemoglobin A1C  Recent Labs  08/08/13 0400  HGBA1C 8.9*   Fasting Lipid Panel No results found for this basename: CHOL, HDL, LDLCALC, TRIG, CHOLHDL, LDLDIRECT,  in the last 72 hours Thyroid Function Tests No results found for this basename: TSH, T4TOTAL, FREET3, T3FREE, THYROIDAB,  in the last 72 hours  TELE  Normal sinus rhythm with intermittent atrial pacing.  Frequent PAC and PVC.  ECG    Radiology/Studies  Abd 1 View (kub)  08/06/2013   CLINICAL DATA:  Epigastric pain  EXAM: ABDOMEN - 1 VIEW  COMPARISON:  None.  FINDINGS: Air-filled loops of large small bowel are appreciated. There is mild levoscoliosis of the lumbar spine. Patient is status post bilateral hip replacement.  IMPRESSION: Nonspecific nonobstructive bowel gas pattern. An evolving ileus or  small-bowel obstruction cannot be excluded and surveillance evaluation is recommended.   Electronically Signed   By: Salome Holmes M.D.   On: 08/06/2013 10:46   US Renal  08/06/2013   CLINICAL DATA:  Acute renal failure  EXAM: RENAL/URINARY TRACT ULTRASOUND COMPLETE  COMPARISON:  None.  FINDINGS: Right Kidney:  Length: 9.6 cm. There is slight increased echogenicity of the renal cortex and decreased corticomedullary differentiation. There is no evidence hydronephrosis. No sonographic evidence of solid or cystic masses nor calculi.  Left Kidney:  Length: 9.5 cm. Increased cortical echogenicity and decreased corticomedullary differentiation. A small 0.9 x 0.9 x 0.9 cm cyst is identified within the corticomedullary junction region of the lower pole. No further sonographic evidence of solid or cystic masses nor calculi. There is no evidence of hydronephrosis.  Bladder:  Appears normal for degree of bladder distention. A Foley catheter is appreciated.  Bilateral pleural effusions are appreciated. A trace amount of bilateral perinephric fluid is appreciated.  IMPRESSION: 1. Increased echogenicity within the renal cortices without evidence of hydronephrosis, solid masses, nor calculi. Small benign appearing cyst left kidney. Trace amount of perinephric fluid bilateral questionable clinical significance. This may reflect sequela of small amount of abdominal ascites. 2. Bilateral effusions   Electronically Signed   By: Salome Holmes M.D.   On: 08/06/2013 14:10   Dg Chest Port 1 View  08/08/2013   CLINICAL DATA:  Cough  EXAM: PORTABLE CHEST - 1 VIEW  COMPARISON:  08/06/2013  FINDINGS: Mild cardiac enlargement is stable. Left lower lobe opacity suggests a combination of effusion and consolidation. This represents a change when compared to the prior study, as it is worse. Vascular pattern unchanged. Mild hazy density right lung base suggests mild atelectasis.  IMPRESSION: Increased left lower lobe opacity suggesting a  combination of consolidation and pleural effusion.   Electronically Signed   By: Esperanza Heir M.D.   On: 08/08/2013 16:20   Dg Chest Portable 1 View  08/06/2013   CLINICAL DATA:  Shortness of breath and fatigue.  EXAM: PORTABLE CHEST - 1 VIEW  COMPARISON:  None.  FINDINGS: Cardiac pacemaker. Shallow inspiration. Cardiac enlargement without vascular congestion. No blunting of costophrenic angles. No pneumothorax. Emphysematous changes in the lungs. Peribronchial thickening suggesting chronic bronchitis.  IMPRESSION: Cardiac enlargement.  No evidence of active pulmonary disease appear   Electronically Signed   By: Burman Nieves M.D.   On: 08/06/2013 05:44    ASSESSMENT AND PLAN  Acute on chronic combined systolic and diastolic heart failure, improved and now euvolemic on oral Lasix.   Plan: Patient will be discharged today.  He will follow  up with his primary cardiologist Dr. Allyson SabalBerry.   Signed, Cassell Clementhomas Jaquila Santelli NP

## 2013-08-10 NOTE — Discharge Instructions (Signed)
[facility] to provide HHRN, HHPT, HHOT, and Home aide.  They will call you to notify you of your first appt.   Phone:  [phone]

## 2013-08-10 NOTE — Progress Notes (Signed)
Physical Therapy Treatment Patient Details Name: Frederick [name] MRN: [id number] DOB: [birth year] Today's Date: 08/10/2013 Time: 4599-7741 PT Time Calculation (min): 27 min  PT Assessment / Plan / Recommendation  History of Present Illness Pt is a 80 y.o. male adm from home secondary to SOB and acute renal failure. Pt with acute on chronic systolic CHF (EF15-20%). Pt with hx of DM, HTN, COPD s/p PPM. Pt has experienced one week of of worsening SOB and mid sternal/epigastric chest pain.     PT Comments   Pt is improving, but still very weak and deconditioned with dyspnea on exertion.  Pt would benefit from further rehab and recooperation before going home.  SNF is the best venue at this point.   Follow Up Recommendations  SNF;Supervision/Assistance - 24 hour     Does the patient have the potential to tolerate intense rehabilitation     Barriers to Discharge        Equipment Recommendations  Other (comment) (question whether pt needs RW)    Recommendations for Other Services    Frequency Min 2X/week   Progress towards PT Goals Progress towards PT goals: Progressing toward goals  Plan Current plan remains appropriate    Precautions / Restrictions Precautions Precautions: Fall   Pertinent Vitals/Pain Unable to get reading, dyspnea on exertion    Mobility  Bed Mobility Bed Mobility: Supine to Sit;Sitting - Scoot to Edge of Bed Supine to Sit: 4: Min assist;With rails;HOB flat Sitting - Scoot to [place] of Bed: 4: Min assist;With rail Details for Bed Mobility Assistance: assist to come up via R elbow Transfers Transfers: Sit to Stand;Stand to Sit Sit to Stand: 3: Mod assist;With upper extremity assist;From bed;From toilet Stand to Sit: 3: Mod assist;With upper extremity assist;To chair/3-in-1;To toilet Details for Transfer Assistance: cues for hand placement, assist to lift and help come forward.  more difficult on lower surfaces Ambulation/Gait Ambulation/Gait Assistance: 4: Min  assist Ambulation Distance (Feet): 15 Feet (times 2) Assistive device: Rolling walker Ambulation/Gait Assistance Details: weak-kneed gait, shuffled steps, flexed posture. Stairs: No Wheelchair Mobility Wheelchair Mobility: No    Exercises     PT Diagnosis:    PT Problem List:   PT Treatment Interventions:     PT Goals (current goals can now be found in the care plan section) Acute Rehab PT Goals Time For Goal Achievement: 08/21/13 Potential to Achieve Goals: Good  Visit Information  Last PT Received On: 08/10/13 Assistance Needed: +1 History of Present Illness: Pt is a 80 y.o. male adm from home secondary to SOB and acute renal failure. Pt with acute on chronic systolic CHF (EF15-20%). Pt with hx of DM, HTN, COPD s/p PPM. Pt has experienced one week of of worsening SOB and mid sternal/epigastric chest pain.      Subjective Data  Subjective: I think it will be hard too (going home today)   Cognition  Cognition Arousal/Alertness: Awake/alert Behavior During Therapy: WFL for tasks assessed/performed Overall Cognitive Status: Impaired/Different from baseline Area of Impairment: Safety/judgement;Problem solving Safety/Judgement: Decreased awareness of deficits    Balance  Balance Balance Assessed: Yes Static Sitting Balance Static Sitting - Balance Support: Bilateral upper extremity supported;Feet supported Static Sitting - Level of Assistance: 5: Stand by assistance;Other (comment) (bent over with heavy UE assist)  End of Session PT - End of Session Equipment Utilized During Treatment: Gait belt;Oxygen Activity Tolerance: Patient limited by fatigue Patient left: in chair;with call bell/phone within reach Nurse Communication: Mobility status   GP  [name], Eliseo GumKenneth V 08/10/2013, 5:04 PM 08/10/2013  [place] BingKen Cassidi Modesitt, PT [phone]  (pager)

## 2013-08-11 ENCOUNTER — Encounter (HOSPITAL_COMMUNITY): Payer: Self-pay | Admitting: Cardiology

## 2013-08-12 ENCOUNTER — Inpatient Hospital Stay (HOSPITAL_COMMUNITY)
Admission: EM | Admit: 2013-08-12 | Discharge: 2013-08-17 | DRG: 193 | Disposition: A | Discharge status: Home Health | Payer: Medicare Other | Attending: Internal Medicine | Admitting: Internal Medicine

## 2013-08-12 ENCOUNTER — Emergency Department (HOSPITAL_COMMUNITY): Payer: Medicare Other

## 2013-08-12 ENCOUNTER — Encounter (HOSPITAL_COMMUNITY): Payer: Self-pay | Admitting: Emergency Medicine

## 2013-08-12 DIAGNOSIS — D62 Acute posthemorrhagic anemia: Secondary | ICD-10-CM | POA: Diagnosis present

## 2013-08-12 DIAGNOSIS — I509 Heart failure, unspecified: Secondary | ICD-10-CM | POA: Diagnosis present

## 2013-08-12 DIAGNOSIS — E119 Type 2 diabetes mellitus without complications: Secondary | ICD-10-CM | POA: Diagnosis present

## 2013-08-12 DIAGNOSIS — I82619 Acute embolism and thrombosis of superficial veins of unspecified upper extremity: Secondary | ICD-10-CM | POA: Diagnosis present

## 2013-08-12 DIAGNOSIS — J449 Chronic obstructive pulmonary disease, unspecified: Secondary | ICD-10-CM | POA: Diagnosis present

## 2013-08-12 DIAGNOSIS — R0602 Shortness of breath: Secondary | ICD-10-CM

## 2013-08-12 DIAGNOSIS — Z87891 Personal history of nicotine dependence: Secondary | ICD-10-CM

## 2013-08-12 DIAGNOSIS — E162 Hypoglycemia, unspecified: Secondary | ICD-10-CM | POA: Diagnosis present

## 2013-08-12 DIAGNOSIS — I428 Other cardiomyopathies: Secondary | ICD-10-CM | POA: Diagnosis present

## 2013-08-12 DIAGNOSIS — J4489 Other specified chronic obstructive pulmonary disease: Secondary | ICD-10-CM | POA: Diagnosis present

## 2013-08-12 DIAGNOSIS — R079 Chest pain, unspecified: Secondary | ICD-10-CM

## 2013-08-12 DIAGNOSIS — N182 Chronic kidney disease, stage 2 (mild): Secondary | ICD-10-CM | POA: Diagnosis present

## 2013-08-12 DIAGNOSIS — R0989 Other specified symptoms and signs involving the circulatory and respiratory systems: Secondary | ICD-10-CM | POA: Diagnosis present

## 2013-08-12 DIAGNOSIS — J101 Influenza due to other identified influenza virus with other respiratory manifestations: Secondary | ICD-10-CM

## 2013-08-12 DIAGNOSIS — R05 Cough: Secondary | ICD-10-CM

## 2013-08-12 DIAGNOSIS — E1169 Type 2 diabetes mellitus with other specified complication: Secondary | ICD-10-CM | POA: Diagnosis present

## 2013-08-12 DIAGNOSIS — I420 Dilated cardiomyopathy: Secondary | ICD-10-CM

## 2013-08-12 DIAGNOSIS — R600 Localized edema: Secondary | ICD-10-CM

## 2013-08-12 DIAGNOSIS — IMO0002 Reserved for concepts with insufficient information to code with codable children: Secondary | ICD-10-CM

## 2013-08-12 DIAGNOSIS — J9600 Acute respiratory failure, unspecified whether with hypoxia or hypercapnia: Secondary | ICD-10-CM | POA: Diagnosis present

## 2013-08-12 DIAGNOSIS — R627 Adult failure to thrive: Secondary | ICD-10-CM | POA: Diagnosis present

## 2013-08-12 DIAGNOSIS — R197 Diarrhea, unspecified: Secondary | ICD-10-CM

## 2013-08-12 DIAGNOSIS — I272 Pulmonary hypertension, unspecified: Secondary | ICD-10-CM

## 2013-08-12 DIAGNOSIS — E43 Unspecified severe protein-calorie malnutrition: Secondary | ICD-10-CM | POA: Diagnosis present

## 2013-08-12 DIAGNOSIS — I959 Hypotension, unspecified: Secondary | ICD-10-CM | POA: Diagnosis present

## 2013-08-12 DIAGNOSIS — Z7982 Long term (current) use of aspirin: Secondary | ICD-10-CM

## 2013-08-12 DIAGNOSIS — Z950 Presence of cardiac pacemaker: Secondary | ICD-10-CM

## 2013-08-12 DIAGNOSIS — I5043 Acute on chronic combined systolic (congestive) and diastolic (congestive) heart failure: Secondary | ICD-10-CM | POA: Diagnosis present

## 2013-08-12 DIAGNOSIS — N179 Acute kidney failure, unspecified: Secondary | ICD-10-CM

## 2013-08-12 DIAGNOSIS — I129 Hypertensive chronic kidney disease with stage 1 through stage 4 chronic kidney disease, or unspecified chronic kidney disease: Secondary | ICD-10-CM | POA: Diagnosis present

## 2013-08-12 DIAGNOSIS — I2789 Other specified pulmonary heart diseases: Secondary | ICD-10-CM | POA: Diagnosis present

## 2013-08-12 DIAGNOSIS — I10 Essential (primary) hypertension: Secondary | ICD-10-CM | POA: Diagnosis present

## 2013-08-12 DIAGNOSIS — H9190 Unspecified hearing loss, unspecified ear: Secondary | ICD-10-CM | POA: Diagnosis present

## 2013-08-12 DIAGNOSIS — R1310 Dysphagia, unspecified: Secondary | ICD-10-CM

## 2013-08-12 DIAGNOSIS — I5041 Acute combined systolic (congestive) and diastolic (congestive) heart failure: Secondary | ICD-10-CM

## 2013-08-12 DIAGNOSIS — R059 Cough, unspecified: Secondary | ICD-10-CM

## 2013-08-12 DIAGNOSIS — E876 Hypokalemia: Secondary | ICD-10-CM | POA: Diagnosis present

## 2013-08-12 DIAGNOSIS — D696 Thrombocytopenia, unspecified: Secondary | ICD-10-CM | POA: Diagnosis present

## 2013-08-12 DIAGNOSIS — R040 Epistaxis: Secondary | ICD-10-CM | POA: Diagnosis present

## 2013-08-12 DIAGNOSIS — R1312 Dysphagia, oropharyngeal phase: Secondary | ICD-10-CM | POA: Diagnosis present

## 2013-08-12 DIAGNOSIS — Z8249 Family history of ischemic heart disease and other diseases of the circulatory system: Secondary | ICD-10-CM

## 2013-08-12 DIAGNOSIS — J189 Pneumonia, unspecified organism: Principal | ICD-10-CM | POA: Diagnosis present

## 2013-08-12 DIAGNOSIS — Z66 Do not resuscitate: Secondary | ICD-10-CM | POA: Diagnosis present

## 2013-08-12 LAB — GLUCOSE, CAPILLARY
GLUCOSE-CAPILLARY: 116 mg/dL — AB (ref 70–99)
GLUCOSE-CAPILLARY: 153 mg/dL — AB (ref 70–99)
GLUCOSE-CAPILLARY: 181 mg/dL — AB (ref 70–99)
GLUCOSE-CAPILLARY: 181 mg/dL — AB (ref 70–99)
Glucose-Capillary: 196 mg/dL — ABNORMAL HIGH (ref 70–99)

## 2013-08-12 LAB — CBC WITH DIFFERENTIAL/PLATELET
BASOS PCT: 0 % (ref 0–1)
Basophils Absolute: 0 10*3/uL (ref 0.0–0.1)
EOS ABS: 0 10*3/uL (ref 0.0–0.7)
EOS PCT: 0 % (ref 0–5)
HCT: 45.3 % (ref 39.0–52.0)
HEMOGLOBIN: 13.8 g/dL (ref 13.0–17.0)
LYMPHS ABS: 0.6 10*3/uL — AB (ref 0.7–4.0)
Lymphocytes Relative: 6 % — ABNORMAL LOW (ref 12–46)
MCH: 26.8 pg (ref 26.0–34.0)
MCHC: 30.5 g/dL (ref 30.0–36.0)
MCV: 88.1 fL (ref 78.0–100.0)
MONO ABS: 0.7 10*3/uL (ref 0.1–1.0)
MONOS PCT: 7 % (ref 3–12)
NEUTROS PCT: 87 % — AB (ref 43–77)
Neutro Abs: 8.2 10*3/uL — ABNORMAL HIGH (ref 1.7–7.7)
Platelets: 70 10*3/uL — ABNORMAL LOW (ref 150–400)
RBC: 5.14 MIL/uL (ref 4.22–5.81)
RDW: 16.8 % — ABNORMAL HIGH (ref 11.5–15.5)
WBC: 9.4 10*3/uL (ref 4.0–10.5)

## 2013-08-12 LAB — BASIC METABOLIC PANEL
BUN: 39 mg/dL — AB (ref 6–23)
CALCIUM: 8.2 mg/dL — AB (ref 8.4–10.5)
CO2: 35 meq/L — AB (ref 19–32)
CREATININE: 1.24 mg/dL (ref 0.50–1.35)
Chloride: 100 mEq/L (ref 96–112)
GFR calc Af Amer: 62 mL/min — ABNORMAL LOW (ref >90)
GFR calc non Af Amer: 54 mL/min — ABNORMAL LOW (ref >90)
Glucose, Bld: 178 mg/dL — ABNORMAL HIGH (ref 70–99)
Potassium: 4.6 mEq/L (ref 3.7–5.3)
Sodium: 147 mEq/L (ref 137–147)

## 2013-08-12 LAB — PRO B NATRIURETIC PEPTIDE: PRO B NATRI PEPTIDE: 61891 pg/mL — AB (ref 0–450)

## 2013-08-12 LAB — POCT I-STAT TROPONIN I: Troponin i, poc: 0.04 ng/mL (ref 0.00–0.08)

## 2013-08-12 MED ORDER — CARVEDILOL 3.125 MG PO TABS
3.1250 mg | ORAL_TABLET | Freq: Two times a day (BID) | ORAL | Status: DC
  Administered 2013-08-13 (×2): 3.125 mg via ORAL
  Filled 2013-08-12 (×5): qty 1

## 2013-08-12 MED ORDER — DEXTROSE 5 % IV SOLN
1.0000 g | INTRAVENOUS | Status: DC
  Administered 2013-08-13 – 2013-08-15 (×3): 1 g via INTRAVENOUS
  Filled 2013-08-12 (×4): qty 1

## 2013-08-12 MED ORDER — SODIUM CHLORIDE 0.9 % IJ SOLN
3.0000 mL | Freq: Two times a day (BID) | INTRAMUSCULAR | Status: DC
  Administered 2013-08-12 – 2013-08-15 (×2): 3 mL via INTRAVENOUS

## 2013-08-12 MED ORDER — ONDANSETRON HCL 4 MG/2ML IJ SOLN: 4.0000 mg | Freq: Four times a day (QID) | INTRAMUSCULAR | Status: DC | PRN

## 2013-08-12 MED ORDER — FUROSEMIDE 10 MG/ML IJ SOLN
20.0000 mg | INTRAMUSCULAR | Status: AC
  Administered 2013-08-12: 20 mg via INTRAVENOUS

## 2013-08-12 MED ORDER — FUROSEMIDE 10 MG/ML IJ SOLN
40.0000 mg | Freq: Two times a day (BID) | INTRAMUSCULAR | Status: DC
  Administered 2013-08-13 – 2013-08-14 (×3): 40 mg via INTRAVENOUS
  Filled 2013-08-12 (×5): qty 4

## 2013-08-12 MED ORDER — ENOXAPARIN SODIUM 40 MG/0.4ML ~~LOC~~ SOLN
40.0000 mg | SUBCUTANEOUS | Status: DC
Start: 2013-08-12 — End: 2013-08-14
  Administered 2013-08-12 – 2013-08-13 (×2): 40 mg via SUBCUTANEOUS
  Filled 2013-08-12 (×4): qty 0.4

## 2013-08-12 MED ORDER — VANCOMYCIN HCL IN DEXTROSE 750-5 MG/150ML-% IV SOLN
750.0000 mg | INTRAVENOUS | Status: DC
  Filled 2013-08-12: qty 150

## 2013-08-12 MED ORDER — SODIUM CHLORIDE 0.9 % IJ SOLN
3.0000 mL | INTRAMUSCULAR | Status: DC | PRN
  Administered 2013-08-14: 11:00:00 3 mL via INTRAVENOUS

## 2013-08-12 MED ORDER — INSULIN ASPART 100 UNIT/ML ~~LOC~~ SOLN
0.0000 [IU] | Freq: Three times a day (TID) | SUBCUTANEOUS | Status: DC
  Administered 2013-08-13: 5 [IU] via SUBCUTANEOUS
  Administered 2013-08-13: 1 [IU] via SUBCUTANEOUS
  Administered 2013-08-13: 12:00:00 7 [IU] via SUBCUTANEOUS
  Administered 2013-08-14 – 2013-08-16 (×3): 1 [IU] via SUBCUTANEOUS
  Administered 2013-08-16 – 2013-08-17 (×2): 2 [IU] via SUBCUTANEOUS
  Administered 2013-08-17: 3 [IU] via SUBCUTANEOUS

## 2013-08-12 MED ORDER — LISINOPRIL 20 MG PO TABS
20.0000 mg | ORAL_TABLET | Freq: Every day | ORAL | Status: DC
  Administered 2013-08-12 – 2013-08-13 (×2): 20 mg via ORAL
  Filled 2013-08-12 (×3): qty 1

## 2013-08-12 MED ORDER — SODIUM CHLORIDE 0.9 % IV SOLN: 250.0000 mL | INTRAVENOUS | Status: DC | PRN

## 2013-08-12 MED ORDER — ACETAMINOPHEN 325 MG PO TABS: 650.0000 mg | ORAL_TABLET | ORAL | Status: DC | PRN

## 2013-08-12 MED ORDER — FUROSEMIDE 10 MG/ML IJ SOLN
INTRAMUSCULAR | Status: AC
  Filled 2013-08-12: qty 4

## 2013-08-12 MED ORDER — ENOXAPARIN SODIUM 30 MG/0.3ML ~~LOC~~ SOLN
30.0000 mg | SUBCUTANEOUS | Status: DC
  Filled 2013-08-12: qty 0.3

## 2013-08-12 MED ORDER — ASPIRIN EC 81 MG PO TBEC
81.0000 mg | DELAYED_RELEASE_TABLET | Freq: Every day | ORAL | Status: DC
  Administered 2013-08-13 – 2013-08-17 (×4): 81 mg via ORAL
  Filled 2013-08-12 (×5): qty 1

## 2013-08-12 MED ORDER — DEXTROSE 5 % IV SOLN
1.0000 g | Freq: Once | INTRAVENOUS | Status: AC
  Administered 2013-08-12: 1 g via INTRAVENOUS
  Filled 2013-08-12: qty 1

## 2013-08-12 MED ORDER — VANCOMYCIN HCL IN DEXTROSE 1-5 GM/200ML-% IV SOLN
1000.0000 mg | INTRAVENOUS | Status: AC
  Administered 2013-08-12: 22:00:00 1000 mg via INTRAVENOUS
  Filled 2013-08-12: qty 200

## 2013-08-12 MED ORDER — OXYCODONE HCL 5 MG PO TABS
5.0000 mg | ORAL_TABLET | ORAL | Status: DC | PRN
  Administered 2013-08-14 – 2013-08-16 (×6): 5 mg via ORAL
  Filled 2013-08-12 (×6): qty 1

## 2013-08-12 NOTE — ED Notes (Signed)
Notified RN,Johnson pt. CBG 196.

## 2013-08-12 NOTE — H&P (Addendum)
[facility] History and Physical  Frederick EndsRobert Hoskin EAV:[id number] DOB: 11/23/1931 DOA: 08/12/2013  Referring physician: Linwood DibblesJon Knapp, ER physician PCP: Arvella NighADCOCK, JIMMIE, MD   Chief Complaint: Low blood sugar  HPI: Frederick [name] is a 80 y.o. male  Past medical history of dysphagia, diabetes and a recent hospitalization for treatment of acute on chronic congestive heart failure who was just discharged from the hospitalist service 2 days ago and came in after a hypoglycemic event. Family, patient not been eating much lately and when was evaluated by home health, they gave him his home meds. Patient became unresponsive and CBG checked and found to be low. Reportedly at 40. EMS: Patient given some D50 and CBG improved to 61. At that time patient became more alert. He is brought into the emergency room for further evaluation. Labs in the emergency room were unremarkable except for a BNP of 61,000 (on prior admission, BNP was greater than 70,000) in her and chest x-ray denoting questionable new lower lobe infiltrate and versus edema. Hospitalists were called for further evaluation and admission.    Review of Systems:   Patient seen down emergency room. He says . He denies any chest pain or palpitations. Denies any shortness of breath, does complain of a nonproductive cough which he says is been going on for several weeks. He denies abdominal pain, diarrhea, hematuria. He says lately has not had much appetite. Denies any constipation or diarrhea, focal extremity numbness or weakness or pain. He does feel generally weak. His review systems otherwise negative.he is feeling better. He denies any headaches or vision changes  Past Medical History  Diagnosis Date  . Hypertension   . Diabetes mellitus   . Symptomatic bradycardia     s/p pacemaker  . CHF (congestive heart failure)   . Dysrhythmia   . Shortness of breath   . Pacemaker   . Anginal pain     PER PATIENT  . Pulmonary hypertension 04/15/2013  . HTN  (hypertension) 08/06/2013  . Protein-calorie malnutrition, severe 08/06/2013  . Ex-cigarette smoker 08/08/2013  dysphagia  Past Surgical History  Procedure Laterality Date  . Pacemaker insertion  06/16/2010    [facility] devise at [facility]  . Neck surgery    . Joint replacement      bilateral hip  . Cholecystectomy     Social History:  reports that he quit smoking about 41 years ago. His smoking use included Cigarettes. He smoked 0.00 packs per day. He has quit using smokeless tobacco. His smokeless tobacco use included Snuff and Chew. He reports that he does not drink alcohol or use illicit drugs. patient lives at home with family. During his last hospitalization was recommended he go to short-term skilled nursing, but he refused and instead opted for home with home health  No Known Allergies  Family History  Problem Relation Age of Onset  . Heart failure Mother      Prior to Admission medications   Medication Sig Start Date End Date Taking? Authorizing Provider  aspirin EC 81 MG EC tablet Take 1 tablet (81 mg total) by mouth daily. 04/21/13   Brittainy Simmons, PA-C  carvedilol (COREG) 3.125 MG tablet Take 1 tablet (3.125 mg total) by mouth 2 (two) times daily with a meal. 04/21/13   Brittainy Simmons, PA-C  furosemide (LASIX) 40 MG tablet Take 1 tablet (40 mg total) by mouth 2 (two) times daily. 08/10/13   Esperanza SheetsUlugbek N Buriev, MD  glipiZIDE (GLUCOTROL) 5 MG tablet Take 0.5 tablets (2.5 mg total)  by mouth daily before breakfast. 08/10/13   Esperanza Sheets, MD  lisinopril (PRINIVIL,ZESTRIL) 20 MG tablet Take 20 mg by mouth daily.     Historical Provider, MD  oxyCODONE (OXY IR/ROXICODONE) 5 MG immediate release tablet Take 1 tablet (5 mg total) by mouth every 4 (four) hours as needed for moderate pain. 08/10/13   Esperanza Sheets, MD   Physical Exam: Filed Vitals:   08/12/13 2100  BP: 111/41  Pulse: 70  Temp: 98 F (36.7 C)  Resp:     BP 111/41  Pulse 70  Temp(Src) 98 F (36.7 C) (Oral)   Resp 22  Ht 5' 6.14" (1.68 m)  Wt 57.2 kg (126 lb 1.7 oz)  BMI 20.27 kg/m2  SpO2 98%  General:  alert and oriented x2, no acute distress: Mild emaciated HEENT: Normocephalic, atraumatic, mucous her meds are slightly dry Cardiovascular: Regular rate and rhythm with occasional ectopic beats Lungs: Decreased breath sounds bibasilar, much upper respiratory noise Abdomen: Soft, nontender, nondistended, positive bowel sounds Extremities: No clubbing or cyanosis, trace pitting edema Psych: Patient is appropriate, no evidence of psychoses           Labs on Admission:  Basic Metabolic Panel:  Recent Labs Lab 08/06/13 0510 08/06/13 1121 08/07/13 0500 08/08/13 0400 08/09/13 0413 08/10/13 0350 08/12/13 1649  NA 144  --  146 146 151* 149* 147  K 4.9  --  4.2 3.4* 3.3* 3.9 4.6  CL 106  --  109 105 106 104 100  CO2 26  --  25 33* 38* 38* 35*  GLUCOSE 292*  --  118* 253* 222* 151* 178*  BUN 77*  --  79* 75* 59* 46* 39*  CREATININE 2.05*  --  1.88* 1.72* 1.35 1.20 1.24  CALCIUM 8.1*  --  7.9* 7.8* 7.6* 7.8* 8.2*  MG  --  2.6*  --   --   --   --   --   PHOS  --  5.3*  --   --   --   --   --    Liver Function Tests:  Recent Labs Lab 08/06/13 1121  AST 15  ALT 28  ALKPHOS 102  BILITOT 0.7  PROT 5.9*  ALBUMIN 2.6*   No results found for this basename: LIPASE, AMYLASE,  in the last 168 hours No results found for this basename: AMMONIA,  in the last 168 hours CBC:  Recent Labs Lab 08/06/13 0510 08/07/13 0500 08/08/13 0400 08/09/13 0413 08/10/13 0350 08/12/13 1800  WBC 11.0* 13.9* 10.6* 9.2 9.2 9.4  NEUTROABS 9.8*  --   --   --   --  8.2*  HGB 13.2 12.5* 11.5* 10.6* 11.5* 13.8  HCT 41.1 38.5* 36.0* 34.4* 36.5* 45.3  MCV 84.6 84.2 84.9 87.8 86.5 88.1  PLT 81* 69* 72* 76* 75* 70*   Cardiac Enzymes:  Recent Labs Lab 08/06/13 0533 08/06/13 1121 08/06/13 1445 08/06/13 2220  TROPONINI <0.30 <0.30 <0.30 <0.30    BNP (last 3 results)  Recent Labs  04/18/13 0555  08/06/13 1121 08/12/13 1649  PROBNP 6223.0* >70000.0* 61891.0*   CBG:  Recent Labs Lab 08/12/13 1427 08/12/13 1547 08/12/13 1727 08/12/13 1900 08/12/13 1957  GLUCAP 116* 153* 181* 181* 196*    Radiological Exams on Admission: Dg Chest Portable 1 View  08/12/2013   CLINICAL DATA:  Hypotension with hypoglycemia and shortness of breath.  EXAM: PORTABLE CHEST - 1 VIEW  COMPARISON:  04/13/2013 and 03/10/2013.  FINDINGS: 1903 hr. Lordotic  positioning. The left subclavian pacemaker leads appear unchanged within the right atrium and right ventricle. The heart size and mediastinal contours are stable. There is aortic atherosclerosis. There is new retrocardiac opacity with a probable adjacent left pleural effusion. The right lung is clear. There is no pneumothorax.  IMPRESSION: New left lower lobe airspace disease and adjacent pleural effusion suspicious for pneumonia. Radiographic follow up recommended.   Electronically Signed   By: Roxy Horseman M.D.   On: 08/12/2013 19:18    EKG: Independently reviewed.  regular rate and rhythm, U. PACs and PVCs, unchanged from previous   Assessment/Plan Active Problems:   HTN (hypertension): Stable continue ACE inhibitor and beta blocker    DM (diabetes mellitus): Likely hyperglycemia brought on by underlying infection versus CHF which led to poor appetite. Holding meds and sliding scale only    COPD (chronic obstructive pulmonary disease)   Protein-calorie malnutrition, severe: Patient meets criteria given chronic illness   HCAP (healthcare-associated pneumonia): We'll treat as healthcare associated given recent hospitalization, however I suspect very much that this may be more aspiration pneumonia. When I asked patient if I can get him anything, he asked for water but prior to taking it, asked if there was any thickener in it, stating that he did not want that. I suspect he is been noncompliant with his dysphagia diet at home. Checking pro calcitonin level  to help distinguish if any of this is indeed true pneumonia    Hypoglycemia: See above   Dysphagia: See above. Restart dysphagia 2 diet with thickened liquids Acute on chronic systolic/diastolic heart failure: Despite diuresis from earlier, suspect he likely still has some continued significant volume overload. Started IV Lasix. Renal function stable. Monitor strict input and output closely. Last echocardiogram done 1 week ago noting 15-20% ejection fraction plus grade 1 diastolic dysfunction  Code Status: DNR  Family Communication: No family present, had already gone home. Left message.  Disposition Plan: Likely here for several days  Time spent: 40 minutes  Hollice Espy [facility] Pager [phone]

## 2013-08-12 NOTE — Progress Notes (Signed)
[facility]  Patient Status: Active (receiving services up to time of hospitalization)  [facility] is providing the following services: RN, PT, OT, MSW and HHA - PLEASE NOTE: D/Ced from [facility] 2 days ago, has been seen by our nurse and PT. PT sent the pt back to the hospital today. Pt is not being cared for at home. We will not be able to take this patient back for home care.  Unsafe home environment. Please call me if you need details. Jodene Nam, RN [phone]    Jodene Nam 08/12/2013, 2:30 PM

## 2013-08-12 NOTE — Progress Notes (Signed)
CARE MANAGEMENT [place] NOTE 08/12/2013  Patient:  Frederick [name], Frederick [name]   Account Number:  [id number]  Date Initiated:  08/12/2013  Documentation initiated by:  Frederick [name]  Subjective/Objective Assessment:   80 yr old a[employer] medicare complete pt [facility] Pt during last hospitalization pt refused facility placement Pt confirmed his home address & number as correct in EPIC Pt states pcp is in [place]     Subjective/Objective Assessment Detail:   [place] as Dr Arvella Nigh, Can not recall name of the office or street Dr office on. Reports he has not seen pcp "in awhile" CM found this pcp [phone]  listed in scanned insurance card in EPIC.  Pt gives CM permission to speak with Frederick [name], [name], about Dr, visiting MDs and pharmacies that deliver medicines States his son Frederick [name] lives in [place] States he & his [name] plan to move to [place] soon & agrees to a list of in network doctors in [place] When CM inquired if pt had thought about moving near his son He stated "That will be up to my [name]" Pt visited by EMS staff during Cm visit Low cbg at home 40 in [place] increased to 116 bp 94/49 c/o low cbg, hypotension BNP 42683.4     Action/Plan:   Cm spoke with Frederick [name], Frederick [name] Frederick [name], Frederick [name], Frederick [name], Frederick [name], Frederick [name] Cm made attempt to call ppc office to verify if pt active for services  but no answer at 1655   Action/Plan Detail:   CM provided pt with contact information: Frederick [name] Visits [address], [address] [place], [place] [address] Dr Frederick [name] or Frederick [name] to Frederick [name]  Spoke with Frederick [name], Frederick [name]   Anticipated DC Date:       Status Recommendation to Physician:   Result of Recommendation:    Other [place] Services  Consult Working Plan   In-house referral  Clinical Social Worker   DC Associate Professor  Other  PCP issues  Other   Frederick [name]  HOME HEALTH    [name] offered to / List presented to:  C-1 Patient     HH arranged  HH-1 [name]  HH-2 PT  HH-3 OT  HH-4 NURSE'S AIDE  HH-6 SOCIAL WORKER       Status of service:  Completed, signed off  Frederick Comments:  Cm dialed the home number [phone] but automated voice message reports it is not in service & when [name] informed the pt he is not able to provide another number to reach [name] nor son in [place]  Frederick Comments Detail:  Information offered to pt: Pharmacy that delivers depending on location [name] [address] [place], [place] [address] Phone: [phone] Fax: [phone] [name] [address], [place] [address] [phone] fax [phone] M-F 9a-7p Sat 10a-3 pm Sunday closed Informed pt of discussion with [place] home care staff and that they would no longer be willing to provide further care for him Provided pt with a list of private duty nursing services CM reviewed in details medicare guidelines, home health [name]) (length of stay in home, types of [name] staff available, coverage, primary caregiver, up to 24 hrs before services may be started) and Private duty nursing (PDN-coverage, length of stay in the home types of staff available), CM reviewed availability of HH [name] to assist pcp to get pt  to snf (if desired disposition) from the community level. CM provided pt with a list of [place] home health agencies and  PDN  pt given a list of in network pcps for  zip code [phone] & [place] [phone] All resources placed in pt belonging bag near pt's bedside

## 2013-08-12 NOTE — ED Notes (Signed)
Pt is awake and alert x 3.  Denies pain.  8 oz Orange juice and peanut butter given as pt is awake and able to control his own airway and tolerate PO's

## 2013-08-12 NOTE — ED Notes (Signed)
Bed: WA23 Expected date:  Expected time:  Means of arrival:  Comments: ems 

## 2013-08-12 NOTE — Progress Notes (Signed)
CSW called Martha Clan, of [facility]. Per Ms. Hickling patient does not have a pcp, had a previous referral for home health but patient could not be followed up with due to phone numbers being disconnected and not answering the door. Per [facility] at this time, patient is not able to return home with home health services through [facility].   Per Ms. Hickling patient doe snot have a any follow up for cardiac care, as patient has not be accepted as a patient at any cardiac specialist. Per [facility], physical therapy does not feel patient wife can assist with patient care appropriately. Physical therapy assisted with cleaning patient up after being found in feces. Patient cbgs were 40. Per RN, patient home environment was inappropriate, and that patient was in feces and urine and house had inappropriate food. Per ems, no food in the house.   Per chart review, patient and patient family adamantly refused skilled nursing placement even thought patient was recommended for skilled nursing facility placement.     CSW awaiting further medical evaluation to determine pt disposition needs.   Catha Gosselin, LCSW [phone]  ED CSW .08/12/2013 1509   CSW spoke with EDP, who states patient has capacity is alert and oriented x4. Patient requesting to return home when medically stable. CSW discussed with RN CM.   Marland KitchenCatha Gosselin, [place] [phone]  ED CSW .08/12/2013 1526pm

## 2013-08-12 NOTE — ED Provider Notes (Signed)
CSN: [id number]     Arrival date & time 08/12/13  1348 History   First MD Initiated Contact with Patient 08/12/13 1503     Chief Complaint  Patient presents with  . Hypoglycemia  . Hypotension    HPI Pt has not been eating as much lately.  Home health checked on him and gave him his home meds.  Pt had not eaten much before giving him his medications.  Family  Was concerned because all medications were given at once.  They also had tried to tell the home health that he had not eaten anything.  Pt started to become unresponsive.  CBG was checked and it was low.  No treatment was given until EMS arrived.  They found that his CBG was 40.  EMS gave 250ml D10 and 15ml of D50 and on recheck it was 61 and reports pt was much more alert.  Pt denies any complaints at this time.   He does remember stating to feel weak at home.  He denies chest pain or abdominal pain.  No vomiting or diarrhea.  No dyspnea.  Per recent hospitalization notes Hospital Course:   D/C Jan 6th 1. Acute on chronic CHF; systolic HF; echo (08/06/13):LVEF 20%, dilated cardiomyopathy  -04/19/13 LHC by Dr. Tresa EndoKelly normal coronaries and severely reduced systolic function, suggesting he has nonischemic cardiomyopathy.  -improved on IV diuresis; I/O neg 7.0 L; changed to PO diuretics per cards; HHC/HF upon d/c  2. Influenza A; cont tamiflu; repeat CXT; afebrile;  3. COPD chronic; cont inhalers, repeat WUJ:[id number]:possible consolidation; add atx on 1/4  4. CKD II close monitor OP;  5. Anemia thrombocytopenia of unclear etiology; no s/s of acute bleeding;  6. DM : ha1c-8.9; started PO meds; OP f/u  Patient has declined, and PT recommended SNF; but family, patient refused, wants to go home; will arrange HHC/HF follow up  -unfortunately due to worsening HF/ESHD high risk for decline in near future    Past Medical History  Diagnosis Date  . Hypertension   . Diabetes mellitus   . Symptomatic bradycardia     s/p pacemaker  . CHF (congestive heart  failure)   . Dysrhythmia   . Shortness of breath   . Pacemaker   . Anginal pain     PER PATIENT  . Pulmonary hypertension 04/15/2013  . HTN (hypertension) 08/06/2013  . Protein-calorie malnutrition, severe 08/06/2013  . Ex-cigarette smoker 08/08/2013   Past Surgical History  Procedure Laterality Date  . Pacemaker insertion  06/16/2010    Biotronik devise at [facility]  . Neck surgery    . Joint replacement      bilateral hip  . Cholecystectomy     Family History  Problem Relation Age of Onset  . Heart failure Mother    History  Substance Use Topics  . Smoking status: Former Smoker    Types: Cigarettes    Quit date: 08/08/1972  . Smokeless tobacco: Not on file  . Alcohol Use: No    Review of Systems  All other systems reviewed and are negative.    Allergies  Review of patient's allergies indicates no known allergies.  Home Medications   Current Outpatient Rx  Name  Route  Sig  Dispense  Refill  . aspirin EC 81 MG EC tablet   Oral   Take 1 tablet (81 mg total) by mouth daily.         Marland Kitchen. aspirin EC 81 MG EC tablet   Oral   Take  1 tablet (81 mg total) by mouth daily.         . carvedilol (COREG) 3.125 MG tablet   Oral   Take 1 tablet (3.125 mg total) by mouth 2 (two) times daily with a meal.   60 tablet   5   . carvedilol (COREG) 3.125 MG tablet   Oral   Take 3.125 mg by mouth 2 (two) times daily with a meal.         . furosemide (LASIX) 20 MG tablet   Oral   Take 1 tablet (20 mg total) by mouth daily.   30 tablet   5   . furosemide (LASIX) 40 MG tablet   Oral   Take 1 tablet (40 mg total) by mouth 2 (two) times daily.   60 tablet   4   . glipiZIDE (GLUCOTROL) 5 MG tablet   Oral   Take 0.5 tablets (2.5 mg total) by mouth daily before breakfast.   30 tablet   2   . HYDROcodone-acetaminophen (NORCO) 5-325 MG per tablet   Oral   Take 1 tablet by mouth every 6 (six) hours as needed for pain.   10 tablet   0   . levofloxacin (LEVAQUIN) 500 MG  tablet   Oral   Take 1 tablet (500 mg total) by mouth daily.   4 tablet   0   . lisinopril (PRINIVIL,ZESTRIL) 10 MG tablet   Oral   Take 10 mg by mouth daily.         Marland Kitchen lisinopril (PRINIVIL,ZESTRIL) 20 MG tablet   Oral   Take 20 mg by mouth daily.          Marland Kitchen oseltamivir (TAMIFLU) 30 MG capsule   Oral   Take 1 capsule (30 mg total) by mouth 2 (two) times daily.   4 capsule   0   . oxyCODONE (OXY IR/ROXICODONE) 5 MG immediate release tablet   Oral   Take 1 tablet (5 mg total) by mouth every 4 (four) hours as needed for moderate pain.   30 tablet   0    BP 110/47  Pulse 75  Temp(Src) 97.8 F (36.6 C) (Oral)  Resp 20  SpO2 88% Physical Exam  Nursing note and vitals reviewed. Constitutional: No distress.  Frail elderly   HENT:  Head: Normocephalic and atraumatic.  Right Ear: External ear normal.  Left Ear: External ear normal.  Mouth/Throat: No oropharyngeal exudate.  Eyes: Conjunctivae are normal. Right eye exhibits no discharge. Left eye exhibits no discharge. No scleral icterus.  Neck: Neck supple. No tracheal deviation present.  Cardiovascular: Normal rate, regular rhythm and intact distal pulses.   Pulmonary/Chest: Effort normal and breath sounds normal. No stridor. No respiratory distress. He has no wheezes. He has no rales.  Abdominal: Soft. Bowel sounds are normal. He exhibits no distension and no mass. There is no tenderness. There is no rebound and no guarding.  Musculoskeletal: He exhibits no edema and no tenderness.  Neurological: He is alert. No sensory deficit. Cranial nerve deficit: no facial froop, no slurred speech, EOMI. He exhibits normal muscle tone. He displays no seizure activity. Coordination normal.  General weakness although moves all extremities equally  Skin: Skin is warm and dry. No rash noted. He is not diaphoretic.  Psychiatric: He has a normal mood and affect.    ED Course  Procedures (including critical care time)   Labs  Review Labs Reviewed  GLUCOSE, CAPILLARY - Abnormal; Notable for the following:  Glucose-Capillary <10 (*)    All other components within normal limits  GLUCOSE, CAPILLARY - Abnormal; Notable for the following:    Glucose-Capillary 116 (*)    All other components within normal limits  GLUCOSE, CAPILLARY - Abnormal; Notable for the following:    Glucose-Capillary 153 (*)    All other components within normal limits  PRO B NATRIURETIC PEPTIDE - Abnormal; Notable for the following:    Pro B Natriuretic peptide (BNP) 20947.0 (*)    All other components within normal limits  BASIC METABOLIC PANEL - Abnormal; Notable for the following:    CO2 35 (*)    Glucose, Bld 178 (*)    BUN 39 (*)    Calcium 8.2 (*)    GFR calc non Af Amer 54 (*)    GFR calc Af Amer 62 (*)    All other components within normal limits  GLUCOSE, CAPILLARY - Abnormal; Notable for the following:    Glucose-Capillary 181 (*)    All other components within normal limits  CBC WITH DIFFERENTIAL - Abnormal; Notable for the following:    RDW 16.8 (*)    Platelets 70 (*)    Neutrophils Relative % 87 (*)    Neutro Abs 8.2 (*)    Lymphocytes Relative 6 (*)    Lymphs Abs 0.6 (*)    All other components within normal limits  GLUCOSE, CAPILLARY - Abnormal; Notable for the following:    Glucose-Capillary 181 (*)    All other components within normal limits  CBC WITH DIFFERENTIAL  CBC WITH DIFFERENTIAL  CBC  DIFFERENTIAL  POCT I-STAT TROPONIN I   Imaging Review Dg Chest Portable 1 View  08/12/2013   CLINICAL DATA:  Hypotension with hypoglycemia and shortness of breath.  EXAM: PORTABLE CHEST - 1 VIEW  COMPARISON:  04/13/2013 and 03/10/2013.  FINDINGS: 1903 hr. Lordotic positioning. The left subclavian pacemaker leads appear unchanged within the right atrium and right ventricle. The heart size and mediastinal contours are stable. There is aortic atherosclerosis. There is new retrocardiac opacity with a probable adjacent left  pleural effusion. The right lung is clear. There is no pneumothorax.  IMPRESSION: New left lower lobe airspace disease and adjacent pleural effusion suspicious for pneumonia. Radiographic follow up recommended.   Electronically Signed   By: Roxy Horseman M.D.   On: 08/12/2013 19:18    EKG Interpretation    Date/Time:  Thursday August 12 2013 14:12:09 EST Ventricular Rate:  69 PR Interval:  135 QRS Duration: 123 QT Interval:  451 QTC Calculation: 483 R Axis:   -118 Text Interpretation:  Sinus rhythm Multiple premature complexes, vent  Nonspecific IVCD with LAD Anteroseptal infarct, old Baseline wander in lead(s) II III aVF No significant change since last tracing except aritfact  Confirmed by Aleya Durnell  MD-J, Vincie Linn ([id number]) on 08/12/2013 3:23:17 PM           Medications  ceFEPIme (MAXIPIME) 1 g in dextrose 5 % 50 mL IVPB (not administered)  vancomycin per pharmacy doseing   Patient's blood sugars stabilize while in the emergency department.   1915  patient does remain alert and awake. He does appear very frail MDM   1. HCAP (healthcare-associated pneumonia)   2. Hypoglycemia   3. CHF (congestive heart failure)    The patient appears in a new airspace opacity suggesting pneumonia. Patient was recently in the hospital. I will start him on IV antibiotics to cover for healthcare associated pneumonia.  Case management was involved in evaluated the  patient. There is some concerning home health situations. I will consult the medical service regarding admission for further treatment    Celene Kras, MD 08/12/13 1929

## 2013-08-12 NOTE — ED Notes (Addendum)
Per EMS:  Pt lives with son and wife.  EMS reports the son argued about pt being brought into the hospital.  EMS reports there is no food at the home and that pt hasn't eaten in days.  Also per EMS, it appears the pt and wife don't appear capable of driving and getting to grocery store.  [facility] staff was at the house but advised EMS she wasn't able to give pt anything/ do anything.   CBG by EMS was 40 and pt was lethargic on scene.  EMS gave D10 and 2ml of D50 and on recheck it was 61 and reports pt was much more alert.  118/68, 80 SR w/PVC's, 20, 97NRB.  EMS reports the son's name is Leonette Most and the pt and wife  need social worker consult.  IV placed by EMS was d/c by accident when EMS staff brought pt into the room

## 2013-08-12 NOTE — Progress Notes (Signed)
ANTIBIOTIC CONSULT NOTE - INITIAL  Pharmacy Consult for Vancomycin Indication: HCAP  No Known Allergies  Patient Measurements:   Adjusted Body Weight:   Vital Signs: Temp: 97.8 F (36.6 C) (01/08 1807) Temp src: Oral (01/08 1807) BP: 110/47 mmHg (01/08 1807) Pulse Rate: 75 (01/08 1807) Intake/Output from previous day:   Intake/Output from this shift:    Labs:  Recent Labs  08/10/13 0350 08/12/13 1649 08/12/13 1800  WBC 9.2  --  9.4  HGB 11.5*  --  13.8  PLT 75*  --  70*  CREATININE 1.20 1.24  --    The CrCl is unknown because both a height and weight (above a minimum accepted value) are required for this calculation. No results found for this basename: VANCOTROUGH, Leodis Binet, VANCORANDOM, GENTTROUGH, GENTPEAK, GENTRANDOM, TOBRATROUGH, TOBRAPEAK, TOBRARND, AMIKACINPEAK, AMIKACINTROU, AMIKACIN,  in the last 72 hours   Microbiology: Recent Results (from the past 720 hour(s))  URINE CULTURE     Status: None   Collection Time    08/06/13  1:32 PM      Result Value Range Status   Specimen Description URINE, CATHETERIZED   Final   Special Requests NONE   Final   Culture  Setup Time     Final   Value: 08/06/2013 14:27     Performed at [facility] Count     Final   Value: NO GROWTH     Performed at [facility]   Culture     Final   Value: NO GROWTH     Performed at [facility]   Report Status 08/07/2013 FINAL   Final    Medical History: Past Medical History  Diagnosis Date  . Hypertension   . Diabetes mellitus   . Symptomatic bradycardia     s/p pacemaker  . CHF (congestive heart failure)   . Dysrhythmia   . Shortness of breath   . Pacemaker   . Anginal pain     PER PATIENT  . Pulmonary hypertension 04/15/2013  . HTN (hypertension) 08/06/2013  . Protein-calorie malnutrition, severe 08/06/2013  . Ex-cigarette smoker 08/08/2013   Assessment: 51 yoM with recent hospitalization for AoCHF and influenza now admitted to [facility]  1/8 with HCAP and hypoglycemia.  Pharmacy consulted to dose vancomycin.    D1 Antibiotics 1/8 >> Vancomycin >> 1/8 >> Cefepime x1  Tm24h: 98.2 Renal: CKD-II: SCr 1.24, CrCl 39 (N48) WBC 9.4  Micro: None  Goal of Therapy:  Vancomycin trough level 15-20 mcg/ml Eradication of infection  Plan:  1.  Vancomycin 1000mg  IV x 1, then 750mg  IV q 24 h. 2.  F/u admitting MD resuming cefepime or other appropriate agent.     Nicolina Hirt E 08/12/2013,7:38 PM

## 2013-08-13 DIAGNOSIS — N179 Acute kidney failure, unspecified: Secondary | ICD-10-CM

## 2013-08-13 DIAGNOSIS — I5043 Acute on chronic combined systolic (congestive) and diastolic (congestive) heart failure: Secondary | ICD-10-CM

## 2013-08-13 LAB — LEGIONELLA ANTIGEN, URINE: Legionella Antigen, Urine: NEGATIVE

## 2013-08-13 LAB — STREP PNEUMONIAE URINARY ANTIGEN: STREP PNEUMO URINARY ANTIGEN: NEGATIVE

## 2013-08-13 LAB — BASIC METABOLIC PANEL
BUN: 43 mg/dL — AB (ref 6–23)
CALCIUM: 7.6 mg/dL — AB (ref 8.4–10.5)
CO2: 38 mEq/L — ABNORMAL HIGH (ref 19–32)
Chloride: 93 mEq/L — ABNORMAL LOW (ref 96–112)
Creatinine, Ser: 1.19 mg/dL (ref 0.50–1.35)
GFR calc Af Amer: 65 mL/min — ABNORMAL LOW (ref >90)
GFR, EST NON AFRICAN AMERICAN: 56 mL/min — AB (ref >90)
Glucose, Bld: 328 mg/dL — ABNORMAL HIGH (ref 70–99)
POTASSIUM: 3.8 meq/L (ref 3.7–5.3)
Sodium: 139 mEq/L (ref 137–147)

## 2013-08-13 LAB — HEMOGLOBIN A1C
HEMOGLOBIN A1C: 9.2 % — AB (ref <5.7)
MEAN PLASMA GLUCOSE: 217 mg/dL — AB (ref <117)

## 2013-08-13 LAB — GLUCOSE, CAPILLARY
Glucose-Capillary: 269 mg/dL — ABNORMAL HIGH (ref 70–99)
Glucose-Capillary: 316 mg/dL — ABNORMAL HIGH (ref 70–99)
Glucose-Capillary: 148 mg/dL — ABNORMAL HIGH (ref 70–99)
Glucose-Capillary: 181 mg/dL — ABNORMAL HIGH (ref 70–99)

## 2013-08-13 LAB — PROLACTIN: PROLACTIN: 6 ng/mL (ref 2.1–17.1)

## 2013-08-13 MED ORDER — ENSURE COMPLETE PO LIQD
237.0000 mL | Freq: Three times a day (TID) | ORAL | Status: DC
  Administered 2013-08-13 – 2013-08-16 (×7): 237 mL via ORAL

## 2013-08-13 MED ORDER — INSULIN GLARGINE 100 UNIT/ML ~~LOC~~ SOLN
15.0000 [IU] | Freq: Every day | SUBCUTANEOUS | Status: DC
  Administered 2013-08-13 – 2013-08-14 (×2): 15 [IU] via SUBCUTANEOUS
  Filled 2013-08-13 (×3): qty 0.15

## 2013-08-13 MED ORDER — VANCOMYCIN HCL 500 MG IV SOLR
500.0000 mg | Freq: Two times a day (BID) | INTRAVENOUS | Status: DC
  Administered 2013-08-13 – 2013-08-17 (×8): 500 mg via INTRAVENOUS
  Filled 2013-08-13 (×9): qty 500

## 2013-08-13 NOTE — Progress Notes (Signed)
ANTIBIOTIC CONSULT NOTE - Follow Up  30 yoM with recent hospitalization for AoCHF and influenza now admitted to [facility] 1/8 with HCAP and hypoglycemia. Pharmacy consulted to dose vancomycin.   1/8 >> Vanc 1g x1, then 750mg  q24h >> 1/8 >> Cefepime 1g q24h >>  Tm24h: AF Renal: CKD-II: SCr down 1.19, CrCl 41 ml/min (N50) WBC: 9.4  Sputum cx ordered, not yet collected  Plan:  Since SCr improved, will adjust vancomycin dose to 500mg  IV q12h. Check trough at steady state Continue cefepime 1g IV q24h  Follow up renal function & cultures, de-escalation of therapy  Loralee Pacas, PharmD, BCPS Pager: [phone] 08/13/2013 11:28 AM

## 2013-08-13 NOTE — Progress Notes (Signed)
FYI--Hospitalist, please call pt's wife at [phone] "Jassiem Tambe". THX

## 2013-08-13 NOTE — Progress Notes (Addendum)
Patient ID: Frederick [name], male   DOB: 03/11/1932, 80 y.o.   MRN: [id number]  [facility] PROGRESS NOTE  Frederick [name] ZYS:[id number] DOB: 31-May-1932 DOA: 08/12/2013 PCP: Arvella Nigh, MD  Brief narrative: 80 y.o. male with history of dysphagia, diabetes and a recent hospitalization for treatment of acute on chronic congestive heart failure who was just discharged from the hospitalist service 2 days ago and came in after a hypoglycemic event, reportedly at 40. EMS: Patient given some D50 and CBG improved to 61. At that time patient became more alert. He was brought into the emergency room for further evaluation. Labs in the emergency room were unremarkable except for a BNP of 61,000 (on prior admission, BNP was greater than 70,000) and chest x-ray denoting questionable new lower lobe infiltrate versus edema. Hospitalists were called for further evaluation and admission.   Active Problems:   Acute respiratory failure - secondary to HCAP with ? Pulmonary vascular congestion - pt started on broad spectrum ABX and has responded well - continue Vancomycin and Maxipime day #2 - continue Lasix as per home medical regimen - provide oxygen via [place]    HTN (hypertension) - reasonable inpatient control  - continue home medical regimen    DM (diabetes mellitus) - continue SSI for now and Lantus - perhaps pt would do better off insulin at home especially if oral intake inadequate    COPD (chronic obstructive pulmonary disease) - clinically compensated, no wheezing on exam - continue ABX as noted above and provide oxygen    Protein-calorie malnutrition, severe - secondary to progressive FTT and deconditioning  - encourage PO intake as pt able to tolerate    HCAP (healthcare-associated pneumonia) - management with broad spectrum ABX as noted above    Hypoglycemia - A1C is 9.2, pt still with poor oral intake - perhaps pt can be better off on Metformin rather then Glipizide or Insulin upon discharge -  will discuss with family    Dysphagia - SLP evaluation   Consultants:  None  Procedures/Studies: Dg Chest Portable 1 View   08/12/2013  New left lower lobe airspace disease and adjacent pleural effusion suspicious for pneumonia.  Antibiotics:  Vancomycin 01/09 -->  Maxipime 01/09 -->  Code Status: DNR Family Communication: Pt at bedside Disposition Plan: To be determined  HPI/Subjective: No events overnight.   Objective: Filed Vitals:   08/12/13 2041 08/12/13 2100 08/13/13 0545 08/13/13 1045  BP:  111/41 118/55 120/64  Pulse:  70 61   Temp: 97.9 F (36.6 C) 98 F (36.7 C) 98.1 F (36.7 C)   TempSrc: Oral Oral Axillary   Resp:   20   Height:  5\' 6"  (1.676 m)    Weight:  58.1 kg (128 lb 1.4 oz) 58.1 kg (128 lb 1.4 oz)   SpO2:  98% 99%     Intake/Output Summary (Last 24 hours) at 08/13/13 1123 Last data filed at 08/13/13 9323  Gross per 24 hour  Intake    718 ml  Output    500 ml  Net    218 ml    Exam:   General:  Pt is alert, follows commands appropriately, not in acute distress  Cardiovascular: Regular rate and rhythm, S1/S2, no murmurs, no rubs, no gallops  Respiratory: Rhonchi bilaterally and diminished breath sounds at bases   Abdomen: Soft, non tender, non distended, bowel sounds present, no guarding  Extremities: No edema, pulses DP and PT palpable bilaterally  Data Reviewed: Basic Metabolic Panel:  Recent Labs  Lab 08/08/13 0400 08/09/13 0413 08/10/13 0350 08/12/13 1649 08/13/13 0749  NA 146 151* 149* 147 139  K 3.4* 3.3* 3.9 4.6 3.8  CL 105 106 104 100 93*  CO2 33* 38* 38* 35* 38*  GLUCOSE 253* 222* 151* 178* 328*  BUN 75* 59* 46* 39* 43*  CREATININE 1.72* 1.35 1.20 1.24 1.19  CALCIUM 7.8* 7.6* 7.8* 8.2* 7.6*   CBC:  Recent Labs Lab 08/07/13 0500 08/08/13 0400 08/09/13 0413 08/10/13 0350 08/12/13 1800  WBC 13.9* 10.6* 9.2 9.2 9.4  NEUTROABS  --   --   --   --  8.2*  HGB 12.5* 11.5* 10.6* 11.5* 13.8  HCT 38.5* 36.0* 34.4*  36.5* 45.3  MCV 84.2 84.9 87.8 86.5 88.1  PLT 69* 72* 76* 75* 70*   Cardiac Enzymes:  Recent Labs Lab 08/06/13 1445 08/06/13 2220  TROPONINI <0.30 <0.30   CBG:  Recent Labs Lab 08/12/13 1547 08/12/13 1727 08/12/13 1900 08/12/13 1957 08/13/13 0747  GLUCAP 153* 181* 181* 196* 269*    Recent Results (from the past 240 hour(s))  URINE CULTURE     Status: None   Collection Time    08/06/13  1:32 PM      Result Value Range Status   Specimen Description URINE, CATHETERIZED   Final   Special Requests NONE   Final   Culture  Setup Time     Final   Value: 08/06/2013 14:27     Performed at [facility]   [place] Count     Final   Value: NO GROWTH     Performed at [facility]   Culture     Final   Value: NO GROWTH     Performed at [facility]   Report Status 08/07/2013 FINAL   Final     Scheduled Meds: . aspirin EC  81 mg Oral Daily  . carvedilol  3.125 mg Oral BID WC  . ceFEPime (MAXIPIME) IV  1 g Intravenous Q24H  . enoxaparin injection  40 mg Subcutaneous Q24H  . furosemide  40 mg Intravenous Q12H  . insulin aspart  0-9 Units Subcutaneous TID WC  . lisinopril  20 mg Oral Daily  . vancomycin  750 mg Intravenous Q24H   Continuous Infusions:  Debbora PrestoMAGICK-Ellsworth Waldschmidt, MD  [facility] Pager [phone]  If 7PM-7AM, please contact night-coverage [email or web address] Password [email or web address] 08/13/2013, 11:23 AM   LOS: 1 day

## 2013-08-13 NOTE — Progress Notes (Addendum)
Pt was active with [facility]. A call to [facility] revealed that they could not take the pt back related to pt's living conditions,  being unsafe. Will continue to search for new [facility].

## 2013-08-13 NOTE — Progress Notes (Signed)
INITIAL NUTRITION ASSESSMENT  Pt meets criteria for severe MALNUTRITION in the context of social/environmental circumstances as evidenced by < 50% estimated energy intake for the past month in addition to pt with severe muscle wasting and subcutaneous fat loss in upper arms and hands.  DOCUMENTATION CODES Per approved criteria  -Severe  malnutrition in the context of social or environmental circumstances   INTERVENTION: - Ensure Complete TID - RN to thicken to appropriate consistency - Encouraged pt to eat well during admission - Will continue to monitor   NUTRITION DIAGNOSIS: Increased nutrient needs related to severe malnutrition in the context of social/environmental circumstances as evidenced by pt report/nutrition focused physical exam.    Goal: Pt to consume >90% of meals/supplements  Monitor:  Weights, labs, intake  Reason for Assessment: Malnutrition screening tool   80 y.o. male  Admitting Dx: Low blood sugar   ASSESSMENT: Pt with history of dysphagia, diabetes and a recent hospitalization for treatment of acute on chronic congestive heart failure who was just discharged 2 days ago and came in after a hypoglycemic event. Family, patient not been eating much lately and when was evaluated by home health, they gave him his home meds. Patient became unresponsive and CBG checked and found to be low.   Met with pt who appeared tired and confused. States he hasn't been eating anything at all since Christmas but reports drinking 2 Ensure/day. States he has lost a lot of weight recently. Ate 100% of breakfast this morning. Ate 25-50% of meals last admission. States he was following dysphagia 2/nectar thick diet at home. Reports he had adequate food at home however noted EMS reported on admission there was no food at home and pt had not eaten in days.   Nutrition Focused Physical Exam:  Subcutaneous Fat:  Orbital Region: mild/moderate wasting Upper Arm Region: severe  wasting Thoracic and Lumbar Region: mild/moderate wasting  Muscle:  Temple Region: mild/moderate wasting Clavicle Bone Region: mild/moderate wasting Clavicle and Acromion Bone Region: mild/moderate wasting Scapular Bone Region: NA Dorsal Hand: severe wasting Patellar Region: NA Anterior Thigh Region: NA Posterior Calf Region: NA  Edema: Non-pitting LUE edema, +1 RLE, LLE edema     Height: Ht Readings from Last 1 Encounters:  08/12/13 $RemoveB'5\' 6"'[id number]$  (1.676 m)    Weight: Wt Readings from Last 1 Encounters:  08/13/13 128 lb 1.4 oz (58.1 kg)    Ideal Body Weight: 142 lb   % Ideal Body Weight: 90%  Wt Readings from Last 10 Encounters:  08/13/13 128 lb 1.4 oz (58.1 kg)  08/10/13 126 lb 1.7 oz (57.2 kg)  04/21/13 124 lb 5.4 oz (56.4 kg)  04/21/13 124 lb 5.4 oz (56.4 kg)    Usual Body Weight: 124 lb in September 2014  % Usual Body Weight: 103%  BMI:  Body mass index is 20.68 kg/(m^2).  Estimated Nutritional Needs: Kcal: 1450-1650 Protein: 70-90g Fluid: 1.4-1.6L/day  Skin: Non-pitting LUE edema, +1 RLE, LLE edema   Diet Order: Dysphagia 2, nectar thick   EDUCATION NEEDS: -No education needs identified at this time   Intake/Output Summary (Last 24 hours) at 08/13/13 1223 Last data filed at 08/13/13 0852  Gross per 24 hour  Intake    718 ml  Output    500 ml  Net    218 ml    Last BM: 1/8   Labs:   Recent Labs Lab 08/10/13 0350 08/12/13 1649 08/13/13 0749  NA 149* 147 139  K 3.9 4.6 3.8  CL 104 100  93*  CO2 38* 35* 38*  BUN 46* 39* 43*  CREATININE 1.20 1.24 1.19  CALCIUM 7.8* 8.2* 7.6*  GLUCOSE 151* 178* 328*    CBG (last 3)   Recent Labs  08/12/13 1957 08/13/13 0747 08/13/13 1139  GLUCAP 196* 269* 316*    Scheduled Meds: . aspirin EC  81 mg Oral Daily  . carvedilol  3.125 mg Oral BID WC  . ceFEPime (MAXIPIME) IV  1 g Intravenous Q24H  . enoxaparin (LOVENOX) injection  40 mg Subcutaneous Q24H  . furosemide  40 mg Intravenous Q12H  .  insulin aspart  0-9 Units Subcutaneous TID WC  . insulin glargine  15 Units Subcutaneous QHS  . lisinopril  20 mg Oral Daily  . sodium chloride  3 mL Intravenous Q12H  . vancomycin  500 mg Intravenous Q12H    Continuous Infusions:   Past Medical History  Diagnosis Date  . Hypertension   . Diabetes mellitus   . Symptomatic bradycardia     s/p pacemaker  . CHF (congestive heart failure)   . Dysrhythmia   . Shortness of breath   . Pacemaker   . Anginal pain     PER PATIENT  . Pulmonary hypertension 04/15/2013  . HTN (hypertension) 08/06/2013  . Protein-calorie malnutrition, severe 08/06/2013  . Ex-cigarette smoker 08/08/2013    Past Surgical History  Procedure Laterality Date  . Pacemaker insertion  06/16/2010    Biotronik devise at [place]  . Neck surgery    . Joint replacement      bilateral hip  . Cholecystectomy      Mikey College MS, [place], [place] Pager [phone] After Hours Pager

## 2013-08-14 ENCOUNTER — Inpatient Hospital Stay (HOSPITAL_COMMUNITY): Payer: Medicare Other

## 2013-08-14 DIAGNOSIS — M7989 Other specified soft tissue disorders: Secondary | ICD-10-CM

## 2013-08-14 DIAGNOSIS — M79609 Pain in unspecified limb: Secondary | ICD-10-CM

## 2013-08-14 LAB — BASIC METABOLIC PANEL
BUN: 39 mg/dL — ABNORMAL HIGH (ref 6–23)
CALCIUM: 7.2 mg/dL — AB (ref 8.4–10.5)
CO2: 40 mEq/L (ref 19–32)
CREATININE: 1.2 mg/dL (ref 0.50–1.35)
Chloride: 96 mEq/L (ref 96–112)
GFR calc Af Amer: 65 mL/min — ABNORMAL LOW (ref >90)
GFR, EST NON AFRICAN AMERICAN: 56 mL/min — AB (ref >90)
GLUCOSE: 150 mg/dL — AB (ref 70–99)
Potassium: 3.5 mEq/L — ABNORMAL LOW (ref 3.7–5.3)
Sodium: 142 mEq/L (ref 137–147)

## 2013-08-14 LAB — GLUCOSE, CAPILLARY
GLUCOSE-CAPILLARY: 68 mg/dL — AB (ref 70–99)
GLUCOSE-CAPILLARY: 142 mg/dL — AB (ref 70–99)
GLUCOSE-CAPILLARY: 120 mg/dL — AB (ref 70–99)
Glucose-Capillary: 74 mg/dL (ref 70–99)
Glucose-Capillary: 95 mg/dL (ref 70–99)

## 2013-08-14 LAB — PROTIME-INR
INR: 1.1 (ref 0.00–1.49)
Prothrombin Time: 14 seconds (ref 11.6–15.2)

## 2013-08-14 LAB — APTT: aPTT: 29 seconds (ref 24–37)

## 2013-08-14 MED ORDER — RESOURCE THICKENUP CLEAR PO POWD
ORAL | Status: DC | PRN
  Filled 2013-08-14: qty 125

## 2013-08-14 MED ORDER — HEPARIN BOLUS VIA INFUSION
3000.0000 [IU] | Freq: Once | INTRAVENOUS | Status: AC
  Administered 2013-08-14: 21:00:00 3000 [IU] via INTRAVENOUS
  Filled 2013-08-14: qty 3000

## 2013-08-14 MED ORDER — HEPARIN (PORCINE) IN NACL 100-0.45 UNIT/ML-% IJ SOLN
950.0000 [IU]/h | INTRAMUSCULAR | Status: DC
  Administered 2013-08-14: 950 [IU]/h via INTRAVENOUS
  Filled 2013-08-14: qty 250

## 2013-08-14 MED ORDER — FUROSEMIDE 10 MG/ML IJ SOLN
20.0000 mg | Freq: Two times a day (BID) | INTRAMUSCULAR | Status: DC
  Administered 2013-08-14 – 2013-08-15 (×2): 20 mg via INTRAVENOUS
  Filled 2013-08-14 (×3): qty 2

## 2013-08-14 MED ORDER — POTASSIUM CHLORIDE 10 MEQ/100ML IV SOLN
10.0000 meq | INTRAVENOUS | Status: AC
  Administered 2013-08-14 (×4): 10 meq via INTRAVENOUS
  Filled 2013-08-14 (×4): qty 100

## 2013-08-14 NOTE — Progress Notes (Signed)
ANTICOAGULATION CONSULT NOTE - Initial Consult  Pharmacy Consult for Heparin Indication: VTE treatment of superficial thrombosis  No Known Allergies  Patient Measurements: Height: 5\' 6"  (167.6 cm) Weight: 130 lb 1.1 oz (59 kg) IBW/kg (Calculated) : 63.8 Heparin Dosing Weight: 59kg  Vital Signs: Temp: 98.2 F (36.8 C) (01/10 1432) Temp src: Oral (01/10 1432) BP: 133/109 mmHg (01/10 1432) Pulse Rate: 71 (01/10 1432)  Labs:  Recent Labs  08/12/13 1649 08/12/13 1800 08/13/13 0749 08/14/13 0440  HGB  --  13.8  --   --   HCT  --  45.3  --   --   PLT  --  70*  --   --   CREATININE 1.24  --  1.19 1.20    Estimated Creatinine Clearance: 41.7 ml/min (by C-G formula based on Cr of 1.2).   Medical History: Past Medical History  Diagnosis Date  . Hypertension   . Diabetes mellitus   . Symptomatic bradycardia     s/p pacemaker  . CHF (congestive heart failure)   . Dysrhythmia   . Shortness of breath   . Pacemaker   . Anginal pain     PER PATIENT  . Pulmonary hypertension 04/15/2013  . HTN (hypertension) 08/06/2013  . Protein-calorie malnutrition, severe 08/06/2013  . Ex-cigarette smoker 08/08/2013    Medications:  Scheduled:  . aspirin EC  81 mg Oral Daily  . ceFEPime (MAXIPIME) IV  1 g Intravenous Q24H  . feeding supplement (ENSURE COMPLETE)  237 mL Oral TID BM  . furosemide  20 mg Intravenous BID  . insulin aspart  0-9 Units Subcutaneous TID WC  . insulin glargine  15 Units Subcutaneous QHS  . potassium chloride  10 mEq Intravenous Q1 Hr x 4  . sodium chloride  3 mL Intravenous Q12H  . vancomycin  500 mg Intravenous Q12H   Infusions:   PRN: sodium chloride, acetaminophen, ondansetron (ZOFRAN) IV, oxyCODONE, RESOURCE THICKENUP CLEAR, sodium chloride  Assessment: 80 yo M who was recently hospitalized for acute on chronic congestive heart failure. Discharged 1/8 then readmitted for HCAP and on broad spectrum antibiotics.  Doppler shows superficial thrombosis--left  basilic vein. Goal of Therapy:  Heparin level 0.3-0.7 units/ml Monitor platelets by anticoagulation protocol: Yes   Plan:   Give 3000 units bolus x 1  Start heparin infusion at 950 units/hr  Continue to monitor H&H and platelets  Check HL 8 hours after starting bolus and infusion and daily HL thereafter.  Loletta Specter 08/14/2013,7:32 PM

## 2013-08-14 NOTE — Procedures (Signed)
Objective Swallowing Evaluation: Modified Barium Swallowing Study  Patient Details  Name: Frederick [name] MRN: [id number] Date of Birth: 17-Dec-1931  Today's Date: 08/14/2013 Time: 1015-1110 SLP Time Calculation (min): 55 min  Past Medical History:  Past Medical History  Diagnosis Date  . Hypertension   . Diabetes mellitus   . Symptomatic bradycardia     s/p pacemaker  . CHF (congestive heart failure)   . Dysrhythmia   . Shortness of breath   . Pacemaker   . Anginal pain     PER PATIENT  . Pulmonary hypertension 04/15/2013  . HTN (hypertension) 08/06/2013  . Protein-calorie malnutrition, severe 08/06/2013  . Ex-cigarette smoker 08/08/2013   Past Surgical History:  Past Surgical History  Procedure Laterality Date  . Pacemaker insertion  06/16/2010    Biotronik devise at [place]  . Neck surgery    . Joint replacement      bilateral hip  . Cholecystectomy     HPI:  Frederick [name] is a 80 y.o. male Past medical history of dysphagia, diabetes and a recent hospitalization for treatment of acute on chronic congestive heart failure who was just discharged from the hospitalist service 2 days ago and came in after a hypoglycemic event. Family, patient not been eating much lately and when was evaluated by home health, they gave him his home meds. Patient became unresponsive and CBG checked and found to be low. Reportedly at 40. EMS: Patient given some D50 and CBG improved to 61. At that time patient became more alert. He is brought into the emergency room for further evaluation. Labs in the emergency room were unremarkable except for a BNP of 61,000 (on prior admission, BNP was greater than 70,000) in her and chest x-ray denoting questionable new lower lobe infiltrate and versus edema. MD reports "I suspect very much that this may be more aspiration pneumonia. When I asked patient if I can get him anything, he asked for water but prior to taking it, asked if there was any thickener in it, stating that  he did not want that. I suspect he is been noncompliant with his dysphagia diet at home. Checking pro calcitonin level to help distinguish if any of this is indeed true pneumonia"     Assessment / Plan / Recommendation Clinical Impression  Dysphagia Diagnosis: Mild oral phase dysphagia;Moderate pharyngeal phase dysphagia;Mild cervical esophageal phase dysphagia Clinical impression: Pt presents with a primary oropharyngeal dysphagia with sensorimotor deficits leading to trace, building to mild/moderate, penetration of thin liquids. Pts oral phase mildly compromised with incomplete propulsion of bolus, oral residuals.  All liquid boluses reach pyriforms before swallow is initiated. There is penetration during the swallow as pt typically sustains glottic closure and hyolaryngeal elevation during 2-3 swallows of a moderate sized bolus. There is decreased oropharyngeal persitalsis, weakness of mechanism and decreased opening of UES leading to residuals that require 3 (cued) swallows to transit. With thin there are still penetrates and residuals after the swallow that reach the vocal cords. Pts delayed cough sensation is not effective at all. Throat clears more successful, but pt needs cueing and training to utilize effectively. Recommend Dys 2 (ground) due to missing dentition and nectar thick liquids. Would not anticipate significant functional change with intervention. Pt would be best served by compensatory strategy training and family education. Prognosis for diet upgrade poor to fair unless risk is accepted by family. Wife reports pt refusal of thickener at home, worried about dehydration. May warrant conversation regarding quality of life.  Treatment Recommendation  Therapy as outlined in treatment plan below    Diet Recommendation Dysphagia 2 (Fine chop);Nectar-thick liquid   Liquid Administration via: Cup;No straw Medication Administration: Whole meds with puree Supervision: Full  supervision/cueing for compensatory strategies Compensations: Slow rate;Small sips/bites;Multiple dry swallows after each bite/sip;Clear throat intermittently Postural Changes and/or Swallow Maneuvers: Seated upright 90 degrees;Upright 30-60 min after meal    Other  Recommendations Recommended Consults: MBS Oral Care Recommendations: Oral care BID Other Recommendations: Order thickener from pharmacy   Follow Up Recommendations  Skilled Nursing facility    Frequency and Duration min 2x/week  2 weeks   Pertinent Vitals/Pain NA    SLP Swallow Goals     General HPI: Frederick [name] is a 80 y.o. male Past medical history of dysphagia, diabetes and a recent hospitalization for treatment of acute on chronic congestive heart failure who was just discharged from the hospitalist service 2 days ago and came in after a hypoglycemic event. Family, patient not been eating much lately and when was evaluated by home health, they gave him his home meds. Patient became unresponsive and CBG checked and found to be low. Reportedly at 40. EMS: Patient given some D50 and CBG improved to 61. At that time patient became more alert. He is brought into the emergency room for further evaluation. Labs in the emergency room were unremarkable except for a BNP of 61,000 (on prior admission, BNP was greater than 70,000) in her and chest x-ray denoting questionable new lower lobe infiltrate and versus edema. MD reports "I suspect very much that this may be more aspiration pneumonia. When I asked patient if I can get him anything, he asked for water but prior to taking it, asked if there was any thickener in it, stating that he did not want that. I suspect he is been noncompliant with his dysphagia diet at home. Checking pro calcitonin level to help distinguish if any of this is indeed true pneumonia" Type of Study: Modified Barium Swallowing Study Previous Swallow Assessment: BSE 08/06/13 Diet Prior to this Study: Dysphagia 2  (chopped);Nectar-thick liquids Temperature Spikes Noted: No Respiratory Status: Nasal cannula History of Recent Intubation: No Behavior/Cognition: Alert;Cooperative;Pleasant mood;Hard of hearing Oral Cavity - Dentition: Dentures, top (lost bottom dentures) Oral Motor / Sensory Function: Within functional limits Self-Feeding Abilities: Able to feed self Patient Positioning: Upright in chair Baseline Vocal Quality: Clear Volitional Cough: Strong Volitional Swallow: Able to elicit Anatomy:  (Appearance of cervical osteophytes at C5/6, C6/7) Pharyngeal Secretions: Not observed secondary MBS    Reason for Referral     Oral Phase Oral Preparation/Oral Phase Oral Phase: Impaired Oral - Honey Oral - Honey Cup: Lingual/palatal residue;Delayed oral transit;Reduced posterior propulsion;Incomplete tongue to palate contact Oral - Nectar Oral - Nectar Cup: Lingual/palatal residue;Delayed oral transit;Reduced posterior propulsion;Incomplete tongue to palate contact Oral - Thin Oral - Thin Cup: Lingual/palatal residue;Delayed oral transit;Reduced posterior propulsion;Incomplete tongue to palate contact Oral - Solids Oral - Puree: Lingual/palatal residue;Delayed oral transit;Reduced posterior propulsion;Incomplete tongue to palate contact Oral - Mechanical Soft: Lingual/palatal residue;Delayed oral transit;Reduced posterior propulsion;Incomplete tongue to palate contact;Lingual pumping Oral - Pill: Lingual/palatal residue;Delayed oral transit;Reduced posterior propulsion;Incomplete tongue to palate contact;Lingual pumping   Pharyngeal Phase Pharyngeal Phase Pharyngeal Phase: Impaired Pharyngeal - Honey Pharyngeal - Honey Cup: Delayed swallow initiation;Premature spillage to pyriform sinuses;Reduced pharyngeal peristalsis;Reduced epiglottic inversion;Reduced anterior laryngeal mobility;Reduced laryngeal elevation;Reduced airway/laryngeal closure;Reduced tongue base retraction;Penetration/Aspiration  during swallow;Penetration/Aspiration after swallow;Trace aspiration;Pharyngeal residue - valleculae;Pharyngeal residue - pyriform sinuses;Pharyngeal residue - cp segment Penetration/Aspiration  details (honey cup): Material enters airway, CONTACTS cords and not ejected out Pharyngeal - Nectar Pharyngeal - Nectar Cup: Delayed swallow initiation;Premature spillage to pyriform sinuses;Reduced pharyngeal peristalsis;Reduced epiglottic inversion;Reduced anterior laryngeal mobility;Reduced laryngeal elevation;Reduced airway/laryngeal closure;Reduced tongue base retraction;Penetration/Aspiration during swallow;Penetration/Aspiration after swallow;Pharyngeal residue - valleculae;Pharyngeal residue - pyriform sinuses;Pharyngeal residue - cp segment Penetration/Aspiration details (nectar cup): Material enters airway, remains ABOVE vocal cords and not ejected out;Material does not enter airway Pharyngeal - Thin Pharyngeal - Thin Cup: Delayed swallow initiation;Premature spillage to pyriform sinuses;Reduced pharyngeal peristalsis;Reduced epiglottic inversion;Reduced anterior laryngeal mobility;Reduced laryngeal elevation;Reduced airway/laryngeal closure;Reduced tongue base retraction;Penetration/Aspiration during swallow;Penetration/Aspiration after swallow;Trace aspiration;Pharyngeal residue - valleculae;Pharyngeal residue - pyriform sinuses;Pharyngeal residue - cp segment Penetration/Aspiration details (thin cup): Material enters airway, CONTACTS cords and not ejected out Pharyngeal - Solids Pharyngeal - Puree: Delayed swallow initiation;Reduced pharyngeal peristalsis;Reduced epiglottic inversion;Reduced anterior laryngeal mobility;Reduced laryngeal elevation;Reduced airway/laryngeal closure;Reduced tongue base retraction;Pharyngeal residue - valleculae;Pharyngeal residue - pyriform sinuses;Pharyngeal residue - cp segment;Premature spillage to valleculae Penetration/Aspiration details (puree): Material does not  enter airway Pharyngeal - Mechanical Soft: Delayed swallow initiation;Reduced pharyngeal peristalsis;Reduced epiglottic inversion;Reduced anterior laryngeal mobility;Reduced laryngeal elevation;Reduced airway/laryngeal closure;Reduced tongue base retraction;Pharyngeal residue - valleculae;Pharyngeal residue - pyriform sinuses;Pharyngeal residue - cp segment;Premature spillage to valleculae Pharyngeal - Pill: Delayed swallow initiation;Reduced pharyngeal peristalsis;Reduced epiglottic inversion;Reduced anterior laryngeal mobility;Reduced laryngeal elevation;Reduced airway/laryngeal closure;Reduced tongue base retraction;Pharyngeal residue - valleculae;Pharyngeal residue - pyriform sinuses;Pharyngeal residue - cp segment;Premature spillage to valleculae  Cervical Esophageal Phase    GO    Cervical Esophageal Phase Cervical Esophageal Phase: Impaired Cervical Esophageal Phase - Comment Cervical Esophageal Comment: Decreased opening of UES, possibly due to appearance of cervical osteophytes at C5/6 C6/7. Trace backflow with honey thick and puree from cervical esophagus to pyriforms. Esophageal sweep otherwise appeared [facility] (no radiologist present to confirm).         Harlon Ditty, MA CCC-SLP [phone]  Claudine Mouton 08/14/2013, 11:23 AM

## 2013-08-14 NOTE — Progress Notes (Signed)
VASCULAR LAB PRELIMINARY  PRELIMINARY  PRELIMINARY  PRELIMINARY  Let upper extremity venous Doppler completed.    Preliminary report:  There is no DVT noted in the left upper extremity.  There is superficial thrombosis noted in the left basilic vein.  Anora Schwenke, RVT 08/14/2013, 6:45 PM

## 2013-08-14 NOTE — Progress Notes (Signed)
Hypoglycemic Event  CBG: 68  Treatment: 15 GM carbohydrate snack  Symptoms: None  Follow-up CBG: [id number] CBG Result:74  Possible Reasons for Event: Inadequate meal intake  Comments/MD notified:MD paged    Frederick [name]  Remember to initiate Hypoglycemia Order Set & complete

## 2013-08-14 NOTE — Progress Notes (Signed)
Patient ID: Frederick [name], male   DOB: 1931-10-06, 80 y.o.   MRN: [id number] [facility] PROGRESS NOTE  Frederick [name] QQV:[id number] DOB: September 15, 1931 DOA: 08/12/2013 PCP: Arvella Nigh, MD  Brief narrative:  80 y.o. male with history of dysphagia, diabetes and a recent hospitalization for treatment of acute on chronic congestive heart failure who was just discharged from the hospitalist service 2 days ago and came in after a hypoglycemic event, reportedly at 40. EMS: Patient given some D50 and CBG improved to 61. At that time patient became more alert. He was brought into the emergency room for further evaluation. Labs in the emergency room were unremarkable except for a BNP of 61,000 (on prior admission, BNP was greater than 70,000) and chest x-ray denoting questionable new lower lobe infiltrate versus edema. Hospitalists were called for further evaluation and admission.   Active Problems:  Acute respiratory failure  - secondary to HCAP with ? Pulmonary vascular congestion  - pt started on broad spectrum ABX and has responded well  - continue Vancomycin and Maxipime day #3 - continue Lasix but will lower the dose due to soft BP this AM - provide oxygen via [place]  HTN (hypertension)  - soft BP - hold Coreg and Lisinopril, lower the dose of Lasix  DM (diabetes mellitus)  - continue SSI for now and Lantus  - perhaps pt would do better off insulin at home especially if oral intake inadequate  COPD (chronic obstructive pulmonary disease)  - clinically compensated, no wheezing on exam  - continue ABX as noted above and provide oxygen  Protein-calorie malnutrition, severe  - secondary to progressive FTT and deconditioning  - encourage PO intake as pt able to tolerate  - MBS scheduled for today  HCAP (healthcare-associated pneumonia)  - management with broad spectrum ABX as noted above  Hypoglycemia  - A1C is 9.2, pt still with poor oral intake  - perhaps pt can be better off on Metformin rather  then Glipizide or Insulin upon discharge  - will discuss with family  Dysphagia  - SLP evaluation scheduled for today  LUE swelling - venous doppler to rule out DVT  Hypokalemia - will supplement and repeat BMP in AM  Consultants:  None Procedures/Studies:  Dg Chest Portable 1 View 08/12/2013 New left lower lobe airspace disease and adjacent pleural effusion suspicious for pneumonia. Antibiotics:  Vancomycin 01/09 -->  Maxipime 01/09 -->  Code Status: DNR  Family Communication: Pt at bedside  Disposition Plan: To be determined  HPI/Subjective: No events overnight.   Objective: Filed Vitals:   08/13/13 1421 08/13/13 1740 08/13/13 2055 08/14/13 0500  BP: 91/62 110/58 108/37 98/34  Pulse: 68 70 69 64  Temp: 98.3 F (36.8 C) 98.6 F (37 C) 98.9 F (37.2 C) 98.2 F (36.8 C)  TempSrc: Oral Oral Oral Oral  Resp: 18 18 20 20   Height:      Weight:    59 kg (130 lb 1.1 oz)  SpO2: 97% 96% 100% 100%    Intake/Output Summary (Last 24 hours) at 08/14/13 0704 Last data filed at 08/14/13 0500  Gross per 24 hour  Intake   1230 ml  Output   1640 ml  Net   -410 ml    Exam:   General:  Pt is alert, follows commands appropriately, not in acute distress  Cardiovascular: Regular rate and rhythm, S1/S2, no murmurs, no rubs, no gallops  Respiratory: Clear to auscultation bilaterally, no wheezing, no crackles, no rhonchi  Abdomen: Soft, non  tender, non distended, bowel sounds present, no guarding  Extremities: LUE edema   Neuro: Grossly nonfocal  Data Reviewed: Basic Metabolic Panel:  Recent Labs Lab 08/09/13 0413 08/10/13 0350 08/12/13 1649 08/13/13 0749 08/14/13 0440  NA 151* 149* 147 139 142  K 3.3* 3.9 4.6 3.8 3.5*  CL 106 104 100 93* 96  CO2 38* 38* 35* 38* 40*  GLUCOSE 222* 151* 178* 328* 150*  BUN 59* 46* 39* 43* 39*  CREATININE 1.35 1.20 1.24 1.19 1.20  CALCIUM 7.6* 7.8* 8.2* 7.6* 7.2*   CBC:  Recent Labs Lab 08/08/13 0400 08/09/13 0413  08/10/13 0350 08/12/13 1800  WBC 10.6* 9.2 9.2 9.4  NEUTROABS  --   --   --  8.2*  HGB 11.5* 10.6* 11.5* 13.8  HCT 36.0* 34.4* 36.5* 45.3  MCV 84.9 87.8 86.5 88.1  PLT 72* 76* 75* 70*   CBG:  Recent Labs Lab 08/12/13 1957 08/13/13 0747 08/13/13 1139 08/13/13 1700 08/13/13 2059  GLUCAP 196* 269* 316* 148* 181*    Recent Results (from the past 240 hour(s))  URINE CULTURE     Status: None   Collection Time    08/06/13  1:32 PM      Result Value Range Status   Specimen Description URINE, CATHETERIZED   Final   Special Requests NONE   Final   Culture  Setup Time     Final   Value: 08/06/2013 14:27     [facility]formed at [facility]   Colony Count     Final   Value: NO GROWTH     [facility] at [facility]   Culture     Final   Value: NO GROWTH     Performed at [facility]   Report Status 08/07/2013 FINAL   Final     Scheduled Meds: . aspirin EC  81 mg Oral Daily  . carvedilol  3.125 mg Oral BID WC  . ceFEPime (MAXIPIME) IV  1 g Intravenous Q24H  . enoxaparin (LOVENOX) injection  40 mg Subcutaneous Q24H  . feeding supplement (ENSURE COMPLETE)  237 mL Oral TID BM  . furosemide  40 mg Intravenous Q12H  . insulin aspart  0-9 Units Subcutaneous TID WC  . insulin glargine  15 Units Subcutaneous QHS  . lisinopril  20 mg Oral Daily  . sodium chloride  3 mL Intravenous Q12H  . vancomycin  500 mg Intravenous Q12H   Continuous Infusions:  Debbora PrestoMAGICK-MYERS, ISKRA, MD  [facility] Pager [phone]  If 7PM-7AM, please contact night-coverage [email or web address] Password [email or web address] 08/14/2013, 7:04 AM   LOS: 2 days

## 2013-08-14 NOTE — Progress Notes (Signed)
Superficial thrombosis noted on doppler. Will start heparin per pharmacy.   Debbora Presto, MD  [facility] Pager [phone]  If 7PM-7AM, please contact night-coverage [email or web address] Password [email or web address]

## 2013-08-14 NOTE — Evaluation (Signed)
Clinical/Bedside Swallow Evaluation Patient Details  Name: Frederick [name] MRN: [id number] Date of Birth: 07/24/1932  Today's Date: 08/14/2013 Time: [id number] SLP Time Calculation (min): 18 min  Past Medical History:  Past Medical History  Diagnosis Date  . Hypertension   . Diabetes mellitus   . Symptomatic bradycardia     s/p pacemaker  . CHF (congestive heart failure)   . Dysrhythmia   . Shortness of breath   . Pacemaker   . Anginal pain     PER PATIENT  . Pulmonary hypertension 04/15/2013  . HTN (hypertension) 08/06/2013  . Protein-calorie malnutrition, severe 08/06/2013  . Ex-cigarette smoker 08/08/2013   Past Surgical History:  Past Surgical History  Procedure Laterality Date  . Pacemaker insertion  06/16/2010    Biotronik devise at [facility]  . Neck surgery    . Joint replacement      bilateral hip  . Cholecystectomy     HPI:  Frederick [name] is a 80 y.o. male Past medical history of dysphagia, diabetes and a recent hospitalization for treatment of acute on chronic congestive heart failure who was just discharged from the hospitalist service 2 days ago and came in after a hypoglycemic event. Family, patient not been eating much lately and when was evaluated by home health, they gave him his home meds. Patient became unresponsive and CBG checked and found to be low. Reportedly at 40. EMS: Patient given some D50 and CBG improved to 61. At that time patient became more alert. He is brought into the emergency room for further evaluation. Labs in the emergency room were unremarkable except for a BNP of 61,000 (on prior admission, BNP was greater than 70,000) in her and chest x-ray denoting questionable new lower lobe infiltrate and versus edema. MD reports "I suspect very much that this may be more aspiration pneumonia. When I asked patient if I can get him anything, he asked for water but prior to taking it, asked if there was any thickener in it, stating that he did not want that. I suspect  he is been noncompliant with his dysphagia diet at home. Checking pro calcitonin level to help distinguish if any of this is indeed true pneumonia"   Assessment / Plan / Recommendation Clinical Impression  Pt demonstrates evidence of aspiration across consistencies. Particularly concerned for cervical esopahgeal dysphagia and pharyngeal residuals based on persistent wet vocal quality. Given suspicion of aspiration pna, Objective test warranted to determine pt safety with PO. Will proceed with MBS today.     Aspiration Risk  Severe    Diet Recommendation NPO        Other  Recommendations Recommended Consults: MBS   Follow Up Recommendations  Skilled Nursing facility    Frequency and Duration        Pertinent Vitals/Pain NA    SLP Swallow Goals     Swallow Study Prior Functional Status       General HPI: Frederick [name] is a 80 y.o. male Past medical history of dysphagia, diabetes and a recent hospitalization for treatment of acute on chronic congestive heart failure who was just discharged from the hospitalist service 2 days ago and came in after a hypoglycemic event. Family, patient not been eating much lately and when was evaluated by home health, they gave him his home meds. Patient became unresponsive and CBG checked and found to be low. Reportedly at 40. EMS: Patient given some D50 and CBG improved to 61. At that time patient became more alert. He is brought  into the emergency room for further evaluation. Labs in the emergency room were unremarkable except for a BNP of 61,000 (on prior admission, BNP was greater than 70,000) in her and chest x-ray denoting questionable new lower lobe infiltrate and versus edema. MD reports "I suspect very much that this may be more aspiration pneumonia. When I asked patient if I can get him anything, he asked for water but prior to taking it, asked if there was any thickener in it, stating that he did not want that. I suspect he is been noncompliant  with his dysphagia diet at home. Checking pro calcitonin level to help distinguish if any of this is indeed true pneumonia" Type of Study: Bedside swallow evaluation Previous Swallow Assessment: BSE 08/06/13 Diet Prior to this Study: Dysphagia 2 (chopped);Nectar-thick liquids Temperature Spikes Noted: No Respiratory Status: Nasal cannula History of Recent Intubation: No Behavior/Cognition: Alert;Cooperative;Pleasant mood;Hard of hearing Oral Cavity - Dentition: Edentulous Self-Feeding Abilities: Able to feed self Patient Positioning: Upright in bed Baseline Vocal Quality: Clear Volitional Cough: Strong Volitional Swallow: Able to elicit    Oral/Motor/Sensory Function Overall Oral Motor/Sensory Function: Appears within functional limits for tasks assessed   Ice Chips     Thin Liquid Thin Liquid: Impaired Presentation: Cup;Self Fed Pharyngeal  Phase Impairments: Multiple swallows;Wet Vocal Quality;Cough - Immediate    Nectar Thick Nectar Thick Liquid: Impaired Presentation: Cup;Self Fed Pharyngeal Phase Impairments: Multiple swallows;Wet Vocal Quality;Cough - Immediate   Honey Thick Honey Thick Liquid: Not tested   Puree Puree: Impaired Presentation: Self Fed;Spoon Pharyngeal Phase Impairments: Wet Vocal Quality;Multiple swallows   Solid   GO    Solid: Not tested       Chipper Koudelka, Riley Nearing 08/14/2013,10:02 AM

## 2013-08-15 LAB — BASIC METABOLIC PANEL
BUN: 32 mg/dL — ABNORMAL HIGH (ref 6–23)
CALCIUM: 7.1 mg/dL — AB (ref 8.4–10.5)
CHLORIDE: 97 meq/L (ref 96–112)
CO2: 38 mEq/L — ABNORMAL HIGH (ref 19–32)
Creatinine, Ser: 1.02 mg/dL (ref 0.50–1.35)
GFR calc Af Amer: 79 mL/min — ABNORMAL LOW (ref >90)
GFR calc non Af Amer: 68 mL/min — ABNORMAL LOW (ref >90)
Glucose, Bld: 35 mg/dL — CL (ref 70–99)
Potassium: 3.8 mEq/L (ref 3.7–5.3)
Sodium: 140 mEq/L (ref 137–147)

## 2013-08-15 LAB — GLUCOSE, CAPILLARY
GLUCOSE-CAPILLARY: 98 mg/dL (ref 70–99)
GLUCOSE-CAPILLARY: 97 mg/dL (ref 70–99)
GLUCOSE-CAPILLARY: 147 mg/dL — AB (ref 70–99)
Glucose-Capillary: 198 mg/dL — ABNORMAL HIGH (ref 70–99)
Glucose-Capillary: 75 mg/dL (ref 70–99)

## 2013-08-15 LAB — HEPARIN LEVEL (UNFRACTIONATED): Heparin Unfractionated: 0.79 IU/mL — ABNORMAL HIGH (ref 0.30–0.70)

## 2013-08-15 LAB — CBC
HEMATOCRIT: 33.1 % — AB (ref 39.0–52.0)
HEMOGLOBIN: 10.4 g/dL — AB (ref 13.0–17.0)
MCH: 26.5 pg (ref 26.0–34.0)
MCHC: 31.4 g/dL (ref 30.0–36.0)
MCV: 84.4 fL (ref 78.0–100.0)
Platelets: 112 10*3/uL — ABNORMAL LOW (ref 150–400)
RBC: 3.92 MIL/uL — AB (ref 4.22–5.81)
RDW: 16.5 % — ABNORMAL HIGH (ref 11.5–15.5)
WBC: 7.9 10*3/uL (ref 4.0–10.5)

## 2013-08-15 LAB — VANCOMYCIN, TROUGH: Vancomycin Tr: 18 ug/mL (ref 10.0–20.0)

## 2013-08-15 MED ORDER — LISINOPRIL 20 MG PO TABS
20.0000 mg | ORAL_TABLET | Freq: Every day | ORAL | Status: DC
  Administered 2013-08-15 – 2013-08-17 (×3): 20 mg via ORAL
  Filled 2013-08-15 (×3): qty 1

## 2013-08-15 MED ORDER — FUROSEMIDE 40 MG PO TABS
40.0000 mg | ORAL_TABLET | Freq: Two times a day (BID) | ORAL | Status: DC
  Administered 2013-08-15 – 2013-08-17 (×4): 40 mg via ORAL
  Filled 2013-08-15 (×6): qty 1

## 2013-08-15 MED ORDER — OXYMETAZOLINE HCL 0.05 % NA SOLN
1.0000 | Freq: Two times a day (BID) | NASAL | Status: DC
  Administered 2013-08-15 – 2013-08-17 (×5): 1 via NASAL
  Filled 2013-08-15: qty 15

## 2013-08-15 MED ORDER — HEPARIN (PORCINE) IN NACL 100-0.45 UNIT/ML-% IJ SOLN
800.0000 [IU]/h | INTRAMUSCULAR | Status: DC
  Filled 2013-08-15: qty 250

## 2013-08-15 MED ORDER — POTASSIUM CHLORIDE 10 MEQ/100ML IV SOLN
10.0000 meq | INTRAVENOUS | Status: AC
  Administered 2013-08-15 (×4): 10 meq via INTRAVENOUS
  Filled 2013-08-15 (×4): qty 100

## 2013-08-15 MED ORDER — DEXTROSE 50 % IV SOLN
INTRAVENOUS | Status: AC
  Administered 2013-08-15: 06:00:00 50 mL
  Filled 2013-08-15: qty 50

## 2013-08-15 NOTE — Progress Notes (Signed)
ANTIBIOTIC CONSULT NOTE - FOLLOW UP  Pharmacy Consult for Vancomycin Indication: pneumonia  Estimated Creatinine Clearance: 50.2 ml/min (by C-G formula based on Cr of 1.02).  Recent Labs  08/15/13 1902  VANCOTROUGH 18.0     Assessment: 11 yoM with recent hospitalization for AoCHF and influenza now admitted to [facility] 1/8 with HCAP and hypoglycemia.  Day # 4/7 Vancomycin and Cefepime  Renal: CKD-II: SCr improved   Vancomycin trough level (18) is in the goal range.  Goal of Therapy:  Vancomycin trough level 15-20 mcg/ml  Plan:  Continue Vancomycin 500mg  IV q12h   Lynann Beaver PharmD, BCPS Pager [phone] 08/15/2013 7:52 PM

## 2013-08-15 NOTE — Progress Notes (Signed)
0515 pt noted to have nose bleed from both nostrils, pt placed on side with head elevated, pressure put on nares and ice, suction set up.  Called beth rph to notifidy of nose bleed and asked if I could turn off heparin drip.  She said no at this time and i was to get lab to draw heparin level stat.  Lab called and in to draw lab.   Pt still bleeding .  Lab reports heparin level is 0.79, and glucose is 35.  76ml of D5w given.  RPH notified and allows me to turn off heparin for 30 minutes.  vss 97.5, 81, 147/48, 97% on 2l/m, rr 18.  contiuing to hold pressure, giving pt a 4 0z thickened milk snack.  Mary lynch on  Call and notified..  Repeat cbg 98.  Afrin given and ordered to hold heparin until md in to see.  Pt still bleeding but it is slowing at 0615.  Lab reported cbc clotted and are in now to do repeat.  plt were low on last lab.  0615 lab in to redraw.  Nose still bleeding, 2nd dose of afrin given per mary lynch NP.  Continuing to hold pressure, apply ice.  Corrie Dandy states that EMT will not be able to come in until after change of shift.

## 2013-08-15 NOTE — Progress Notes (Signed)
Patient ID: Frederick [name], male   DOB: 10/14/1931, 80 y.o.   MRN: [id number]  [facility] PROGRESS NOTE  Frederick [name] QMV:[id number] DOB: 08/28/1931 DOA: 08/12/2013 PCP: Arvella NighADCOCK, JIMMIE, MD  Brief narrative:  80 y.o. male with history of dysphagia, diabetes and a recent hospitalization for treatment of acute on chronic congestive heart failure who was just discharged from the hospitalist service 2 days ago and came in after a hypoglycemic event, reportedly at 40. EMS: Patient given some D50 and CBG improved to 61. At that time patient became more alert. He was brought into the emergency room for further evaluation. Labs in the emergency room were unremarkable except for a BNP of 61,000 (on prior admission, BNP was greater than 70,000) and chest x-ray denoting questionable new lower lobe infiltrate versus edema. Hospitalists were called for further evaluation and admission.   Active Problems:  Acute respiratory failure  - secondary to HCAP with ? Pulmonary vascular congestion  - pt started on broad spectrum ABX and has responded well  - continue Vancomycin and Maxipime day #4/7 - continue Lasix and since BP more improved will increase the dose of Lasix to home dose  - provide oxygen via [place]  HTN (hypertension)  - improved, will increase the dose of Lasix to home regimen and we can add Lisinopril today - of BP remains stable we can add Coreg as per home medical regimen  DM (diabetes mellitus)  - continue SSI for now but will hold Lantus due to hypoglycemia - consider adding Lantus once pt's oral intake improves  COPD (chronic obstructive pulmonary disease)  - clinically compensated, no wheezing on exam  - continue ABX as noted above and provide oxygen  Protein-calorie malnutrition, severe  - secondary to progressive FTT and deconditioning  - encourage PO intake as pt able to tolerate  - MBS done and ysphagia 2 (Fine chop); Nectar-thick liquid diet recommended  HCAP (healthcare-associated  pneumonia)  - management with broad spectrum ABX as noted above  Hypoglycemia  - A1C is 9.2, pt still with poor oral intake  - perhaps pt can be better off on Metformin rather then Glipizide or Insulin upon discharge  - discussed with wife at bedside and she agrees  Dysphagia  - SLP evaluation done, MBS completed, rec's as noted above  LUE swelling  - venous doppler positive for superficial thrombosis - no heparin indicated - pt also had epistaxis with Hg drop over 3 points, so heparin contraindicated  Hypokalemia  - WNL this AM Acute blood loss anemia - with epistaxis upon initiation of heparin - no heparin indicated for superficial venous thrombosis - ensure that extremity is elevated adn can apply ted hose to help wit swelling   Consultants:  None Procedures/Studies:  Dg Chest Portable 1 View 08/12/2013 New left lower lobe airspace disease and adjacent pleural effusion suspicious for pneumonia. Antibiotics:  Vancomycin 01/09 -->  Maxipime 01/09 -->  Code Status: DNR  Family Communication: Pt at bedside  Disposition Plan: To be determined  HPI/Subjective: No events overnight.   Objective: Filed Vitals:   08/14/13 0841 08/14/13 1432 08/14/13 2256 08/15/13 0500  BP: 100/33 133/109 141/43 147/48  Pulse:  71 73 81  Temp:  98.2 F (36.8 C) 99.2 F (37.3 C) 97.5 F (36.4 C)  TempSrc:  Oral Oral Axillary  Resp:  20 20 20   Height:      Weight:    60.4 kg (133 lb 2.5 oz)  SpO2:  100% 100% 97%  Intake/Output Summary (Last 24 hours) at 08/15/13 1318 Last data filed at 08/15/13 0700  Gross per 24 hour  Intake    949 ml  Output   1253 ml  Net   -304 ml    Exam:   General:  Pt is alert, follows commands appropriately, not in acute distress  Cardiovascular: Regular rate and rhythm, S1/S2, no murmurs, no rubs, no gallops  Respiratory: Clear to auscultation bilaterally, no wheezing, mild bibasilar rhonchi   Abdomen: Soft, non tender, non distended, bowel sounds  present, no guarding  Extremities: No edema, pulses DP and PT palpable bilaterally  Neuro: Grossly nonfocal  Data Reviewed: Basic Metabolic Panel:  Recent Labs Lab 08/10/13 0350 08/12/13 1649 08/13/13 0749 08/14/13 0440 08/15/13 0505  NA 149* 147 139 142 140  K 3.9 4.6 3.8 3.5* 3.8  CL 104 100 93* 96 97  CO2 38* 35* 38* 40* 38*  GLUCOSE 151* 178* 328* 150* 35*  BUN 46* 39* 43* 39* 32*  CREATININE 1.20 1.24 1.19 1.20 1.02  CALCIUM 7.8* 8.2* 7.6* 7.2* 7.1*   CBC:  Recent Labs Lab 08/09/13 0413 08/10/13 0350 08/12/13 1800 08/15/13 0626  WBC 9.2 9.2 9.4 7.9  NEUTROABS  --   --  8.2*  --   HGB 10.6* 11.5* 13.8 10.4*  HCT 34.4* 36.5* 45.3 33.1*  MCV 87.8 86.5 88.1 84.4  PLT 76* 75* 70* 112*   CBG:  Recent Labs Lab 08/14/13 1706 08/14/13 2253 08/15/13 0551 08/15/13 0740 08/15/13 1217  GLUCAP 120* 198* 98 75 97    Recent Results (from the past 240 hour(s))  URINE CULTURE     Status: None   Collection Time    08/06/13  1:32 PM      Result Value Range Status   Specimen Description URINE, CATHETERIZED   Final   Special Requests NONE   Final   Culture  Setup Time     Final   Value: 08/06/2013 14:27     Performed at [facility] Count     Final   Value: NO GROWTH     Performed at [facility]   Culture     Final   Value: NO GROWTH     Performed at [facility]   Report Status 08/07/2013 FINAL   Final     Scheduled Meds: . aspirin EC  81 mg Oral Daily  . ceFEPime (MAXIPIME) IV  1 g Intravenous Q24H  . feeding supplement (ENSURE COMPLETE)  237 mL Oral TID BM  . furosemide  20 mg Intravenous BID  . insulin aspart  0-9 Units Subcutaneous TID WC  . insulin glargine  15 Units Subcutaneous QHS  . oxymetazoline  1 spray Each Nare BID  . sodium chloride  3 mL Intravenous Q12H  . vancomycin  500 mg Intravenous Q12H   Continuous Infusions:   Debbora Presto, MD  [facility] Pager [phone]  If 7PM-7AM, please contact  night-coverage [email or web address] Password [email or web address] 08/15/2013, 1:18 PM   LOS: 3 days

## 2013-08-15 NOTE — Progress Notes (Signed)
ANTICOAGULATION CONSULT NOTE - F/U Consult  Pharmacy Consult for Heparin Indication: VTE treatment of superficial thrombosis  No Known Allergies  Patient Measurements: Height: 5\' 6"  (167.6 cm) Weight: 130 lb 1.1 oz (59 kg) IBW/kg (Calculated) : 63.8 Heparin Dosing Weight: 59kg  Vital Signs: Temp: 99.2 F (37.3 C) (01/10 2256) Temp src: Oral (01/10 2256) BP: 141/43 mmHg (01/10 2256) Pulse Rate: 73 (01/10 2256)  Labs:  Recent Labs  08/12/13 1800 08/13/13 0749 08/14/13 0440 08/14/13 2010 08/15/13 0505  HGB 13.8  --   --   --   --   HCT 45.3  --   --   --   --   PLT 70*  --   --   --   --   APTT  --   --   --  29  --   LABPROT  --   --   --  14.0  --   INR  --   --   --  1.10  --   HEPARINUNFRC  --   --   --   --  0.79*  CREATININE  --  1.19 1.20  --  1.02    Estimated Creatinine Clearance: 49 ml/min (by C-G formula based on Cr of 1.02).   Medical History: Past Medical History  Diagnosis Date  . Hypertension   . Diabetes mellitus   . Symptomatic bradycardia     s/p pacemaker  . CHF (congestive heart failure)   . Dysrhythmia   . Shortness of breath   . Pacemaker   . Anginal pain     PER PATIENT  . Pulmonary hypertension 04/15/2013  . HTN (hypertension) 08/06/2013  . Protein-calorie malnutrition, severe 08/06/2013  . Ex-cigarette smoker 08/08/2013    Medications:  Scheduled:  . aspirin EC  81 mg Oral Daily  . ceFEPime (MAXIPIME) IV  1 g Intravenous Q24H  . dextrose      . feeding supplement (ENSURE COMPLETE)  237 mL Oral TID BM  . furosemide  20 mg Intravenous BID  . insulin aspart  0-9 Units Subcutaneous TID WC  . insulin glargine  15 Units Subcutaneous QHS  . sodium chloride  3 mL Intravenous Q12H  . vancomycin  500 mg Intravenous Q12H   Infusions:  . heparin     PRN: sodium chloride, acetaminophen, ondansetron (ZOFRAN) IV, oxyCODONE, RESOURCE THICKENUP CLEAR, sodium chloride  Assessment: 80 yo M who was recently hospitalized for acute on chronic  congestive heart failure. Discharged 1/8 then readmitted for HCAP and on broad spectrum antibiotics.  Doppler shows superficial thrombosis--left basilic vein.  RN called saying pt with bloody nose this am.  HL= 0.79 units/ml after 3000 bolus and drip @ 950 units/hr   CBC being repeat? F/U Goal of Therapy:  Heparin level 0.3-0.7 units/ml  Monitor platelets by anticoagulation protocol: Yes   Plan:   Hold x 30 mins (b/c of nosebleed) and restart heparin drip @ 800 units/hr  F/u HL @ 1430 and CBC results  Susanne Greenhouse R 08/15/2013,5:57 AM

## 2013-08-15 NOTE — Progress Notes (Signed)
ANTIBIOTIC CONSULT NOTE - FOLLOW UP  Pharmacy Consult for Vancomycin Indication: pneumonia  No Known Allergies  Patient Measurements: Height: 5\' 6"  (167.6 cm) Weight: 133 lb 2.5 oz (60.4 kg) IBW/kg (Calculated) : 63.8  Vital Signs: Temp: 97.7 F (36.5 C) (01/11 1445) Temp src: Oral (01/11 1445) BP: 105/38 mmHg (01/11 1445) Pulse Rate: 76 (01/11 1445) Intake/Output from previous day: 01/10 0701 - 01/11 0700 In: 949 [P.O.:700; I.V.:99; IV Piggyback:150] Out: 1253 [Urine:1250; Stool:3] Intake/Output from this shift: Total I/O In: 240 [P.O.:240] Out: 575 [Urine:575]  Labs:  Recent Labs  08/12/13 1800 08/13/13 0749 08/14/13 0440 08/15/13 0505 08/15/13 0626  WBC 9.4  --   --   --  7.9  HGB 13.8  --   --   --  10.4*  PLT 70*  --   --   --  112*  CREATININE  --  1.19 1.20 1.02  --    Estimated Creatinine Clearance: 50.2 ml/min (by C-G formula based on Cr of 1.02). No results found for this basename: Rolm Gala, VANCORANDOM, GENTTROUGH, GENTPEAK, GENTRANDOM, TOBRATROUGH, TOBRAPEAK, TOBRARND, AMIKACINPEAK, AMIKACINTROU, AMIKACIN,  in the last 72 hours   Microbiology: Recent Results (from the past 720 hour(s))  URINE CULTURE     Status: None   Collection Time    08/06/13  1:32 PM      Result Value Range Status   Specimen Description URINE, CATHETERIZED   Final   Special Requests NONE   Final   Culture  Setup Time     Final   Value: 08/06/2013 14:27     Performed at [facility] Count     Final   Value: NO GROWTH     Performed at [facility]   Culture     Final   Value: NO GROWTH     Performed at [facility]   Report Status 08/07/2013 FINAL   Final     Assessment: 67 yoM with recent hospitalization for AoCHF and influenza now admitted to [facility] 1/8 with HCAP and hypoglycemia.  Vancomycin and Cefepime started 1/8.  MD wishes to continue broad spectrum abx for now and notes patient improvement.   Day # 4/7 Vancomycin  500 mg IV q12h and Cefepime 1g IV q24h for HCAP.  Tm24h: AF  WBC: wnl  Renal: CKD-II: SCr improved to 1.02, CrCl~48 ml/min (CG), ~58 ml/min/1.69m2 (normalized)  Goal of Therapy:  Vancomycin trough level 15-20 mcg/ml  Plan:   Continue Cefepime 1g IV q24h.  Vancomycin trough tonight at 1900.  F/u plan for de-escalation of abx.  Clance Boll 08/15/2013,3:43 PM

## 2013-08-16 LAB — CBC
HCT: 36.5 % — ABNORMAL LOW (ref 39.0–52.0)
Hemoglobin: 11.7 g/dL — ABNORMAL LOW (ref 13.0–17.0)
MCH: 27.1 pg (ref 26.0–34.0)
MCHC: 32.1 g/dL (ref 30.0–36.0)
MCV: 84.5 fL (ref 78.0–100.0)
PLATELETS: 122 10*3/uL — AB (ref 150–400)
RBC: 4.32 MIL/uL (ref 4.22–5.81)
RDW: 16.6 % — ABNORMAL HIGH (ref 11.5–15.5)
WBC: 7.4 10*3/uL (ref 4.0–10.5)

## 2013-08-16 LAB — GLUCOSE, CAPILLARY
GLUCOSE-CAPILLARY: 190 mg/dL — AB (ref 70–99)
GLUCOSE-CAPILLARY: 130 mg/dL — AB (ref 70–99)
GLUCOSE-CAPILLARY: 115 mg/dL — AB (ref 70–99)
GLUCOSE-CAPILLARY: 126 mg/dL — AB (ref 70–99)
Glucose-Capillary: 161 mg/dL — ABNORMAL HIGH (ref 70–99)
Glucose-Capillary: 216 mg/dL — ABNORMAL HIGH (ref 70–99)

## 2013-08-16 LAB — BASIC METABOLIC PANEL
BUN: 26 mg/dL — ABNORMAL HIGH (ref 6–23)
CHLORIDE: 96 meq/L (ref 96–112)
CO2: 38 mEq/L — ABNORMAL HIGH (ref 19–32)
CREATININE: 0.96 mg/dL (ref 0.50–1.35)
Calcium: 7.5 mg/dL — ABNORMAL LOW (ref 8.4–10.5)
GFR calc non Af Amer: 77 mL/min — ABNORMAL LOW (ref >90)
GFR, EST AFRICAN AMERICAN: 89 mL/min — AB (ref >90)
Glucose, Bld: 126 mg/dL — ABNORMAL HIGH (ref 70–99)
POTASSIUM: 4.6 meq/L (ref 3.7–5.3)
Sodium: 139 mEq/L (ref 137–147)

## 2013-08-16 MED ORDER — DEXTROSE 5 % IV SOLN
1.0000 g | Freq: Three times a day (TID) | INTRAVENOUS | Status: DC
  Administered 2013-08-16 – 2013-08-17 (×3): 1 g via INTRAVENOUS
  Filled 2013-08-16 (×5): qty 1

## 2013-08-16 NOTE — Progress Notes (Signed)
Pt more oriented this shift.  Demonstrates ability to follow swallowing guidelines without much prompting.  Please note low grade temps.  sats stay in 90's with 1l/m [place].  However drop into high 80's on RA.  Pt will eat well if feed. Accepting thickened liquids.  LUE c ace wrap and notably less edema.  CMS wnl in LUE

## 2013-08-16 NOTE — Progress Notes (Signed)
Patient ID: Frederick [name], male   DOB: 31-Jan-1932, 80 y.o.   MRN: [id number]  [facility] PROGRESS NOTE  Zyion Smialek OMA:[id number] DOB: [birth year] DOA: 08/12/2013 PCP: Arvella Nigh, MD  Brief narrative:  80 y.o. male with history of dysphagia, diabetes and a recent hospitalization for treatment of acute on chronic congestive heart failure who was just discharged from the hospitalist service 2 days ago and came in after a hypoglycemic event, reportedly at 40. EMS: Patient given some D50 and CBG improved to 61. At that time patient became more alert. He was brought into the emergency room for further evaluation. Labs in the emergency room were unremarkable except for a BNP of 61,000 (on prior admission, BNP was greater than 70,000) and chest x-ray denoting questionable new lower lobe infiltrate versus edema. Hospitalists were called for further evaluation and admission.   Active Problems:  Acute respiratory failure  - secondary to HCAP with ? Pulmonary vascular congestion  - pt started on broad spectrum ABX and has responded well  - continue Vancomycin and Maxipime day #5/7, transition to oral ABX in AM - continue Lasix as per home medical regimen  - provide oxygen via [place]  HTN (hypertension)  - improved, will increase the dose of Lasix to home regimen and we can add Lisinopril today  - of BP remains stable we can add Coreg as per home medical regimen  DM (diabetes mellitus)  - continue SSI for now but will [name] Lantus due to hypoglycemia  - consider adding Lantus once pt's oral intake improves  COPD (chronic obstructive pulmonary disease)  - clinically compensated, no wheezing on exam  - continue ABX as noted above and provide oxygen  Protein-calorie malnutrition, severe  - secondary to progressive FTT and deconditioning  - encourage PO intake as pt able to tolerate  - MBS done and ysphagia 2 (Fine chop); Nectar-thick liquid diet recommended  HCAP (healthcare-associated pneumonia)  -  management with broad spectrum ABX as noted above  Hypoglycemia  - A1C is 9.2, pt still with poor oral intake  - perhaps pt can be better off on Metformin rather then Glipizide or Insulin upon discharge  - discussed with wife at bedside and she agrees  Dysphagia  - SLP evaluation done, MBS completed, rec's as noted above  LUE swelling  - venous doppler positive for superficial thrombosis  - no heparin indicated  - pt also had epistaxis with Hg drop over 3 points, so heparin contraindicated  Hypokalemia  - WNL this AM  Acute blood loss anemia  - with epistaxis upon initiation of heparin  - no heparin indicated for superficial venous thrombosis  - ensure that extremity is elevated adn can apply ted hose to help wit swelling  - no bleeding over 24 hours   Consultants:  None Procedures/Studies:  Dg Chest Portable 1 View 08/12/2013 New left lower lobe airspace disease and adjacent pleural effusion suspicious for pneumonia. Antibiotics:  Vancomycin 01/09 -->  Maxipime 01/09 -->  Code Status: DNR  Family Communication: Pt at bedside  Disposition Plan: To be determined  HPI/Subjective: No events overnight.   Objective: Filed Vitals:   08/16/13 0015 08/16/13 0220 08/16/13 0609 08/16/13 1354  BP:   145/41 144/43  Pulse:   75 70  Temp: 99.6 F (37.6 C) 98.7 F (37.1 C) 98.3 F (36.8 C) 98.8 F (37.1 C)  TempSrc:  Oral Oral Oral  Resp:   16 16  Height:      Weight:   61.326  kg (135 lb 3.2 oz)   SpO2:   99% 99%    Intake/Output Summary (Last 24 hours) at 08/16/13 2234 Last data filed at 08/16/13 1832  Gross per 24 hour  Intake    660 ml  Output   1500 ml  Net   -840 ml    Exam:   General:  Pt is alert, follows commands appropriately, not in acute distress  Cardiovascular: Regular rate and rhythm, S1/S2, no murmurs, no rubs, no gallops  Respiratory: Clear to auscultation bilaterally, no wheezing, no crackles, no rhonchi  Abdomen: Soft, non tender, non distended,  bowel sounds present, no guarding  Extremities: No edema, pulses DP and PT palpable bilaterally  Neuro: Grossly nonfocal  Data Reviewed: Basic Metabolic Panel:  Recent Labs Lab 08/12/13 1649 08/13/13 0749 08/14/13 0440 08/15/13 0505 08/16/13 0815  NA 147 139 142 140 139  K 4.6 3.8 3.5* 3.8 4.6  CL 100 93* 96 97 96  CO2 35* 38* 40* 38* 38*  GLUCOSE 178* 328* 150* 35* 126*  BUN 39* 43* 39* 32* 26*  CREATININE 1.24 1.19 1.20 1.02 0.96  CALCIUM 8.2* 7.6* 7.2* 7.1* 7.5*   CBC:  Recent Labs Lab 08/10/13 0350 08/12/13 1800 08/15/13 0626 08/16/13 0815  WBC 9.2 9.4 7.9 7.4  NEUTROABS  --  8.2*  --   --   HGB 11.5* 13.8 10.4* 11.7*  HCT 36.5* 45.3 33.1* 36.5*  MCV 86.5 88.1 84.4 84.5  PLT 75* 70* 112* 122*   CBG:  Recent Labs Lab 08/15/13 2159 08/16/13 0218 08/16/13 0723 08/16/13 1150 08/16/13 1652  GLUCAP 190* 130* 115* 126* 161*   Scheduled Meds: . aspirin EC  81 mg Oral Daily  . ceFEPime (MAXIPIME) IV  1 g Intravenous Q8H  . feeding supplement (ENSURE COMPLETE)  237 mL Oral TID BM  . furosemide  40 mg Oral BID  . insulin aspart  0-9 Units Subcutaneous TID WC  . lisinopril  20 mg Oral Daily  . oxymetazoline  1 spray Each Nare BID  . sodium chloride  3 mL Intravenous Q12H  . vancomycin  500 mg Intravenous Q12H   Continuous Infusions:   Debbora PrestoMAGICK-MYERS, ISKRA, MD  [facility] Pager [phone]  If 7PM-7AM, please contact night-coverage [email or web address] Password [email or web address] 08/16/2013, 10:34 PM   LOS: 4 days

## 2013-08-16 NOTE — Progress Notes (Signed)
ANTIBIOTIC CONSULT NOTE - FOLLOW UP  Pharmacy Consult for cefepime Indication: pneumonia  No Known Allergies  Patient Measurements: Height: 5\' 6"  (167.6 cm) Weight: 135 lb 3.2 oz (61.326 kg) IBW/kg (Calculated) : 63.8  Vital Signs: Temp: 98.3 F (36.8 C) (01/12 0609) Temp src: Oral (01/12 0609) BP: 145/41 mmHg (01/12 0609) Pulse Rate: 75 (01/12 0609) Intake/Output from previous day: 01/11 0701 - 01/12 0700 In: 940 [P.O.:690; IV Piggyback:250] Out: 1476 [Urine:1475; Stool:1] Intake/Output from this shift: Total I/O In: 240 [P.O.:240] Out: -   Labs:  Recent Labs  08/14/13 0440 08/15/13 0505 08/15/13 0626 08/16/13 0815  WBC  --   --  7.9 7.4  HGB  --   --  10.4* 11.7*  PLT  --   --  112* 122*  CREATININE 1.20 1.02  --  0.96   Estimated Creatinine Clearance: 54.1 ml/min (by C-G formula based on Cr of 0.96).  Recent Labs  08/15/13 1902  VANCOTROUGH 18.0     Microbiology: Recent Results (from the past 720 hour(s))  URINE CULTURE     Status: None   Collection Time    08/06/13  1:32 PM      Result Value Range Status   Specimen Description URINE, CATHETERIZED   Final   Special Requests NONE   Final   Culture  Setup Time     Final   Value: 08/06/2013 14:27     Performed at [facility] Count     Final   Value: NO GROWTH     Performed at [facility]   Culture     Final   Value: NO GROWTH     Performed at [facility]   Report Status 08/07/2013 FINAL   Final     Assessment: 75 yoM with recent hospitalization for AoCHF and influenza now admitted to [facility] 1/8 with HCAP and hypoglycemia.  Vancomycin and Cefepime started 1/8.  MD wishes to continue broad spectrum abx for now and notes patient improvement.   Day # 5/7 Vancomycin 500 mg IV q12h and Cefepime 1g IV q24h for HCAP.  Tm24h: 100.1 WBC: 7.4 Renal: CKD-II: SCr improved to 0.96, 28ml/min (CG), 29ml/min (N)  Goal of Therapy:  Vancomycin trough level 15-20  mcg/ml  Plan:   Based on improved SCr, change cefepime to 1gm IV q8h  Juliette Alcide, PharmD, BCPS.   Pager: [phone]  08/16/2013,12:23 PM

## 2013-08-17 LAB — CBC
HCT: 30.8 % — ABNORMAL LOW (ref 39.0–52.0)
Hemoglobin: 9.6 g/dL — ABNORMAL LOW (ref 13.0–17.0)
MCH: 26.3 pg (ref 26.0–34.0)
MCHC: 31.2 g/dL (ref 30.0–36.0)
MCV: 84.4 fL (ref 78.0–100.0)
PLATELETS: 120 10*3/uL — AB (ref 150–400)
RBC: 3.65 MIL/uL — AB (ref 4.22–5.81)
RDW: 16.4 % — ABNORMAL HIGH (ref 11.5–15.5)
WBC: 7.1 10*3/uL (ref 4.0–10.5)

## 2013-08-17 LAB — BASIC METABOLIC PANEL
BUN: 24 mg/dL — ABNORMAL HIGH (ref 6–23)
CALCIUM: 7.3 mg/dL — AB (ref 8.4–10.5)
CO2: 36 meq/L — AB (ref 19–32)
CREATININE: 0.9 mg/dL (ref 0.50–1.35)
Chloride: 98 mEq/L (ref 96–112)
GFR calc Af Amer: >90 mL/min (ref >90)
GFR calc non Af Amer: 79 mL/min — ABNORMAL LOW (ref >90)
Glucose, Bld: 242 mg/dL — ABNORMAL HIGH (ref 70–99)
Potassium: 4.6 mEq/L (ref 3.7–5.3)
Sodium: 140 mEq/L (ref 137–147)

## 2013-08-17 LAB — GLUCOSE, CAPILLARY
Glucose-Capillary: 249 mg/dL — ABNORMAL HIGH (ref 70–99)
Glucose-Capillary: 169 mg/dL — ABNORMAL HIGH (ref 70–99)

## 2013-08-17 MED ORDER — LEVOFLOXACIN 500 MG PO TABS: 500.0000 mg | ORAL_TABLET | Freq: Every day | ORAL | Status: DC

## 2013-08-17 MED ORDER — OXYCODONE HCL 5 MG PO TABS: 5.0000 mg | ORAL_TABLET | ORAL | Status: AC | PRN

## 2013-08-17 NOTE — Progress Notes (Signed)
Discharge to home, wife and son at bedside.  D/c instructions and follow up appointments  done and discussed with    the wife, verbalized understanding. PIV removed no s/s of infiltration or swelling. Patient discharged no complaints of any pain or discomfort.

## 2013-08-17 NOTE — Discharge Instructions (Signed)

## 2013-08-17 NOTE — Discharge Summary (Signed)
Physician Discharge Summary  Frederick [name] UJW:[id number] DOB: 08/03/1932 DOA: 08/12/2013  PCP: Arvella Nigh, MD  Admit date: 08/12/2013 Discharge date: 08/17/2013  Recommendations for Outpatient Follow-up:  1. Pt will need to follow up with PCP in 2-3 weeks post discharge 2. Please obtain BMP to evaluate electrolytes and kidney function 3. Please also check CBC to evaluate Hg and Hct levels 4. Pt discharged on Levaquin to complete therapy for PNA   Discharge Diagnoses: Acute respiratory failure secondary to PNA, HCAP, pulmonary vascular congestion   Active Problems:   HTN (hypertension)   DM (diabetes mellitus)   COPD (chronic obstructive pulmonary disease)   Protein-calorie malnutrition, severe   HCAP (healthcare-associated pneumonia)   Hypoglycemia   Dysphagia   Pneumonia  Discharge Condition: Stable  Diet recommendation: Heart healthy diet discussed in details   Brief narrative:  80 y.o. male with history of dysphagia, diabetes and a recent hospitalization for treatment of acute on chronic congestive heart failure who was just discharged from the hospitalist service 2 days ago and came in after a hypoglycemic event, reportedly at 40. EMS: Patient given some D50 and CBG improved to 61. At that time patient became more alert. He was brought into the emergency room for further evaluation. Labs in the emergency room were unremarkable except for a BNP of 61,000 (on prior admission, BNP was greater than 70,000) and chest x-ray denoting questionable new lower lobe infiltrate versus edema. Hospitalists were called for further evaluation and admission.   Active Problems:  Acute respiratory failure  - secondary to HCAP with ? Pulmonary vascular congestion  - pt started on broad spectrum ABX and has responded well  - continue Vancomycin and Maxipime day #5/7, transitioned to Levaquin upon discharge  - continue Lasix as per home medical regimen  HTN (hypertension)  - improved, will increase  the dose of Lasix to home regimen  - pt can continue taking Lisinopril and Coreg as per home medical regimen  DM (diabetes mellitus)  - reasonable inpatient control  COPD (chronic obstructive pulmonary disease)  - clinically compensated, no wheezing on exam  - continue ABX as noted above  Protein-calorie malnutrition, severe  - secondary to progressive FTT and deconditioning  - encourage PO intake as pt able to tolerate  - MBS done and dysphagia 2 (Fine chop); Nectar-thick liquid diet recommended  HCAP (healthcare-associated pneumonia)  - management with broad spectrum ABX as noted above  Hypoglycemia  - A1C is 9.2, pt still with poor oral intake  - discussed with wife at bedside and she agrees with continuing Glipizide upon discharge  Dysphagia  - SLP evaluation done, MBS completed, rec's as noted above  LUE swelling  - venous doppler positive for superficial thrombosis  - no heparin indicated  - pt also had epistaxis with Hg drop over 3 points, so heparin contraindicated  Hypokalemia  - WNL this AM  Acute blood loss anemia  - with epistaxis upon initiation of heparin  - no heparin indicated for superficial venous thrombosis  - ensure that extremity is elevated adn can apply ted hose to help wit swelling  - no bleeding over 24 hours   Consultants:  None Procedures/Studies:  Dg Chest Portable 1 View 08/12/2013 New left lower lobe airspace disease and adjacent pleural effusion suspicious for pneumonia. Antibiotics:  Vancomycin 01/09 --> 1/13 Maxipime 01/09 --> 1/13 Levaquin 1/13 upon discharge  Code Status: DNR  Family Communication: Pt at bedside, son over the phone    Discharge Exam: Filed Vitals:  08/17/13 0509  BP: 132/75  Pulse: 76  Temp: 99.3 F (37.4 C)  Resp: 16   Filed Vitals:   08/16/13 0609 08/16/13 1354 08/16/13 2200 08/17/13 0509  BP: 145/41 144/43 154/46 132/75  Pulse: 75 70 80 76  Temp: 98.3 F (36.8 C) 98.8 F (37.1 C) 99.1 F (37.3 C) 99.3 F  (37.4 C)  TempSrc: Oral Oral Oral Oral  Resp: 16 16 16 16   Height:      Weight: 61.326 kg (135 lb 3.2 oz)   61.5 kg (135 lb 9.3 oz)  SpO2: 99% 99% 97% 98%    General: Pt is alert, follows commands appropriately, not in acute distress Cardiovascular: Regular rate and rhythm, S1/S2 +, no murmurs, no rubs, no gallops Respiratory: Clear to auscultation bilaterally, no wheezing, no crackles, no rhonchi Abdominal: Soft, non tender, non distended, bowel sounds +, no guarding Extremities: no edema, no cyanosis, pulses palpable bilaterally DP and PT Neuro: Grossly nonfocal  Discharge Instructions  Discharge Orders   Future Orders Complete By Expires   Diet - low sodium heart healthy  As directed    Increase activity slowly  As directed        Medication List    STOP taking these medications       HYDROcodone-acetaminophen 5-325 MG per tablet  Commonly known as:  NORCO     oseltamivir 30 MG capsule  Commonly known as:  TAMIFLU      TAKE these medications       aspirin 81 MG EC tablet  Take 1 tablet (81 mg total) by mouth daily.     carvedilol 3.125 MG tablet  Commonly known as:  COREG  Take 1 tablet (3.125 mg total) by mouth 2 (two) times daily with a meal.     furosemide 40 MG tablet  Commonly known as:  LASIX  Take 1 tablet (40 mg total) by mouth 2 (two) times daily.     glipiZIDE 5 MG tablet  Commonly known as:  GLUCOTROL  Take 0.5 tablets (2.5 mg total) by mouth daily before breakfast.     levofloxacin 500 MG tablet  Commonly known as:  LEVAQUIN  Take 1 tablet (500 mg total) by mouth daily.     lisinopril 20 MG tablet  Commonly known as:  PRINIVIL,ZESTRIL  Take 20 mg by mouth daily.     oxyCODONE 5 MG immediate release tablet  Commonly known as:  Oxy IR/ROXICODONE  Take 1 tablet (5 mg total) by mouth every 4 (four) hours as needed for moderate pain.           Follow-up Information   Follow up with ADCOCK, JIMMIE, MD In 2 weeks.   Contact information:    [address] [place] [address] [phone]       Follow up with Debbora PrestoMAGICK-Anissia Wessells, MD. (call me at my cell phone [phone])    Specialty:  Internal Medicine   Contact information:   [address] [place] [address] [phone]        The results of significant diagnostics from this hospitalization (including imaging, microbiology, ancillary and laboratory) are listed below for reference.     Microbiology: No results found for this or any previous visit (from the past 240 hour(s)).   Labs: Basic Metabolic Panel:  Recent Labs Lab 08/13/13 0749 08/14/13 0440 08/15/13 0505 08/16/13 0815 08/17/13 0506  NA 139 142 140 139 140  K 3.8 3.5* 3.8 4.6 4.6  CL 93* 96  97 96 98  CO2 38* 40* 38* 38* 36*  GLUCOSE 328* 150* 35* 126* 242*  BUN 43* 39* 32* 26* 24*  CREATININE 1.19 1.20 1.02 0.96 0.90  CALCIUM 7.6* 7.2* 7.1* 7.5* 7.3*   CBC:  Recent Labs Lab 08/12/13 1800 08/15/13 0626 08/16/13 0815 08/17/13 0506  WBC 9.4 7.9 7.4 7.1  NEUTROABS 8.2*  --   --   --   HGB 13.8 10.4* 11.7* 9.6*  HCT 45.3 33.1* 36.5* 30.8*  MCV 88.1 84.4 84.5 84.4  PLT 70* 112* 122* 120*   BNP (last 3 results)  Recent Labs  04/18/13 0555 08/06/13 1121 08/12/13 1649  PROBNP 6223.0* >70000.0* 61891.0*   CBG:  Recent Labs Lab 08/16/13 0723 08/16/13 1150 08/16/13 1652 08/16/13 2200 08/17/13 0753  GLUCAP 115* 126* 161* 216* 249*     SIGNED: Time coordinating discharge: Over 30 minutes  Debbora Presto, MD  [facility] 08/17/2013, 10:38 AM Pager [phone]  If 7PM-7AM, please contact night-coverage [email or web address] Password [email or web address]

## 2013-08-17 NOTE — Progress Notes (Signed)
Spoke with pt's son Jeri Cos [phone]([phone]), concerning Home Health for the pt. He agreed with [facility]. Son also states that he may take pt home with him to [place], [place] and asked if [facility] [place], [place]. [name] Furbish [phone]) will service [place], [place].

## 2013-08-17 NOTE — Progress Notes (Signed)
Speech Language Pathology Treatment: Dysphagia  Patient Details Name: Audi Harlin MRN: [id number] DOB: 30-May-1932 Today's Date: 08/17/2013 Time: 0347-4259 SLP Time Calculation (min): 28 min  Assessment / Plan / Recommendation Clinical Impression  Pt today fully alert, able to participate in swallow education/exercise education.  He denies significant dysphagia, however understood reasoning for compensation strategies and dietary modifiers.    Note, pt with h/o noncompliance with dysphagia diet-thickened liquids and renal issues.  Advised pt to importance of hydration with renal issues and that if he chooses to drink think liquid-water may be best tolerated.  Further advised pt to frazier water protocol option which would allow him thin water between meals after oral care.  Per pt discussion with MD, pt may dc today, therefore SLP provided information in writing re: options.    Exercises to facilitate swallow function improvement given with demonstration, return demonstration completed. Pt denies dysphagia prior to Christmas 2014, however SLP questions if pt with sensory deficits impacting awareness.  Hopeful for continued improvement with strengthening, medical improvement.     HPI HPI: Fredrik Holderby is a 80 y.o. male Past medical history of dysphagia, diabetes and a recent hospitalization for treatment of acute on chronic congestive heart failure who was just discharged from the hospitalist service 2 days ago and came in after a hypoglycemic event. Family, patient not been eating much lately and when was evaluated by home health, they gave him his home meds. Patient became unresponsive and CBG checked and found to be low. Reportedly at 40. EMS: Patient given some D50 and CBG improved to 61. At that time patient became more alert. He is brought into the emergency room for further evaluation. Labs in the emergency room were unremarkable except for a BNP of 61,000 (on prior admission, BNP was greater  than 70,000) in her and chest x-ray denoting questionable new lower lobe infiltrate and versus edema. MD reports "I suspect very much that this may be more aspiration pneumonia. When I asked patient if I can get him anything, he asked for water but prior to taking it, asked if there was any thickener in it, stating that he did not want that. I suspect he is been noncompliant with his dysphagia diet at home. Checking pro-calcitonin level to help distinguish if any of this is indeed true pneumonia".  Pt underwent BSE/MBS during this admit and soft/nectar diet recommended.  Pt has h/o renal issues and reports he will drink thickened liquid.     Pertinent Vitals Afebrile, decreased  SLP Plan  Continue with current plan of care    Recommendations Diet recommendations: Dysphagia 2 (fine chop);Nectar-thick liquid Liquids provided via: Cup;No straw Medication Administration: Whole meds with puree Supervision: Full supervision/cueing for compensatory strategies Compensations: Slow rate;Small sips/bites;Multiple dry swallows after each bite/sip;Clear throat after each swallow Postural Changes and/or Swallow Maneuvers: Upright 30-60 min after meal;Seated upright 90 degrees              Oral Care Recommendations: Oral care BID Follow up Recommendations: Skilled Nursing facility;24 hour supervision/assistance Plan: Continue with current plan of care    GO     Donavan Burnet, MS [facility] SLP [phone]

## 2013-09-18 ENCOUNTER — Encounter (HOSPITAL_COMMUNITY): Payer: Self-pay | Admitting: Emergency Medicine

## 2013-09-18 ENCOUNTER — Emergency Department (HOSPITAL_COMMUNITY): Payer: Medicare Other

## 2013-09-18 ENCOUNTER — Inpatient Hospital Stay (HOSPITAL_COMMUNITY)
Admission: EM | Admit: 2013-09-18 | Discharge: 2013-11-03 | DRG: 193 | Disposition: E | Payer: Medicare Other | Attending: Internal Medicine | Admitting: Internal Medicine

## 2013-09-18 DIAGNOSIS — R627 Adult failure to thrive: Secondary | ICD-10-CM | POA: Diagnosis present

## 2013-09-18 DIAGNOSIS — I471 Supraventricular tachycardia, unspecified: Secondary | ICD-10-CM

## 2013-09-18 DIAGNOSIS — I498 Other specified cardiac arrhythmias: Secondary | ICD-10-CM | POA: Diagnosis not present

## 2013-09-18 DIAGNOSIS — T50995A Adverse effect of other drugs, medicaments and biological substances, initial encounter: Secondary | ICD-10-CM | POA: Diagnosis present

## 2013-09-18 DIAGNOSIS — L89109 Pressure ulcer of unspecified part of back, unspecified stage: Secondary | ICD-10-CM | POA: Diagnosis present

## 2013-09-18 DIAGNOSIS — Z7982 Long term (current) use of aspirin: Secondary | ICD-10-CM

## 2013-09-18 DIAGNOSIS — N138 Other obstructive and reflux uropathy: Secondary | ICD-10-CM | POA: Diagnosis present

## 2013-09-18 DIAGNOSIS — IMO0002 Reserved for concepts with insufficient information to code with codable children: Secondary | ICD-10-CM

## 2013-09-18 DIAGNOSIS — I82619 Acute embolism and thrombosis of superficial veins of unspecified upper extremity: Secondary | ICD-10-CM | POA: Diagnosis present

## 2013-09-18 DIAGNOSIS — R5381 Other malaise: Secondary | ICD-10-CM | POA: Diagnosis present

## 2013-09-18 DIAGNOSIS — R079 Chest pain, unspecified: Secondary | ICD-10-CM

## 2013-09-18 DIAGNOSIS — R609 Edema, unspecified: Secondary | ICD-10-CM | POA: Diagnosis not present

## 2013-09-18 DIAGNOSIS — E875 Hyperkalemia: Secondary | ICD-10-CM | POA: Diagnosis present

## 2013-09-18 DIAGNOSIS — Z7409 Other reduced mobility: Secondary | ICD-10-CM | POA: Diagnosis present

## 2013-09-18 DIAGNOSIS — D509 Iron deficiency anemia, unspecified: Secondary | ICD-10-CM | POA: Diagnosis present

## 2013-09-18 DIAGNOSIS — I428 Other cardiomyopathies: Secondary | ICD-10-CM | POA: Diagnosis present

## 2013-09-18 DIAGNOSIS — E8809 Other disorders of plasma-protein metabolism, not elsewhere classified: Secondary | ICD-10-CM | POA: Diagnosis present

## 2013-09-18 DIAGNOSIS — Z8701 Personal history of pneumonia (recurrent): Secondary | ICD-10-CM

## 2013-09-18 DIAGNOSIS — L8990 Pressure ulcer of unspecified site, unspecified stage: Secondary | ICD-10-CM | POA: Diagnosis present

## 2013-09-18 DIAGNOSIS — R0682 Tachypnea, not elsewhere classified: Secondary | ICD-10-CM | POA: Diagnosis present

## 2013-09-18 DIAGNOSIS — E119 Type 2 diabetes mellitus without complications: Secondary | ICD-10-CM

## 2013-09-18 DIAGNOSIS — R339 Retention of urine, unspecified: Secondary | ICD-10-CM | POA: Diagnosis not present

## 2013-09-18 DIAGNOSIS — N179 Acute kidney failure, unspecified: Secondary | ICD-10-CM | POA: Diagnosis present

## 2013-09-18 DIAGNOSIS — R05 Cough: Secondary | ICD-10-CM

## 2013-09-18 DIAGNOSIS — I420 Dilated cardiomyopathy: Secondary | ICD-10-CM | POA: Diagnosis present

## 2013-09-18 DIAGNOSIS — E869 Volume depletion, unspecified: Secondary | ICD-10-CM | POA: Diagnosis present

## 2013-09-18 DIAGNOSIS — N170 Acute kidney failure with tubular necrosis: Secondary | ICD-10-CM | POA: Diagnosis present

## 2013-09-18 DIAGNOSIS — D649 Anemia, unspecified: Secondary | ICD-10-CM

## 2013-09-18 DIAGNOSIS — R059 Cough, unspecified: Secondary | ICD-10-CM

## 2013-09-18 DIAGNOSIS — R1310 Dysphagia, unspecified: Secondary | ICD-10-CM

## 2013-09-18 DIAGNOSIS — I5022 Chronic systolic (congestive) heart failure: Secondary | ICD-10-CM | POA: Diagnosis present

## 2013-09-18 DIAGNOSIS — L8993 Pressure ulcer of unspecified site, stage 3: Secondary | ICD-10-CM | POA: Diagnosis present

## 2013-09-18 DIAGNOSIS — Z7401 Bed confinement status: Secondary | ICD-10-CM

## 2013-09-18 DIAGNOSIS — E874 Mixed disorder of acid-base balance: Secondary | ICD-10-CM | POA: Diagnosis present

## 2013-09-18 DIAGNOSIS — E1169 Type 2 diabetes mellitus with other specified complication: Secondary | ICD-10-CM | POA: Diagnosis present

## 2013-09-18 DIAGNOSIS — I509 Heart failure, unspecified: Secondary | ICD-10-CM | POA: Diagnosis present

## 2013-09-18 DIAGNOSIS — I5041 Acute combined systolic (congestive) and diastolic (congestive) heart failure: Secondary | ICD-10-CM

## 2013-09-18 DIAGNOSIS — N35919 Unspecified urethral stricture, male, unspecified site: Secondary | ICD-10-CM | POA: Diagnosis present

## 2013-09-18 DIAGNOSIS — N401 Enlarged prostate with lower urinary tract symptoms: Secondary | ICD-10-CM | POA: Diagnosis present

## 2013-09-18 DIAGNOSIS — L8995 Pressure ulcer of unspecified site, unstageable: Secondary | ICD-10-CM | POA: Diagnosis present

## 2013-09-18 DIAGNOSIS — E876 Hypokalemia: Secondary | ICD-10-CM | POA: Diagnosis present

## 2013-09-18 DIAGNOSIS — I248 Other forms of acute ischemic heart disease: Secondary | ICD-10-CM | POA: Diagnosis not present

## 2013-09-18 DIAGNOSIS — R197 Diarrhea, unspecified: Secondary | ICD-10-CM

## 2013-09-18 DIAGNOSIS — R195 Other fecal abnormalities: Secondary | ICD-10-CM | POA: Diagnosis present

## 2013-09-18 DIAGNOSIS — Z515 Encounter for palliative care: Secondary | ICD-10-CM

## 2013-09-18 DIAGNOSIS — J4489 Other specified chronic obstructive pulmonary disease: Secondary | ICD-10-CM | POA: Diagnosis present

## 2013-09-18 DIAGNOSIS — J189 Pneumonia, unspecified organism: Principal | ICD-10-CM | POA: Diagnosis present

## 2013-09-18 DIAGNOSIS — R600 Localized edema: Secondary | ICD-10-CM

## 2013-09-18 DIAGNOSIS — L97509 Non-pressure chronic ulcer of other part of unspecified foot with unspecified severity: Secondary | ICD-10-CM | POA: Diagnosis present

## 2013-09-18 DIAGNOSIS — R310 Gross hematuria: Secondary | ICD-10-CM | POA: Diagnosis not present

## 2013-09-18 DIAGNOSIS — I2489 Other forms of acute ischemic heart disease: Secondary | ICD-10-CM | POA: Diagnosis not present

## 2013-09-18 DIAGNOSIS — Z87891 Personal history of nicotine dependence: Secondary | ICD-10-CM

## 2013-09-18 DIAGNOSIS — M25569 Pain in unspecified knee: Secondary | ICD-10-CM | POA: Diagnosis present

## 2013-09-18 DIAGNOSIS — N365 Urethral false passage: Secondary | ICD-10-CM | POA: Diagnosis present

## 2013-09-18 DIAGNOSIS — Z79899 Other long term (current) drug therapy: Secondary | ICD-10-CM

## 2013-09-18 DIAGNOSIS — I5043 Acute on chronic combined systolic (congestive) and diastolic (congestive) heart failure: Secondary | ICD-10-CM

## 2013-09-18 DIAGNOSIS — I2789 Other specified pulmonary heart diseases: Secondary | ICD-10-CM | POA: Diagnosis present

## 2013-09-18 DIAGNOSIS — E43 Unspecified severe protein-calorie malnutrition: Secondary | ICD-10-CM | POA: Diagnosis present

## 2013-09-18 DIAGNOSIS — D638 Anemia in other chronic diseases classified elsewhere: Secondary | ICD-10-CM | POA: Diagnosis present

## 2013-09-18 DIAGNOSIS — J90 Pleural effusion, not elsewhere classified: Secondary | ICD-10-CM | POA: Diagnosis present

## 2013-09-18 DIAGNOSIS — R634 Abnormal weight loss: Secondary | ICD-10-CM | POA: Diagnosis present

## 2013-09-18 DIAGNOSIS — Z66 Do not resuscitate: Secondary | ICD-10-CM | POA: Diagnosis present

## 2013-09-18 DIAGNOSIS — R531 Weakness: Secondary | ICD-10-CM

## 2013-09-18 DIAGNOSIS — I10 Essential (primary) hypertension: Secondary | ICD-10-CM | POA: Diagnosis present

## 2013-09-18 DIAGNOSIS — R748 Abnormal levels of other serum enzymes: Secondary | ICD-10-CM | POA: Diagnosis not present

## 2013-09-18 DIAGNOSIS — D696 Thrombocytopenia, unspecified: Secondary | ICD-10-CM | POA: Diagnosis present

## 2013-09-18 DIAGNOSIS — Z950 Presence of cardiac pacemaker: Secondary | ICD-10-CM | POA: Diagnosis present

## 2013-09-18 DIAGNOSIS — Z8249 Family history of ischemic heart disease and other diseases of the circulatory system: Secondary | ICD-10-CM

## 2013-09-18 DIAGNOSIS — J9600 Acute respiratory failure, unspecified whether with hypoxia or hypercapnia: Secondary | ICD-10-CM | POA: Diagnosis present

## 2013-09-18 DIAGNOSIS — J449 Chronic obstructive pulmonary disease, unspecified: Secondary | ICD-10-CM | POA: Diagnosis present

## 2013-09-18 DIAGNOSIS — E162 Hypoglycemia, unspecified: Secondary | ICD-10-CM

## 2013-09-18 DIAGNOSIS — B370 Candidal stomatitis: Secondary | ICD-10-CM | POA: Diagnosis present

## 2013-09-18 DIAGNOSIS — R34 Anuria and oliguria: Secondary | ICD-10-CM | POA: Diagnosis present

## 2013-09-18 NOTE — ED Notes (Signed)
Per EMS, pt's family called to report pt having decreased mobility.  Pt's only c/o is b/l hip burning.

## 2013-09-18 NOTE — ED Notes (Signed)
Pt c/o SOB, generalized weakness and b/l hip pain 8/10 burning in nature.  On exam pt has ecchymosis to his right side, b/l arms and back.  Pt reports multiple falls, the last being 4 weeks ago.

## 2013-09-18 NOTE — ED Notes (Signed)
Turned Pt. To left side.

## 2013-09-18 NOTE — ED Provider Notes (Signed)
CSN: [id number]     Arrival date & time 09/20/2013  2138 History   First MD Initiated Contact with Patient 09/13/2013 2306     Chief Complaint  Patient presents with  . Weakness  . Shortness of Breath     (Consider location/radiation/quality/duration/timing/severity/associated sxs/prior Treatment) HPI Patient is a 80 year old male who lives alone at home with his wife. He was recently discharged from the [facility] in per granddaughter has been unable to care for himself. He complains of generalized weakness and is unable to ambulate. He has developed bilateral sacral ulcers and wounds to bilateral heels. He also reports increased shortness of breath requiring 2-3 L of oxygen. He has ongoing cough without change. He denies fever or chills. Patient reports increased urinary frequency but denies dysuria or hematuria. He denies vomiting or diarrhea. Has no chest pain or abdominal pain. Past Medical History  Diagnosis Date  . Hypertension   . Diabetes mellitus   . Symptomatic bradycardia     s/p pacemaker  . CHF (congestive heart failure)   . Dysrhythmia   . Shortness of breath   . Pacemaker   . Anginal pain     PER PATIENT  . Pulmonary hypertension 04/15/2013  . HTN (hypertension) 08/06/2013  . Protein-calorie malnutrition, severe 08/06/2013  . Ex-cigarette smoker 08/08/2013   Past Surgical History  Procedure Laterality Date  . Pacemaker insertion  06/16/2010    Biotronik devise at [facility]  . Neck surgery    . Joint replacement      bilateral hip  . Cholecystectomy     Family History  Problem Relation Age of Onset  . Heart failure Mother    History  Substance Use Topics  . Smoking status: Former Smoker    Types: Cigarettes    Quit date: 08/08/1972  . Smokeless tobacco: Former Neurosurgeon    Types: Snuff, Chew  . Alcohol Use: No    Review of Systems  Constitutional: Positive for fatigue. Negative for fever and chills.  Respiratory: Positive for cough and shortness of breath.  Negative for wheezing.   Cardiovascular: Negative for chest pain, palpitations and leg swelling.  Gastrointestinal: Negative for nausea, vomiting, abdominal pain, diarrhea and constipation.  Genitourinary: Positive for frequency. Negative for dysuria and hematuria.  Musculoskeletal: Negative for back pain, neck pain and neck stiffness.  Skin: Positive for wound. Negative for rash.  Neurological: Positive for weakness (generalized). Negative for dizziness, light-headedness, numbness and headaches.  All other systems reviewed and are negative.      Allergies  Review of patient's allergies indicates no known allergies.  Home Medications   Current Outpatient Rx  Name  Route  Sig  Dispense  Refill  . aspirin EC 81 MG EC tablet   Oral   Take 1 tablet (81 mg total) by mouth daily.         . carvedilol (COREG) 3.125 MG tablet   Oral   Take 1 tablet (3.125 mg total) by mouth 2 (two) times daily with a meal.   60 tablet   5   . furosemide (LASIX) 40 MG tablet   Oral   Take 1 tablet (40 mg total) by mouth 2 (two) times daily.   60 tablet   4   . glipiZIDE (GLUCOTROL) 5 MG tablet   Oral   Take 0.5 tablets (2.5 mg total) by mouth daily before breakfast.   30 tablet   2   . lisinopril (PRINIVIL,ZESTRIL) 20 MG tablet   Oral   Take  20 mg by mouth daily.          Marland Kitchen oxyCODONE (OXY IR/ROXICODONE) 5 MG immediate release tablet   Oral   Take 1 tablet (5 mg total) by mouth every 4 (four) hours as needed for moderate pain.   30 tablet   0    BP 131/55  Pulse 63  Temp(Src) 97.8 F (36.6 C) (Oral)  Resp 20  SpO2 100% Physical Exam  Nursing note and vitals reviewed. Constitutional: He is oriented to person, place, and time. He appears well-developed and well-nourished. No distress.  HENT:  Head: Normocephalic and atraumatic.  Mouth/Throat: Oropharynx is clear and moist. No oropharyngeal exudate.  Eyes: EOM are normal. Pupils are equal, round, and reactive to light.  Neck:  Normal range of motion. Neck supple.  Cardiovascular: Normal rate and regular rhythm.   Pulmonary/Chest: Effort normal and breath sounds normal. No respiratory distress. He has no wheezes. He has no rales.  Abdominal: Soft. Bowel sounds are normal. He exhibits no distension and no mass. There is no tenderness. There is no rebound and no guarding.  Musculoskeletal: Normal range of motion. He exhibits no edema and no tenderness.  No calf swelling or tenderness. Patient with stage I bilateral sacral ulcerations. There is no evidence of infection. Patient admitted bruising to posterior bilateral hills. No skin breakdown.  Neurological: He is alert and oriented to person, place, and time.  Speech is difficult to understand. 4/5 motor in all extremities. Sensation is grossly intact.  Skin: Skin is warm and dry. No rash noted. No erythema.  Psychiatric: He has a normal mood and affect. His behavior is normal.    ED Course  Angiocath insertion Date/Time: 09/19/2013 4:11 AM Performed by: Loren Racer Authorized by: Loren Racer Consent: Verbal consent obtained. Patient identity confirmed: verbally with patient Preparation: Patient was prepped and draped in the usual sterile fashion. Local anesthesia used: no Patient sedated: no Patient tolerance: Patient tolerated the procedure well with no immediate complications. Comments: Left external jugular placed with good return and ease of flushing. Line secured.    (including critical care time) Labs Review Labs Reviewed  CBC WITH DIFFERENTIAL  COMPREHENSIVE METABOLIC PANEL  TROPONIN I  URINALYSIS, ROUTINE W REFLEX MICROSCOPIC  PRO B NATRIURETIC PEPTIDE   Imaging Review Dg Chest 2 View (if Patient Has Fever And/or Copd)  09/11/2013   CLINICAL DATA:  Weakness, shortness of breath  EXAM: CHEST  2 VIEW  COMPARISON:  Prior radiograph from 08/12/2013  FINDINGS: Left-sided pacemaker is unchanged.  Cardiomegaly is stable.  Lungs are mildly  hyperinflated with attenuation of the pulmonary markings, suggesting COPD. Bilateral pleural effusions are present, left greater than right. There is dense retrocardiac left lower lobe opacity, which may reflect atelectasis or infiltrate. Scattered linear opacity within the right lung base most likely reflects atelectasis. Irregular biapical pleural thickening noted. No pneumothorax. No overt pulmonary edema.  No acute osseous abnormality. Severe degenerative changes noted at the [facility] joints and shoulders bilaterally.  IMPRESSION: 1. Left pleural effusion with associated dense retrocardiac left lower lobe opacity, which may reflect atelectasis or possibly infiltrate. 2. Smaller right pleural effusion with mild right basilar atelectasis. 3. Stable cardiomegaly without pulmonary edema. 4. COPD.   Electronically Signed   By: Rise Mu M.D.   On: 09/30/2013 22:39    EKG Interpretation   None       MDM   Final diagnoses:  None     Delay in getting antibiotics for suspected healthcare acquired  pneumonia do to difficulty obtaining IV access. Multiple attempts at ultrasound guided for lines were made. One was successful replacement and blue. I've placed a left external jugular IV and will begin antibiotics. Will discuss with hospitalist regarding admission. Patient remained hemodynamically stable in the emergency department. He is resting comfortably and in no distress.  Loren Raceravid Kyleigha Markert, MD 09/19/13 [phone]

## 2013-09-18 NOTE — ED Notes (Signed)
Bed: WA01 Expected date:  Expected time:  Means of arrival:  Comments: Decreased mobility

## 2013-09-19 ENCOUNTER — Inpatient Hospital Stay (HOSPITAL_COMMUNITY): Payer: Medicare Other

## 2013-09-19 DIAGNOSIS — J449 Chronic obstructive pulmonary disease, unspecified: Secondary | ICD-10-CM

## 2013-09-19 DIAGNOSIS — J90 Pleural effusion, not elsewhere classified: Secondary | ICD-10-CM | POA: Diagnosis present

## 2013-09-19 DIAGNOSIS — E43 Unspecified severe protein-calorie malnutrition: Secondary | ICD-10-CM

## 2013-09-19 DIAGNOSIS — Z7409 Other reduced mobility: Secondary | ICD-10-CM | POA: Diagnosis present

## 2013-09-19 DIAGNOSIS — R609 Edema, unspecified: Secondary | ICD-10-CM

## 2013-09-19 DIAGNOSIS — I10 Essential (primary) hypertension: Secondary | ICD-10-CM

## 2013-09-19 DIAGNOSIS — I428 Other cardiomyopathies: Secondary | ICD-10-CM

## 2013-09-19 DIAGNOSIS — Z950 Presence of cardiac pacemaker: Secondary | ICD-10-CM

## 2013-09-19 DIAGNOSIS — L8990 Pressure ulcer of unspecified site, unspecified stage: Secondary | ICD-10-CM | POA: Diagnosis present

## 2013-09-19 DIAGNOSIS — R5381 Other malaise: Secondary | ICD-10-CM

## 2013-09-19 DIAGNOSIS — E119 Type 2 diabetes mellitus without complications: Secondary | ICD-10-CM

## 2013-09-19 DIAGNOSIS — R69 Illness, unspecified: Secondary | ICD-10-CM

## 2013-09-19 DIAGNOSIS — N179 Acute kidney failure, unspecified: Secondary | ICD-10-CM

## 2013-09-19 DIAGNOSIS — J189 Pneumonia, unspecified organism: Secondary | ICD-10-CM | POA: Insufficient documentation

## 2013-09-19 DIAGNOSIS — D649 Anemia, unspecified: Secondary | ICD-10-CM

## 2013-09-19 DIAGNOSIS — R5383 Other fatigue: Secondary | ICD-10-CM

## 2013-09-19 LAB — COMPREHENSIVE METABOLIC PANEL
ALK PHOS: 91 U/L (ref 39–117)
ALT: 20 U/L (ref 0–53)
AST: 29 U/L (ref 0–37)
Albumin: 2.6 g/dL — ABNORMAL LOW (ref 3.5–5.2)
BILIRUBIN TOTAL: 1.1 mg/dL (ref 0.3–1.2)
BUN: 57 mg/dL — ABNORMAL HIGH (ref 6–23)
CALCIUM: 8.6 mg/dL (ref 8.4–10.5)
CO2: >45 mEq/L (ref 19–32)
Chloride: 93 mEq/L — ABNORMAL LOW (ref 96–112)
Creatinine, Ser: 1.41 mg/dL — ABNORMAL HIGH (ref 0.50–1.35)
GFR calc non Af Amer: 46 mL/min — ABNORMAL LOW (ref >90)
GFR, EST AFRICAN AMERICAN: 53 mL/min — AB (ref >90)
GLUCOSE: 219 mg/dL — AB (ref 70–99)
POTASSIUM: 3.4 meq/L — AB (ref 3.7–5.3)
SODIUM: 146 meq/L (ref 137–147)
Total Protein: 6 g/dL (ref 6.0–8.3)

## 2013-09-19 LAB — CBC WITH DIFFERENTIAL/PLATELET
BASOS ABS: 0 10*3/uL (ref 0.0–0.1)
Basophils Relative: 0 % (ref 0–1)
Eosinophils Absolute: 0 10*3/uL (ref 0.0–0.7)
Eosinophils Relative: 0 % (ref 0–5)
HEMATOCRIT: 26.5 % — AB (ref 39.0–52.0)
Hemoglobin: 7.9 g/dL — ABNORMAL LOW (ref 13.0–17.0)
LYMPHS ABS: 0.5 10*3/uL — AB (ref 0.7–4.0)
Lymphocytes Relative: 4 % — ABNORMAL LOW (ref 12–46)
MCH: 26.5 pg (ref 26.0–34.0)
MCHC: 29.8 g/dL — AB (ref 30.0–36.0)
MCV: 88.9 fL (ref 78.0–100.0)
MONOS PCT: 8 % (ref 3–12)
Monocytes Absolute: 1 10*3/uL (ref 0.1–1.0)
NEUTROS ABS: 11.3 10*3/uL — AB (ref 1.7–7.7)
Neutrophils Relative %: 88 % — ABNORMAL HIGH (ref 43–77)
Platelets: 138 10*3/uL — ABNORMAL LOW (ref 150–400)
RBC: 2.98 MIL/uL — ABNORMAL LOW (ref 4.22–5.81)
RDW: 21.1 % — AB (ref 11.5–15.5)
WBC Morphology: INCREASED
WBC: 12.8 10*3/uL — AB (ref 4.0–10.5)

## 2013-09-19 LAB — BLOOD GAS, ARTERIAL
ACID-BASE EXCESS: 19.8 mmol/L — AB (ref 0.0–2.0)
Acid-Base Excess: 20.1 mmol/L — ABNORMAL HIGH (ref 0.0–2.0)
BICARBONATE: 46.5 meq/L — AB (ref 20.0–24.0)
Bicarbonate: 46.1 mEq/L — ABNORMAL HIGH (ref 20.0–24.0)
Drawn by: 31276
O2 Content: 3 L/min
O2 Content: 2 L/min
O2 Saturation: 98.4 %
O2 Saturation: 99.4 %
PH ART: 7.454 — AB (ref 7.350–7.450)
PO2 ART: 108 mmHg — AB (ref 80.0–100.0)
Patient temperature: 98.6
Patient temperature: 98.6
TCO2: 44.1 mmol/L (ref 0–100)
TCO2: 43.8 mmol/L (ref 0–100)
pCO2 arterial: 67.2 mmHg (ref 35.0–45.0)
pCO2 arterial: 66.7 mmHg (ref 35.0–45.0)
pH, Arterial: 7.454 — ABNORMAL HIGH (ref 7.350–7.450)
pO2, Arterial: 127 mmHg — ABNORMAL HIGH (ref 80.0–100.0)

## 2013-09-19 LAB — URINALYSIS, ROUTINE W REFLEX MICROSCOPIC
BILIRUBIN URINE: NEGATIVE
Glucose, UA: NEGATIVE mg/dL
KETONES UR: NEGATIVE mg/dL
Nitrite: NEGATIVE
PROTEIN: 30 mg/dL — AB
Specific Gravity, Urine: 1.016 (ref 1.005–1.030)
UROBILINOGEN UA: 1 mg/dL (ref 0.0–1.0)
pH: 6 (ref 5.0–8.0)

## 2013-09-19 LAB — GLUCOSE, CAPILLARY
GLUCOSE-CAPILLARY: 68 mg/dL — AB (ref 70–99)
GLUCOSE-CAPILLARY: 70 mg/dL (ref 70–99)
Glucose-Capillary: 130 mg/dL — ABNORMAL HIGH (ref 70–99)
Glucose-Capillary: 104 mg/dL — ABNORMAL HIGH (ref 70–99)
Glucose-Capillary: 138 mg/dL — ABNORMAL HIGH (ref 70–99)

## 2013-09-19 LAB — URINE MICROSCOPIC-ADD ON

## 2013-09-19 LAB — AMMONIA: AMMONIA: 25 umol/L (ref 11–60)

## 2013-09-19 LAB — INFLUENZA PANEL BY PCR (TYPE A & B)
H1N1FLUPCR: NOT DETECTED
INFLBPCR: NEGATIVE
Influenza A By PCR: NEGATIVE

## 2013-09-19 LAB — PROCALCITONIN: PROCALCITONIN: 0.46 ng/mL

## 2013-09-19 LAB — PRO B NATRIURETIC PEPTIDE: Pro B Natriuretic peptide (BNP): >70000 pg/mL — ABNORMAL HIGH (ref 0–450)

## 2013-09-19 LAB — SODIUM, URINE, RANDOM: SODIUM UR: 10 meq/L

## 2013-09-19 LAB — MRSA PCR SCREENING: MRSA by PCR: NEGATIVE

## 2013-09-19 LAB — TROPONIN I: Troponin I: <0.3 ng/mL (ref <0.30)

## 2013-09-19 LAB — PROTIME-INR
INR: 1.09 (ref 0.00–1.49)
Prothrombin Time: 13.9 seconds (ref 11.6–15.2)

## 2013-09-19 LAB — LACTIC ACID, PLASMA: Lactic Acid, Venous: 2.1 mmol/L (ref 0.5–2.2)

## 2013-09-19 LAB — STREP PNEUMONIAE URINARY ANTIGEN: STREP PNEUMO URINARY ANTIGEN: NEGATIVE

## 2013-09-19 LAB — CREATININE, URINE, RANDOM: CREATININE, URINE: 59.8 mg/dL

## 2013-09-19 LAB — APTT: aPTT: 22 seconds — ABNORMAL LOW (ref 24–37)

## 2013-09-19 MED ORDER — INSULIN ASPART 100 UNIT/ML ~~LOC~~ SOLN
0.0000 [IU] | Freq: Three times a day (TID) | SUBCUTANEOUS | Status: DC
  Administered 2013-09-19 – 2013-09-20 (×2): 1 [IU] via SUBCUTANEOUS
  Administered 2013-09-20: 2 [IU] via SUBCUTANEOUS
  Administered 2013-09-20: 5 [IU] via SUBCUTANEOUS
  Administered 2013-09-21 (×2): 2 [IU] via SUBCUTANEOUS

## 2013-09-19 MED ORDER — VANCOMYCIN HCL IN DEXTROSE 1-5 GM/200ML-% IV SOLN
1000.0000 mg | Freq: Once | INTRAVENOUS | Status: AC
  Administered 2013-09-19: 1000 mg via INTRAVENOUS
  Filled 2013-09-19: qty 200

## 2013-09-19 MED ORDER — SODIUM CHLORIDE 0.9 % IJ SOLN
10.0000 mL | Freq: Two times a day (BID) | INTRAMUSCULAR | Status: DC
  Administered 2013-09-19: 30 mL
  Administered 2013-09-20 – 2013-09-22 (×4): 10 mL
  Administered 2013-09-25: 20 mL
  Administered 2013-09-25 – 2013-10-05 (×4): 10 mL

## 2013-09-19 MED ORDER — FLUCONAZOLE 100MG IVPB: 100.0000 mg | Freq: Two times a day (BID) | INTRAVENOUS | Status: DC

## 2013-09-19 MED ORDER — SACCHAROMYCES BOULARDII 250 MG PO CAPS
250.0000 mg | ORAL_CAPSULE | Freq: Two times a day (BID) | ORAL | Status: DC
  Administered 2013-09-19 – 2013-10-01 (×21): 250 mg via ORAL
  Filled 2013-09-19 (×33): qty 1

## 2013-09-19 MED ORDER — ACETAMINOPHEN 325 MG PO TABS
650.0000 mg | ORAL_TABLET | Freq: Four times a day (QID) | ORAL | Status: DC | PRN
Start: 2013-09-19 — End: 2013-10-04
  Administered 2013-09-29: 650 mg via ORAL
  Filled 2013-09-19: qty 2

## 2013-09-19 MED ORDER — SODIUM CHLORIDE 0.9 % IJ SOLN
10.0000 mL | INTRAMUSCULAR | Status: DC | PRN
  Administered 2013-09-22: 20 mL
  Administered 2013-09-23 (×2): 10 mL
  Administered 2013-09-23 – 2013-09-24 (×2): 20 mL
  Administered 2013-09-24 (×2): 10 mL
  Administered 2013-09-25 – 2013-09-27 (×3): 30 mL
  Administered 2013-09-28: 20 mL
  Administered 2013-09-29 – 2013-10-01 (×5): 10 mL
  Administered 2013-10-02: 20 mL
  Administered 2013-10-02 (×2): 10 mL
  Administered 2013-10-03: 20 mL
  Administered 2013-10-03: 10 mL
  Administered 2013-10-04 – 2013-10-05 (×4): 20 mL
  Administered 2013-10-05: 10 mL
  Administered 2013-10-06: 30 mL
  Administered 2013-10-07: 10 mL

## 2013-09-19 MED ORDER — SODIUM CHLORIDE 0.9 % IV SOLN: INTRAVENOUS | Status: DC

## 2013-09-19 MED ORDER — TRAMADOL HCL 50 MG PO TABS
50.0000 mg | ORAL_TABLET | Freq: Four times a day (QID) | ORAL | Status: DC | PRN
  Administered 2013-09-20 – 2013-09-24 (×3): 50 mg via ORAL
  Filled 2013-09-19 (×3): qty 1

## 2013-09-19 MED ORDER — ENSURE COMPLETE PO LIQD
237.0000 mL | Freq: Two times a day (BID) | ORAL | Status: DC
  Administered 2013-09-20 – 2013-09-30 (×16): 237 mL via ORAL

## 2013-09-19 MED ORDER — PIPERACILLIN-TAZOBACTAM 3.375 G IVPB 30 MIN
3.3750 g | Freq: Once | INTRAVENOUS | Status: AC
  Administered 2013-09-19: 3.375 g via INTRAVENOUS
  Filled 2013-09-19: qty 50

## 2013-09-19 MED ORDER — INSULIN ASPART 100 UNIT/ML ~~LOC~~ SOLN: 0.0000 [IU] | Freq: Every day | SUBCUTANEOUS | Status: DC

## 2013-09-19 MED ORDER — VANCOMYCIN HCL IN DEXTROSE 1-5 GM/200ML-% IV SOLN
1000.0000 mg | INTRAVENOUS | Status: DC
  Administered 2013-09-20: 1000 mg via INTRAVENOUS
  Filled 2013-09-19: qty 200

## 2013-09-19 MED ORDER — BISACODYL 10 MG RE SUPP: 10.0000 mg | Freq: Every day | RECTAL | Status: DC | PRN

## 2013-09-19 MED ORDER — DOCUSATE SODIUM 283 MG RE ENEM
1.0000 | ENEMA | Freq: Every day | RECTAL | Status: DC | PRN
  Filled 2013-09-19: qty 1

## 2013-09-19 MED ORDER — PANTOPRAZOLE SODIUM 40 MG IV SOLR
40.0000 mg | Freq: Two times a day (BID) | INTRAVENOUS | Status: DC
  Administered 2013-09-19 – 2013-09-21 (×5): 40 mg via INTRAVENOUS
  Filled 2013-09-19 (×6): qty 40

## 2013-09-19 MED ORDER — SODIUM CHLORIDE 0.9 % IV SOLN
INTRAVENOUS | Status: AC
  Administered 2013-09-19: 09:00:00 via INTRAVENOUS

## 2013-09-19 MED ORDER — SODIUM CHLORIDE 0.9 % IV SOLN
INTRAVENOUS | Status: DC
  Administered 2013-09-19: 04:00:00 via INTRAVENOUS

## 2013-09-19 MED ORDER — DEXTROSE 5 % IV SOLN
1.0000 g | INTRAVENOUS | Status: DC
  Administered 2013-09-19: 1 g via INTRAVENOUS
  Filled 2013-09-19 (×3): qty 1

## 2013-09-19 MED ORDER — IOHEXOL 350 MG/ML SOLN
80.0000 mL | Freq: Once | INTRAVENOUS | Status: AC | PRN
  Administered 2013-09-19: 80 mL via INTRAVENOUS

## 2013-09-19 MED ORDER — DEXTROSE 5 % IV SOLN: 1.0000 g | Freq: Three times a day (TID) | INTRAVENOUS | Status: DC

## 2013-09-19 NOTE — Progress Notes (Signed)
Post void residual showed 277 ml

## 2013-09-19 NOTE — Progress Notes (Addendum)
[facility] PROGRESS NOTE  Frederick [name] ZOX:[id number] DOB: 04-08-1932 DOA: 10/01/2013 PCP: Arvella Nigh, MD  Assessment/Plan  Acute hypoxic respiratory failure due to HCAP (healthcare-associated pneumonia) with dense retrocardiac shadow along with bilateral pleural effusion, atelectasis vs. Infiltrate.  CTa negative for PE.   -  ABG stable -  F/u BCx -  Flu neg -  S. pneumo neg -  Legionella pending -  Continue IV vancomycin and IV cefepime -  BiPAP when necessary  -  IS and flutter valve when more alert, consider bipap in meantime to combat atelectasis   Lethargy and confusion, may be related to infection or heart failure -  ABG with mild hypercapnea, however, pH elevated.  Suspect chronic CO2 retention -  Ammonia -  Continue tx for HCAP -  No evidence of seizures  Severe ICM EF 15-20% s/p pacemaker and recent admission for heart failure, discharge weight of 126-lbs and currently 127lbs.  Could have lost muscle/fat weight and still be volume overloaded, however. Has pronounced JVP and effusions on CXR, however, no overt pulmonary edema.  proBNP > 70K -  Currently 127-lbs -  Judicious IVF  -  Please consult cardiology in the AM -  Repeat CXR in AM  AKI, could be secondary to dehydration -- has recently been diuresed and has been eating and drinking only a few sips of liquid from a teaspoon the last few days.  However, he was hospitalized with cardiorenal syndrome which improved with diuresis.   -  FENa  -  IVF x 8 hours -  Repeat creatinine in AM, if stable, higher, or only minimally improved, would assume that his AKI is due to heart failure and start diuresis tomorrow.  Blood leaking from urethra likely due to I/O cath trauma.   -  Spoke with urology Dr. Jolyne Loa regarding management who recommended avoiding cath unless he has evidence of obstruction.  If obstructed, place coude catheter and leave in for approximately 2 days.   -  Nurse to monitor uop and obtain PVR if  uop decreases.  If increasing residuals, notify MD for orders  Hypertension, at risk for hypotension -  Holding antihypertensive medications at present   T2DM, CBG stable  -  Continue low dose SSI -  Hold oral meds   Bilateral hip and knee pain, worse than baseline -  X-rays of hip and knee:  No fracture  Left arm basilic vein thrombus -  Wife declines a/c b/c she feels it caused the large bruise of his right side -  Continue to readdress b/c picc line will increase risk of DVT furhter  Deconditioning due to heart failure, recurrent infections, severe protein calorie malnutrition -  PT/OT assessments -  Discussed palliative care consultation with wife.  She is aware that he has limited prognosis, however, she is not interested in goals of care conversation or hospice care at this time.  Despite reassurances, she implied that her impression was that hospice kills people by giving them too much sedating medication.  Will continue to address.    Multiple pressure ulcers, feet, sacrum, back -  Wound care consult -  Air mattress -  booties  Severe protein calorie malnutrition -  Supplements, liberalized diet  Leukocytosis, likely related to infection -  Trend WBC -  Continue abx  Normocytic anemia, recently received blood transfusion of 2 units from [place], unclear cause of anemia -  Trend hgb -  tx for hgb < 7  Thrombocytopenia, acute phase reactant  Hypokalemia due to malnutrition, replete with oral potassium  Diet:  Dysphagia 1 Access:  PICC IVF:  yes Proph:  lovenox  Code Status: DNR Family Communication: spoke with patient and wife Disposition Plan:  Pending improvement in mentation, breathing.  Eligible for hospice, however, family declining.  Will likely need SNF at discharge.    Consultants:  None  Procedures:  CXR  CT anio  XR knee right  XR bilateral hips  Antibiotics:  Vancomycin 2/15  Zosyn x 1 2/15  Cefepime 2/15 >>    HPI/Subjective:  Difficult historian due to somnolence.  Denies current pain, SOB, nausea, vomiting, diarrhea.   Objective: Filed Vitals:   09/19/13 0300 09/19/13 0500 09/19/13 0504 09/19/13 0600  BP: 125/56 131/47  143/84  Pulse: 85     Temp: 97.3 F (36.3 C)     TempSrc: Axillary     Resp:      Height:   5\' 6"  (1.676 m) 5\' 7"  (1.702 m)  Weight:   61.5 kg (135 lb 9.3 oz) 57.7 kg (127 lb 3.3 oz)  SpO2: 95%      No intake or output data in the 24 hours ending 09/19/13 1343 Filed Weights   09/19/13 0504 09/19/13 0600  Weight: 61.5 kg (135 lb 9.3 oz) 57.7 kg (127 lb 3.3 oz)    Exam:   General:  Cachectic BM, No acute distress  HEENT:  NCAT, MMM, JVP to preauricular area  Cardiovascular:  Distance IRRR, nl S1, S2, + gallop, 1+ pulses, cool extremities  Respiratory:   Diminished bilateral BS, no wheezes or rhonchi, no increased WOB  Abdomen:   NABS, soft, NT/ND  MSK:   Decreased tone and bulk, no LEE, left arm with 1+ nonpitting edema   Neuro:  Grossly intact  Skin:  Feet with bilateral blackened unstageable ulcers on bilateral heels.  Hand-sized stage III/IV sacral decub ulcer with some central necrosis and foul odor.  Purpura which extends down the entire right axilla to the spine and down to the sacrum   Data Reviewed: Basic Metabolic Panel:  Recent Labs Lab 09/19/13 0100  NA 146  K 3.4*  CL 93*  CO2 >45*  GLUCOSE 219*  BUN 57*  CREATININE 1.41*  CALCIUM 8.6   Liver Function Tests:  Recent Labs Lab 09/19/13 0100  AST 29  ALT 20  ALKPHOS 91  BILITOT 1.1  PROT 6.0  ALBUMIN 2.6*   No results found for this basename: LIPASE, AMYLASE,  in the last 168 hours No results found for this basename: AMMONIA,  in the last 168 hours CBC:  Recent Labs Lab 09/19/13 0100  WBC 12.8*  NEUTROABS 11.3*  HGB 7.9*  HCT 26.5*  MCV 88.9  PLT 138*   Cardiac Enzymes:  Recent Labs Lab 09/19/13 0100  TROPONINI <0.30   BNP (last 3 results)  Recent  Labs  08/06/13 1121 08/12/13 1649 09/19/13 0100  PROBNP >70000.0* 61891.0* >70000.0*   CBG:  Recent Labs Lab 09/19/13 0817  GLUCAP 130*    Recent Results (from the past 240 hour(s))  MRSA PCR SCREENING     Status: None   Collection Time    09/19/13  6:37 AM      Result Value Ref Range Status   MRSA by PCR NEGATIVE  NEGATIVE Final   Comment:            The GeneXpert MRSA Assay (FDA     approved for NASAL specimens     only), is one component  of a     comprehensive MRSA colonization     surveillance program. It is not     intended to diagnose MRSA     infection nor to guide or     monitor treatment for     MRSA infections.     Studies: Dg Chest 2 View (if Patient Has Fever And/or Copd)  09/15/2013   CLINICAL DATA:  Weakness, shortness of breath  EXAM: CHEST  2 VIEW  COMPARISON:  Prior radiograph from 08/12/2013  FINDINGS: Left-sided pacemaker is unchanged.  Cardiomegaly is stable.  Lungs are mildly hyperinflated with attenuation of the pulmonary markings, suggesting COPD. Bilateral pleural effusions are present, left greater than right. There is dense retrocardiac left lower lobe opacity, which may reflect atelectasis or infiltrate. Scattered linear opacity within the right lung base most likely reflects atelectasis. Irregular biapical pleural thickening noted. No pneumothorax. No overt pulmonary edema.  No acute osseous abnormality. Severe [facility] and shoulders bilaterally.  IMPRESSION: 1. Left pleural effusion with associated dense retrocardiac left lower lobe opacity, which may reflect atelectasis or possibly infiltrate. 2. Smaller right pleural effusion with mild right basilar atelectasis. 3. Stable cardiomegaly without pulmonary edema. 4. COPD.   Electronically Signed   By: Rise MuBenjamin  McClintock M.D.   On: 09/22/2013 22:39   Dg Hip Bilateral W/pelvis  09/19/2013   CLINICAL DATA:  Bilateral hip pain and right knee pain.  EXAM: BILATERAL HIP  WITH PELVIS - 4+ VIEW  COMPARISON:  No priors.  FINDINGS: AP view of the pelvis and AP and lateral views of the hips bilaterally demonstrate postoperative changes of bilateral total hip arthroplasty. The femoral and acetabular components of both of the prostheses appear to be properly seated without definite periprosthetic fracture. Bilateral femoral heads are located. Cerclage wire surrounding the trochanteric region of the right proximal femur. Bony pelvis appears intact. Bones are mildly osteopenic.  IMPRESSION: Status post bilateral total hip arthroplasty without definite periprosthetic fracture or other acute complicating features.   Electronically Signed   By: Trudie Reedaniel  Entrikin M.D.   On: 09/19/2013 10:47   Dg Knee 1-2 Views Right  09/19/2013   CLINICAL DATA:  Right knee pain.  EXAM: RIGHT KNEE - 1-2 VIEW  COMPARISON:  No priors.  FINDINGS: There is no evidence of fracture, dislocation, or joint effusion. There is no evidence of arthropathy or other focal bone abnormality. Soft tissues are unremarkable.  IMPRESSION: Negative.   Electronically Signed   By: Trudie Reedaniel  Entrikin M.D.   On: 09/19/2013 10:48   Ct Angio Chest Pe W/cm &/or Wo Cm  09/19/2013   CLINICAL DATA:  80 year old male with shortness of breath, pleural effusions. Pain. Initial encounter.  EXAM: CT ANGIOGRAPHY CHEST WITH CONTRAST  TECHNIQUE: Multidetector CT imaging of the chest was performed using the standard protocol during bolus administration of intravenous contrast. Multiplanar CT image reconstructions and MIPs were obtained to evaluate the vascular anatomy.  CONTRAST:  80mL OMNIPAQUE IOHEXOL 350 MG/ML SOLN  COMPARISON:  Chest CTA 05/25/2011.  FINDINGS: Good contrast bolus timing in the pulmonary arterial tree. Some mixing artifact is noted in the distal left main pulmonary artery. This does not appear confluent or suspicious for embolus. Loss of lower lobe pulmonary artery opacification occurs in a gradient like fashion. No abrupt or  suspicious pulmonary artery filling defect identified.  Large layering bilateral pleural effusions. Simple fluid densitometry compatible with transudate. No abnormal pleural thickening or enhancement.  Cardiomegaly. No pericardial effusion. Left chest  cardiac pacemaker device. No mediastinal lymphadenopathy. Calcified atherosclerosis of the aorta and coronary arteries.  Stable and negative visualized upper abdominal viscera, with additional calcified atherosclerosis.  Major airways are patent. There is left worse than right compressive lower lobe collapse related to the effusions. There is superimposed dependent upper lobe atelectasis. No evidence of superimposed pneumonia at this time. Some chronic architectural distortion in the anterior right upper lobe is stable.  Stable visualized osseous structures.  Review of the MIP images confirms the above findings.  IMPRESSION: 1. No strong evidence of acute pulmonary embolus. 2. Large layering and simple appearing bilateral pleural effusions with near complete lower lobe collapse, greater on the left, and upper lobe compressive atelectasis.   Electronically Signed   By: Augusto Gamble M.D.   On: 09/19/2013 06:12    Scheduled Meds: . ceFEPime (MAXIPIME) IV  1 g Intravenous Q24H  . feeding supplement (ENSURE COMPLETE)  237 mL Oral BID BM  . insulin aspart  0-5 Units Subcutaneous QHS  . insulin aspart  0-9 Units Subcutaneous TID WC  . pantoprazole (PROTONIX) IV  40 mg Intravenous BID  . [START ON 09/20/2013] vancomycin  1,000 mg Intravenous Q24H   Continuous Infusions: . 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 If 7PM-7AM, please contact night-coverage at [email or web address], password [facility] 09/19/2013, 1:43 PM  LOS: 1 day

## 2013-09-19 NOTE — H&P (Signed)
[facility] History and Physical  Patient: Frederick [name]  ZOX:[id number]  DOB: 03/15/1932  DOS: the patient was seen and examined on 09/19/2013 PCP: Arvella NighADCOCK, JIMMIE, MD  Chief Complaint: Weakness and hip pain  HPI: Frederick [name] is a 80 y.o. male with Past medical history of hypertension, diabetes mellitus, pacemaker insertion for bradycardia, CHF. The patient is coming from home. The patient appears poor historian therefore the history has been obtained from ED physician. As per the ED physician's documentation the patient was recently admitted to the hospital for possible pneumonia and after the discharge she has been unable to take care of himself. Patient lives with his wife and patient takes care of his wife. Since last few days patient has been having complaints of bilateral hip pain and has been able to walk secondary to that and therefore has been bed bound. He also has some generalized weakness. Patient has developed bilateral sacral ulcers and wounds to bilateral heels and therefore they brought him to the hospital. Patient at his baseline uses 1-2 L of oxygen as needed but at present has been more short of breath and requiring 2-3 L of oxygen to remain better. He has cough which has not changed since last few months.  There was no fever no chills patient denied any complaints of any abdominal pain chest pain headache focal neurological deficit diarrhea vomiting or dysuria. His only complaint was bilateral hip pain.  Review of Systems: as mentioned in the history of present illness.  A Comprehensive review of the other systems is negative.  Past Medical History  Diagnosis Date  . Hypertension   . Diabetes mellitus   . Symptomatic bradycardia     s/p pacemaker  . CHF (congestive heart failure)   . Dysrhythmia   . Shortness of breath   . Pacemaker   . Anginal pain     PER PATIENT  . Pulmonary hypertension 04/15/2013  . HTN (hypertension) 08/06/2013  . Protein-calorie  malnutrition, severe 08/06/2013  . Ex-cigarette smoker 08/08/2013   Past Surgical History  Procedure Laterality Date  . Pacemaker insertion  06/16/2010    Biotronik devise at [facility]  . Neck surgery    . Joint replacement      bilateral hip  . Cholecystectomy     Social History:  reports that he quit smoking about 41 years ago. His smoking use included Cigarettes. He smoked 0.00 packs per day. He has quit using smokeless tobacco. His smokeless tobacco use included Snuff and Chew. He reports that he does not drink alcohol or use illicit drugs.  No Known Allergies  Family History  Problem Relation Age of Onset  . Heart failure Mother     Prior to Admission medications   Medication Sig Start Date End Date Taking? Authorizing Provider  aspirin EC 81 MG EC tablet Take 1 tablet (81 mg total) by mouth daily. 04/21/13  Yes Brittainy Sharol HarnessSimmons, PA-C  carvedilol (COREG) 3.125 MG tablet Take 1 tablet (3.125 mg total) by mouth 2 (two) times daily with a meal. 04/21/13  Yes Brittainy Simmons, PA-C  furosemide (LASIX) 40 MG tablet Take 1 tablet (40 mg total) by mouth 2 (two) times daily. 08/10/13  Yes Esperanza SheetsUlugbek N Buriev, MD  glipiZIDE (GLUCOTROL) 5 MG tablet Take 0.5 tablets (2.5 mg total) by mouth daily before breakfast. 08/10/13  Yes Esperanza SheetsUlugbek N Buriev, MD  lisinopril (PRINIVIL,ZESTRIL) 20 MG tablet Take 20 mg by mouth daily.    Yes Historical Provider, MD  oxyCODONE (OXY IR/ROXICODONE) 5 MG  immediate release tablet Take 1 tablet (5 mg total) by mouth every 4 (four) hours as needed for moderate pain. 08/17/13  Yes Dorothea Ogle, MD    Physical Exam: Filed Vitals:   09/14/2013 2145 09/19/13 0300 09/19/13 0504  BP: 131/55 125/56   Pulse: 63 85   Temp: 97.8 F (36.6 C) 97.3 F (36.3 C)   TempSrc: Oral Axillary   Resp: 20    Height:   5\' 6"  (1.676 m)  Weight:   61.5 kg (135 lb 9.3 oz)  SpO2: 100% 95%     General: Alert, Awake and Oriented to Time, Place and Person. Appear in marked distress Eyes:  PERRL ENT: Oral Mucosa, dark Dalto exudate,dry. Neck: no JVD Cardiovascular: S1 and S2 Present, no Murmur, Peripheral Pulses Present Respiratory: Bilateral Air entry equal and Decreased, bilateral Crackles,no wheezes Abdomen: Bowel Sound Present, Soft and Non tender Skin: no Rash Extremities: Trace Pedal edema,  No pain hip on external rotation bilaterally. Pain in knee on the left with movement. Neurologic: Grossly Unremarkable. Appears lethargic. Bilateral equal strength, withdraws to pain, Babinski negative.  Labs on Admission:  CBC:  Recent Labs Lab 09/19/13 0100  WBC 12.8*  NEUTROABS 11.3*  HGB 7.9*  HCT 26.5*  MCV 88.9  PLT 138*    CMP     Component Value Date/Time   NA 146 09/19/2013 0100   K 3.4* 09/19/2013 0100   CL 93* 09/19/2013 0100   CO2 >45* 09/19/2013 0100   GLUCOSE 219* 09/19/2013 0100   BUN 57* 09/19/2013 0100   CREATININE 1.41* 09/19/2013 0100   CALCIUM 8.6 09/19/2013 0100   PROT 6.0 09/19/2013 0100   ALBUMIN 2.6* 09/19/2013 0100   AST 29 09/19/2013 0100   ALT 20 09/19/2013 0100   ALKPHOS 91 09/19/2013 0100   BILITOT 1.1 09/19/2013 0100   GFRNONAA 46* 09/19/2013 0100   GFRAA 53* 09/19/2013 0100    No results found for this basename: LIPASE, AMYLASE,  in the last 168 hours No results found for this basename: AMMONIA,  in the last 168 hours   Recent Labs Lab 09/19/13 0100  TROPONINI <0.30   BNP (last 3 results)  Recent Labs  08/06/13 1121 08/12/13 1649 09/19/13 0100  PROBNP >70000.0* 16109.6* >70000.0*    Radiological Exams on Admission: Dg Chest 2 View (if Patient Has Fever And/or Copd)  09/17/2013   CLINICAL DATA:  Weakness, shortness of breath  EXAM: CHEST  2 VIEW  COMPARISON:  Prior radiograph from 08/12/2013  FINDINGS: Left-sided pacemaker is unchanged.  Cardiomegaly is stable.  Lungs are mildly hyperinflated with attenuation of the pulmonary markings, suggesting COPD. Bilateral pleural effusions are present, left greater than right. There is  dense retrocardiac left lower lobe opacity, which may reflect atelectasis or infiltrate. Scattered linear opacity within the right lung base most likely reflects atelectasis. Irregular biapical pleural thickening noted. No pneumothorax. No overt pulmonary edema.  No acute osseous abnormality. Severe degenerative changes noted at the [facility] joints and shoulders bilaterally.  IMPRESSION: 1. Left pleural effusion with associated dense retrocardiac left lower lobe opacity, which may reflect atelectasis or possibly infiltrate. 2. Smaller right pleural effusion with mild right basilar atelectasis. 3. Stable cardiomegaly without pulmonary edema. 4. COPD.   Electronically Signed   By: Rise Mu M.D.   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 HCAP (healthcare-associated pneumonia) The patient is presenting with generalized weakness and bilateral hip pain. He was hypoxic on arrival and tachycardic and tachypneic. With this his chest x-ray is showing dense retrocardiac shadow along with bilateral pleural effusion. This is highly suggestive of recurrence of pneumonia versus due to his recurrent pneumonia possible empyema. E. link is consulted to monitor the patient His blood gases showing hypercarbia and makes respiratory and acidosis and alkalosis. Blood cultures ordered sputum cultures urine antigens influenza PCR. Broad-spectrum antibiotic with IV vancomycin and IV cefepime. BiPAP when necessary Duo nebs, incentive spirometry.  2. Hypertension Holding antihypertensive  medications at present  3. Diabetes mellitus Placing on sliding scale, holding other medications  4. Bilateral hip and knee pain X-rays of hip and knee His immobilization and recurrent admission approximate high risk for possible PE along with his hypoxia. Also there is a possibility of empyema and aspiration. Therefore I will obtain a CT with contrast.  Consults: e link  DVT Prophylaxis: subcutaneous Heparin Nutrition: npo  Code Status: DNR/DNI based on prior status  Family Communication:  Grand daughter Stanford [place] [phone] Spouse Bonita Quin [phone] Michele Mcalpine [phone] I attempted to call all given numbers including one in the demographic, but it was either not available or went to voicemail, left a message to call back.  Disposition: Admitted to inpatient in step-down unit.  Author: Lynden Oxford, MD [facility] Pager: [phone] 09/19/2013, 5:04 AM    If 7PM-7AM, please contact night-coverage [email or web address] Password [email or web address]

## 2013-09-19 NOTE — Consult Note (Signed)
WOC wound consult note Reason for Consult:Bilateral heel pressure ulcers (Right: Unstageable, left: Stage III and Sacrum: sDTI) and sacral ulcer Wound type:Pressure Pressure Ulcer POA: Yes Measurement:Left heel:  Stable black eschar measuring 3.5cm x 3.5cm round.  Right heel:  Healing Stage III pressure Ulcer with resolving ecchymosis and minimal depth.  Measurements are 8cm x 5cm x 0.2cm in center.  Sacral:  9cm x 10cm area of sDTI with superficial tissue loss at periphery and eschar obscuring wound bed in center. Wound bed:As described above. Drainage (amount, consistency, odor) Scant serosanguinous drainage from sacrum, no odor.  No drainage or odor from heels. Periwound:intact, dry. Dressing procedure/placement/frequency: Bilateral presure redistribution heel boots are in place.  I have added a silicone dressing to the left and painting the right with a betadine swabstick twice daily to dry and protect the stable eschar.  The sDTI on the sacrum is protected by the soft silicone foam dressing and the patient is currently on a therapeutic sleep surface with low air loss feature. I recommend turning and repositioning off of the sacrum at all times except for meals and despite his current status, I have requested a nutritional consult to maximize his potential for tissue repair. WOC nursing team will not follow, but will remain available to this patient, the nursing and medical teams.  Please re-consult if needed. Thanks, Ladona Mow, MSN, RN, GNP, [place], [facility]ON-AP [phone])

## 2013-09-19 NOTE — ED Notes (Signed)
CO2 greater than 45, critical lab relayed to K. Ward, RN.

## 2013-09-19 NOTE — ED Notes (Signed)
Bed: WA24 Expected date:  Expected time:  Means of arrival:  Comments: Train wrecks go in Res rooms

## 2013-09-19 NOTE — Progress Notes (Signed)
ANTIBIOTIC CONSULT NOTE - INITIAL  Pharmacy Consult for Vancomycin & Renally dose adjusted antibiotics Indication: Pneumonia  No Known Allergies  Patient Measurements: Height: 5\' 6"  (167.6 cm) Weight: 135 lb 9.3 oz (61.5 kg) IBW/kg (Calculated) : 63.8  Vital Signs: Temp: 97.3 F (36.3 C) (02/15 0300) Temp src: Axillary (02/15 0300) BP: 125/56 mmHg (02/15 0300) Pulse Rate: 85 (02/15 0300) Intake/Output from previous day:   Intake/Output from this shift:    Labs:  Recent Labs  09/19/13 0100  WBC 12.8*  HGB 7.9*  PLT 138*  CREATININE 1.41*   Estimated Creatinine Clearance: 37 ml/min (by C-G formula based on Cr of 1.41). No results found for this basename: VANCOTROUGH, VANCOPEAK, VANCORANDOM, GENTTROUGH, GENTPEAK, GENTRANDOM, TOBRATROUGH, TOBRAPEAK, TOBRARND, AMIKACINPEAK, AMIKACINTROU, AMIKACIN,  in the last 72 hours   Microbiology: No results found for this or any previous visit (from the past 720 hour(s)).  Medical History: Past Medical History  Diagnosis Date  . Hypertension   . Diabetes mellitus   . Symptomatic bradycardia     s/p pacemaker  . CHF (congestive heart failure)   . Dysrhythmia   . Shortness of breath   . Pacemaker   . Anginal pain     PER PATIENT  . Pulmonary hypertension 04/15/2013  . HTN (hypertension) 08/06/2013  . Protein-calorie malnutrition, severe 08/06/2013  . Ex-cigarette smoker 08/08/2013    Medications:  Scheduled:   Infusions:  . sodium chloride 75 mL/hr at 09/19/13 0429  . ceFEPime (MAXIPIME) IV    . vancomycin 1,000 mg (09/19/13 0428)   Assessment:  80 yr male with weakness and shortness of breath.  Noted sacral ulcers and wounds to both heels.    CT shows possible infiltrate  Received Vancomycin 1gm IV x 1 in ED @ 04:28 and Zosyn 3.375gm IV x 1 in ED @ 0350  Upon admission, Vancomycin per pharmacy ordered as well as Cefepime 1gm IV q8h (pharmacy consulted to adjust dosages of antibiotics based on renal function)  CrCl  ~ 37 ml/min  Blood and sputum cultures ordered  Goal of Therapy:  Vancomycin trough level 15-20 mcg/ml  Plan:  Measure antibiotic drug levels at steady state Follow up culture results Vancomycin 1gm IV q24h Change Cefepime to 1gm IV q24h  Rashan Patient, Joselyn Glassman, PharmD 09/19/2013,5:10 AM

## 2013-09-19 NOTE — ED Notes (Addendum)
Attempted to collect UA via I&O x 2 each time meeting resistance, frank red blood noted.  3 RN's attempted IV, once IV was established it infiltrated as soon as NS began to infuse.  IV team paged.  Will notify Dr. Ranae Palms as pt is to receive vanc and zosyn.    [phone]  Dr. Ranae Palms notified.

## 2013-09-20 ENCOUNTER — Inpatient Hospital Stay (HOSPITAL_COMMUNITY): Payer: Medicare Other

## 2013-09-20 ENCOUNTER — Encounter (HOSPITAL_COMMUNITY): Payer: Self-pay

## 2013-09-20 DIAGNOSIS — E162 Hypoglycemia, unspecified: Secondary | ICD-10-CM

## 2013-09-20 LAB — CBC
HCT: 21.3 % — ABNORMAL LOW (ref 39.0–52.0)
Hemoglobin: 6.6 g/dL — CL (ref 13.0–17.0)
MCH: 28 pg (ref 26.0–34.0)
MCHC: 31 g/dL (ref 30.0–36.0)
MCV: 90.3 fL (ref 78.0–100.0)
Platelets: 109 10*3/uL — ABNORMAL LOW (ref 150–400)
RBC: 2.36 MIL/uL — AB (ref 4.22–5.81)
RDW: 21.2 % — ABNORMAL HIGH (ref 11.5–15.5)
WBC: 11.8 10*3/uL — ABNORMAL HIGH (ref 4.0–10.5)

## 2013-09-20 LAB — GLUCOSE, CAPILLARY
Glucose-Capillary: 251 mg/dL — ABNORMAL HIGH (ref 70–99)
Glucose-Capillary: 141 mg/dL — ABNORMAL HIGH (ref 70–99)
Glucose-Capillary: 160 mg/dL — ABNORMAL HIGH (ref 70–99)
Glucose-Capillary: 58 mg/dL — ABNORMAL LOW (ref 70–99)
Glucose-Capillary: 48 mg/dL — ABNORMAL LOW (ref 70–99)

## 2013-09-20 LAB — BASIC METABOLIC PANEL
BUN: 53 mg/dL — ABNORMAL HIGH (ref 6–23)
CO2: 45 mEq/L (ref 19–32)
Calcium: 7.8 mg/dL — ABNORMAL LOW (ref 8.4–10.5)
Chloride: 94 mEq/L — ABNORMAL LOW (ref 96–112)
Creatinine, Ser: 1.27 mg/dL (ref 0.50–1.35)
GFR calc Af Amer: 60 mL/min — ABNORMAL LOW (ref >90)
GFR calc non Af Amer: 52 mL/min — ABNORMAL LOW (ref >90)
Glucose, Bld: 328 mg/dL — ABNORMAL HIGH (ref 70–99)
Potassium: 3.2 mEq/L — ABNORMAL LOW (ref 3.7–5.3)
Sodium: 144 mEq/L (ref 137–147)

## 2013-09-20 LAB — IRON AND TIBC
IRON: 12 ug/dL — AB (ref 42–135)
Saturation Ratios: 7 % — ABNORMAL LOW (ref 20–55)
TIBC: 169 ug/dL — AB (ref 215–435)
UIBC: 157 ug/dL (ref 125–400)

## 2013-09-20 LAB — VITAMIN B12: Vitamin B-12: 1004 pg/mL — ABNORMAL HIGH (ref 211–911)

## 2013-09-20 LAB — LEGIONELLA ANTIGEN, URINE: LEGIONELLA ANTIGEN, URINE: NEGATIVE

## 2013-09-20 LAB — CORTISOL: Cortisol, Plasma: 19.6 ug/dL

## 2013-09-20 LAB — TRANSFERRIN: TRANSFERRIN: 137 mg/dL — AB (ref 200–360)

## 2013-09-20 LAB — FERRITIN: FERRITIN: 241 ng/mL (ref 22–322)

## 2013-09-20 LAB — TSH: TSH: 0.834 u[IU]/mL (ref 0.350–4.500)

## 2013-09-20 LAB — OCCULT BLOOD X 1 CARD TO LAB, STOOL: Fecal Occult Bld: POSITIVE — AB

## 2013-09-20 LAB — ABO/RH: ABO/RH(D): A POS

## 2013-09-20 LAB — PREPARE RBC (CROSSMATCH)

## 2013-09-20 LAB — T4, FREE: Free T4: 1.13 ng/dL (ref 0.80–1.80)

## 2013-09-20 MED ORDER — DEXTROSE 50 % IV SOLN: 50.0000 mL | Freq: Once | INTRAVENOUS | Status: AC | PRN

## 2013-09-20 MED ORDER — FUROSEMIDE 40 MG PO TABS
40.0000 mg | ORAL_TABLET | Freq: Two times a day (BID) | ORAL | Status: DC
  Administered 2013-09-20 – 2013-09-21 (×2): 40 mg via ORAL
  Filled 2013-09-20 (×4): qty 1

## 2013-09-20 MED ORDER — CARVEDILOL 3.125 MG PO TABS
3.1250 mg | ORAL_TABLET | Freq: Two times a day (BID) | ORAL | Status: DC
  Administered 2013-09-20 – 2013-09-24 (×9): 3.125 mg via ORAL
  Filled 2013-09-20 (×14): qty 1

## 2013-09-20 MED ORDER — FLUCONAZOLE 100 MG PO TABS
100.0000 mg | ORAL_TABLET | Freq: Every day | ORAL | Status: DC
  Administered 2013-09-20 – 2013-09-27 (×8): 100 mg via ORAL
  Filled 2013-09-20 (×9): qty 1

## 2013-09-20 MED ORDER — FUROSEMIDE 10 MG/ML IJ SOLN
20.0000 mg | Freq: Once | INTRAMUSCULAR | Status: AC
  Administered 2013-09-20: 20 mg via INTRAVENOUS
  Filled 2013-09-20: qty 2

## 2013-09-20 MED ORDER — DEXTROSE 5 % IV SOLN
1.0000 g | Freq: Two times a day (BID) | INTRAVENOUS | Status: DC
  Administered 2013-09-20 – 2013-09-22 (×5): 1 g via INTRAVENOUS
  Filled 2013-09-20 (×7): qty 1

## 2013-09-20 MED ORDER — DEXTROSE 50 % IV SOLN
INTRAVENOUS | Status: AC
  Administered 2013-09-20: 50 mL
  Filled 2013-09-20: qty 50

## 2013-09-20 MED ORDER — INSULIN DETEMIR 100 UNIT/ML ~~LOC~~ SOLN
10.0000 [IU] | Freq: Every day | SUBCUTANEOUS | Status: DC
  Administered 2013-09-20: 10 [IU] via SUBCUTANEOUS
  Filled 2013-09-20 (×2): qty 0.1

## 2013-09-20 MED ORDER — POTASSIUM CHLORIDE CRYS ER 20 MEQ PO TBCR
40.0000 meq | EXTENDED_RELEASE_TABLET | Freq: Once | ORAL | Status: AC
  Administered 2013-09-20: 40 meq via ORAL
  Filled 2013-09-20: qty 2

## 2013-09-20 MED ORDER — VANCOMYCIN HCL IN DEXTROSE 750-5 MG/150ML-% IV SOLN
750.0000 mg | INTRAVENOUS | Status: DC
  Administered 2013-09-21 – 2013-09-22 (×2): 750 mg via INTRAVENOUS
  Filled 2013-09-20 (×2): qty 150

## 2013-09-20 NOTE — Progress Notes (Signed)
Hypoglycemic Event  CBG: 48  Treatment: D50 IV 50 mL  Symptoms: Pale, Sweaty and lethargic  Follow-up CBG: Time: 23:00 CBG Result: 134  Possible Reasons for Event: Inadequate meal intake     Frederick [name] B  Remember to initiate Hypoglycemia Order Set & complete

## 2013-09-20 NOTE — Progress Notes (Addendum)
[facility] PROGRESS NOTE  Frederick [name] OIT:[id number] DOB: Mar 10, 1932 DOA: 09/24/2013 PCP: Arvella Nigh, MD  Assessment/Plan  Acute hypoxic respiratory failure due to HCAP (healthcare-associated pneumonia) vs. atelectasis.  CTa negative for PE.   -  BCx NGTD -  Flu neg -  S. pneumo neg -  Legionella neg -  Continue IV vancomycin and IV cefepime -  IS  -  Wean oxygen as tolerated  Lethargy and confusion, may be related to infection or heart failure, resolving -  ABG with mild hypercapnea, however, pH elevated.  Suspect chronic CO2 retention -  Ammonia 25 -  Continue tx for HCAP -  No evidence of seizures  Chronic systolic heart failure with severe NICM EF 15-20% s/p pacemaker and recent admission for heart failure. discharge weight of 126-lbs and currently 125lbs.  Could have lost muscle/fat weight and still be volume overloaded, however. Has pronounced JVP and effusions on CXR, however, no overt pulmonary edema.  proBNP > 70K.   -  Judicious IVF:  IVF were ordered for 8 hours yesterday, however, they ran overnight and patient > 1L positive -  Restart lasix 40mg  BID -  Lasix IV during blood transfusion -  Consider cardiology consult  AKI, improved with hydration.   -  FENa:  prerenal  Blood leaking from urethra likely due to I/O cath trauma.   -  Spoke with urology Dr. Jolyne Loa regarding management who recommended avoiding cath unless he has evidence of obstruction.  If obstructed, place coude catheter and leave in for approximately 2 days.   -  Nurse to monitor uop and obtain PVR if uop decreases.  If increasing residuals, notify MD for orders  Hypertension, at risk for hypotension -  Restart coreg 3.125 BID -  Consider restarting ACEI  T2DM, hypoglycemic yesterday, but now hyperglycemic today.  May have been due to sepsis/severe illness -  Continue low dose SSI -  Start low dose levemir and monitor carefully for hypoglycemia -  Hold oral meds   Bilateral hip and  knee pain, worse than baseline -  X-rays of hip and knee:  No fracture  Left arm basilic vein thrombus -  Wife declines a/c b/c she feels it caused the large bruise of his right side  Deconditioning due to heart failure, recurrent infections, severe protein calorie malnutrition -  PT/OT assessments pending blood transfusion -  Discussed palliative care consultation with wife.  She is aware that he has limited prognosis, however, she is not interested in goals of care conversation or hospice care at this time.  Despite reassurances, she implied that her impression was that hospice kills people by giving them too much sedating medication.  Will continue to address.    Multiple pressure ulcers, feet, sacrum, back -  Wound care consult  -  Air mattress  -  booties  Severe protein calorie malnutrition -  Supplements, liberalized diet  Leukocytosis, likely related to infection -  Trend WBC -  Continue abx  Normocytic anemia, recently received blood transfusion of 2 units from [place], unclear cause of anemia -  Iron studies, b12, folate, TSH -  Occult stool -  Transfuse 2 units PRBC slowly with lasix.    Thrombocytopenia, acute phase reactant  Hypokalemia due to malnutrition, replete with oral potassium  Hypothermia due to severe heart failure and possible sepsis -  Warming blanket if needed  Thrush:  Start oral fluconazole once daily  Diet:  Dysphagia 1 Access:  PICC IVF:  yes Proph:  lovenox  Code Status: DNR Family Communication: spoke with patient and wife Disposition Plan:  Pending improvement in mentation, breathing.  Eligible for hospice, however, family declining.  Wife declines SNF and wants home health services.  They will pay OOP for 24 hour aids and live in apt in [place] near their daughter.     Consultants:  None  Procedures:  CXR  CT anio  XR knee right  XR bilateral hips  Antibiotics:  Vancomycin 2/15  Zosyn x 1 2/15  Cefepime 2/15 >>    HPI/Subjective:  Denies SOB, nausea, vomiting, diarrhea. Having sacral pain.    Objective: Filed Vitals:   09/20/13 0938 09/20/13 0959 09/20/13 1100 09/20/13 1200  BP: 126/31 132/36 138/31 139/36  Pulse: 65 66 65 45  Temp: 96.7 F (35.9 C) 97.5 F (36.4 C) 97.4 F (36.3 C) 96.9 F (36.1 C)  TempSrc: Axillary Oral Oral Axillary  Resp: 20 21 33 24  Height:      Weight:      SpO2: 100% 100% 100% 100%    Intake/Output Summary (Last 24 hours) at 09/20/13 1240 Last data filed at 09/20/13 1200  Gross per 24 hour  Intake 2077.5 ml  Output    550 ml  Net 1527.5 ml   Filed Weights   09/19/13 0504 09/19/13 0600 09/20/13 0100  Weight: 61.5 kg (135 lb 9.3 oz) 57.7 kg (127 lb 3.3 oz) 57 kg (125 lb 10.6 oz)    Exam:   General:  Cachectic BM, No acute distress  HEENT:  NCAT, MMM, JVP to preauricular area  Cardiovascular:  Distant IRRR, nl S1, S2, + gallop, 1+ pulses, cool extremities  Respiratory:   Diminished bilateral BS, no wheezes or rhonchi, no increased WOB  Abdomen:   NABS, soft, NT/ND  MSK:   Decreased tone and bulk, no LEE, bilateral arms with 1+ nonpitting edema   Neuro:  Grossly intact  Skin:  Feet with bilateral blackened unstageable ulcers on bilateral heels.  Hand-sized stage III/IV sacral decub ulcer with some central necrosis and foul odor.  Purpura which extends down the entire right axilla to the midline back and down to the sacrum   Data Reviewed: Basic Metabolic Panel:  Recent Labs Lab 09/19/13 0100 09/20/13 0530  NA 146 144  K 3.4* 3.2*  CL 93* 94*  CO2 >45* 45*  GLUCOSE 219* 328*  BUN 57* 53*  CREATININE 1.41* 1.27  CALCIUM 8.6 7.8*   Liver Function Tests:  Recent Labs Lab 09/19/13 0100  AST 29  ALT 20  ALKPHOS 91  BILITOT 1.1  PROT 6.0  ALBUMIN 2.6*   No results found for this basename: LIPASE, AMYLASE,  in the last 168 hours  Recent Labs Lab 09/19/13 1804  AMMONIA 25   CBC:  Recent Labs Lab 09/19/13 0100  09/20/13 0530  WBC 12.8* 11.8*  NEUTROABS 11.3*  --   HGB 7.9* 6.6*  HCT 26.5* 21.3*  MCV 88.9 90.3  PLT 138* 109*   Cardiac Enzymes:  Recent Labs Lab 09/19/13 0100  TROPONINI <0.30   BNP (last 3 results)  Recent Labs  08/06/13 1121 08/12/13 1649 09/19/13 0100  PROBNP >70000.0* 61891.0* >70000.0*   CBG:  Recent Labs Lab 09/19/13 1722 09/19/13 1828 09/19/13 2116 09/20/13 0745 09/20/13 1139  GLUCAP 68* 70 138* 251* 141*    Recent Results (from the past 240 hour(s))  CULTURE, BLOOD (ROUTINE X 2)     Status: None   Collection Time    09/19/13  5:05  AM      Result Value Ref Range Status   Specimen Description BLOOD RIGHT WRIST   Final   Special Requests BOTTLES DRAWN AEROBIC AND ANAEROBIC 5CC   Final   Culture  Setup Time     Final   Value: 09/19/2013 13:28     Performed at [facility]   Culture     Final   Value:        BLOOD CULTURE RECEIVED NO GROWTH TO DATE CULTURE WILL BE HELD FOR 5 DAYS BEFORE ISSUING A FINAL NEGATIVE REPORT     Performed at [facility]   Report Status PENDING   Incomplete  CULTURE, BLOOD (ROUTINE X 2)     Status: None   Collection Time    09/19/13  5:18 AM      Result Value Ref Range Status   Specimen Description BLOOD BLOOD RIGHT FOREARM   Final   Special Requests BOTTLES DRAWN AEROBIC ONLY 4CC   Final   Culture  Setup Time     Final   Value: 09/19/2013 13:28     Performed at [facility]   Culture     Final   Value:        BLOOD CULTURE RECEIVED NO GROWTH TO DATE CULTURE WILL BE HELD FOR 5 DAYS BEFORE ISSUING A FINAL NEGATIVE REPORT     Performed at [facility]   Report Status PENDING   Incomplete  MRSA PCR SCREENING     Status: None   Collection Time    09/19/13  6:37 AM      Result Value Ref Range Status   MRSA by PCR NEGATIVE  NEGATIVE Final   Comment:            The GeneXpert MRSA Assay (FDA     approved for NASAL specimens     only), is one component of a     comprehensive MRSA  colonization     surveillance program. It is not     intended to diagnose MRSA     infection nor to guide or     monitor treatment for     MRSA infections.     Studies: Dg Chest 2 View (if Patient Has Fever And/or Copd)  09/05/2013   CLINICAL DATA:  Weakness, shortness of breath  EXAM: CHEST  2 VIEW  COMPARISON:  Prior radiograph from 08/12/2013  FINDINGS: Left-sided pacemaker is unchanged.  Cardiomegaly is stable.  Lungs are mildly hyperinflated with attenuation of the pulmonary markings, suggesting COPD. Bilateral pleural effusions are present, left greater than right. There is dense retrocardiac left lower lobe opacity, which may reflect atelectasis or infiltrate. Scattered linear opacity within the right lung base most likely reflects atelectasis. Irregular biapical pleural thickening noted. No pneumothorax. No overt pulmonary edema.  No acute osseous abnormality. Severe degenerative changes noted at the [facility] and shoulders bilaterally.  IMPRESSION: 1. Left pleural effusion with associated dense retrocardiac left lower lobe opacity, which may reflect atelectasis or possibly infiltrate. 2. Smaller right pleural effusion with mild right basilar atelectasis. 3. Stable cardiomegaly without pulmonary edema. 4. COPD.   Electronically Signed   By: Rise MuBenjamin  McClintock M.D.   On: 09/22/2013 22:39   Dg Hip Bilateral W/pelvis  09/19/2013   CLINICAL DATA:  Bilateral hip pain and right knee pain.  EXAM: BILATERAL HIP WITH PELVIS - 4+ VIEW  COMPARISON:  No priors.  FINDINGS: AP view of the pelvis and AP and lateral views of  the hips bilaterally demonstrate postoperative changes of bilateral total hip arthroplasty. The femoral and acetabular components of both of the prostheses appear to be properly seated without definite periprosthetic fracture. Bilateral femoral heads are located. Cerclage wire surrounding the trochanteric region of the right proximal femur. Bony pelvis appears intact. Bones are mildly  osteopenic.  IMPRESSION: Status post bilateral total hip arthroplasty without definite periprosthetic fracture or other acute complicating features.   Electronically Signed   By: Trudie Reed M.D.   On: 09/19/2013 10:47   Dg Knee 1-2 Views Right  09/19/2013   CLINICAL DATA:  Right knee pain.  EXAM: RIGHT KNEE - 1-2 VIEW  COMPARISON:  No priors.  FINDINGS: There is no evidence of fracture, dislocation, or joint effusion. There is no evidence of arthropathy or other focal bone abnormality. Soft tissues are unremarkable.  IMPRESSION: Negative.   Electronically Signed   By: Trudie Reed M.D.   On: 09/19/2013 10:48   Ct Angio Chest Pe W/cm &/or Wo Cm  09/19/2013   CLINICAL DATA:  80 year old male with shortness of breath, pleural effusions. Pain. Initial encounter.  EXAM: CT ANGIOGRAPHY CHEST WITH CONTRAST  TECHNIQUE: Multidetector CT imaging of the chest was performed using the standard protocol during bolus administration of intravenous contrast. Multiplanar CT image reconstructions and MIPs were obtained to evaluate the vascular anatomy.  CONTRAST:  80mL OMNIPAQUE IOHEXOL 350 MG/ML SOLN  COMPARISON:  Chest CTA 05/25/2011.  FINDINGS: Good contrast bolus timing in the pulmonary arterial tree. Some mixing artifact is noted in the distal left main pulmonary artery. This does not appear confluent or suspicious for embolus. Loss of lower lobe pulmonary artery opacification occurs in a gradient like fashion. No abrupt or suspicious pulmonary artery filling defect identified.  Large layering bilateral pleural effusions. Simple fluid densitometry compatible with transudate. No abnormal pleural thickening or enhancement.  Cardiomegaly. No pericardial effusion. Left chest cardiac pacemaker device. No mediastinal lymphadenopathy. Calcified atherosclerosis of the aorta and coronary arteries.  Stable and negative visualized upper abdominal viscera, with additional calcified atherosclerosis.  Major airways are patent.  There is left worse than right compressive lower lobe collapse related to the effusions. There is superimposed dependent upper lobe atelectasis. No evidence of superimposed pneumonia at this time. Some chronic architectural distortion in the anterior right upper lobe is stable.  Stable visualized osseous structures.  Review of the MIP images confirms the above findings.  IMPRESSION: 1. No strong evidence of acute pulmonary embolus. 2. Large layering and simple appearing bilateral pleural effusions with near complete lower lobe collapse, greater on the left, and upper lobe compressive atelectasis.   Electronically Signed   By: Augusto Gamble M.D.   On: 09/19/2013 06:12   Dg Chest Port 1 View  09/20/2013   CLINICAL DATA:  Post hydration.  EXAM: PORTABLE CHEST - 1 VIEW  COMPARISON:  09/19/2013  FINDINGS: Left lower lobe atelectasis or infiltrate again noted, unchanged. Mild cardiomegaly. Support devices are stable. Suspect small bilateral effusions.  IMPRESSION: No significant change since prior study.   Electronically Signed   By: Charlett Nose M.D.   On: 09/20/2013 06:54   Dg Chest Port 1 View  09/19/2013   CLINICAL DATA:  Line placement.  EXAM: PORTABLE CHEST - 1 VIEW  COMPARISON:  CT chest 09/19/2013 at 1554 hr and PA and lateral chest 09-26-2013.  FINDINGS: The patient has a new right PICC in place. The tip is difficult to visualize due to patient rotation but it appears to lie in  the mid superior vena cava. Bilateral pleural effusions and basilar airspace disease persist. There is cardiomegaly. No pneumothorax.  IMPRESSION: Tip of right PICC is difficult to visualize due to patient rotation but it appears to lie in the mid superior vena cava.  Persistent bilateral effusions and airspace disease.   Electronically Signed   By: Drusilla Kanner M.D.   On: 09/19/2013 16:50    Scheduled Meds: . ceFEPime (MAXIPIME) IV  1 g Intravenous Q12H  . feeding supplement (ENSURE COMPLETE)  237 mL Oral BID BM  . fluconazole   100 mg Oral Daily  . furosemide  20 mg Intravenous Once  . insulin aspart  0-5 Units Subcutaneous QHS  . insulin aspart  0-9 Units Subcutaneous TID WC  . insulin detemir  10 Units Subcutaneous Daily  . pantoprazole (PROTONIX) IV  40 mg Intravenous BID  . saccharomyces boulardii  250 mg Oral BID  . sodium chloride  10-40 mL Intracatheter Q12H  . 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 If 7PM-7AM, please contact night-coverage at [email or web address], password [facility] 09/20/2013, 12:40 PM  LOS: 2 days

## 2013-09-20 NOTE — Progress Notes (Signed)
INITIAL NUTRITION ASSESSMENT  DOCUMENTATION CODES Per approved criteria  -Severe malnutrition in the context of chronic illness  Pt meets criteria for severe MALNUTRITION in the context of chronic illness as evidenced by PO intake < 75% est nutrition needs for > one month, evident loss of subcutaneous fat and muscle wasting in upper arm, temporal, clavicle and thoracic regions.    INTERVENTION: -Recommend to add MagicCup protein supplement BID -Continue to recommend Ensure Complete po BID, each supplement provides 350 kcal and 13 grams of protein -Encouraged intake of high protein foods -Will continue to monitor  NUTRITION DIAGNOSIS: Inadequate oral intake related to decreased appetite as evidenced by PO intake <75%.   Goal: Pt to meet >/= 90% of their estimated nutrition needs    Monitor:  Total protein/energy intake, labs, weights, skin integrity, swallow profile  Reason for Assessment: Consult to Assess/Low Braden  80 y.o. male  Admitting Dx: HCAP (healthcare-associated pneumonia)  ASSESSMENT: Frederick [name] is a 80 y.o. male with Past medical history of hypertension, diabetes mellitus, pacemaker insertion for bradycardia, CHF. The patient is coming from home.  The patient appears poor historian therefore the history has been obtained from ED physician. As per the ED physician's documentation the patient was recently admitted to the hospital for possible pneumonia and after the discharge she has been unable to take care of himself. Patient lives with his wife and patient takes care of his wife. Since last few days patient has been having complaints of bilateral hip pain and has been able to walk secondary to that and therefore has been bed bound. He also has some generalized weakness. Patient has developed bilateral sacral ulcers and wounds to bilateral heels and therefore they brought him to the hospital.   -Pt reported poor appetite since Christmas. Diet recall indicated pt  skips breakfast, and will try to eat 2 meals per day. Will drink Ensure occasionally. 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  -Positive for weight loss -Tolerates soft foods at home. RN noted pt refused breakfast and ate small amounts of apple sauce and ice cream.Drank one Ensure. Pt reported to enjoy supplement and will continue with regmien. Has hx of DM2, but has minimal intake of CHO from meals -Pt also willing to try MagicCup as snacks. Inquired about different foods that would be good for his skin. Encouraged pt to eat protein sources with each meals and continue with supplement intake -WOC evaluated pt. Noted unstageable bilateral heel and stage 3 sacral pressure ulcers. Expressed concern for nutritional status  Nutrition Focused Physical Exam:  Subcutaneous Fat:  Orbital Region: moderate Upper Arm Region: severe Thoracic and Lumbar Region: moderate  Muscle:  Temple Region: moderate Clavicle Bone Region: severe Clavicle and Acromion Bone Region: moderate Scapular Bone Region: moderate Dorsal Hand: severe Patellar Region: n/a Anterior Thigh Region: n/a Posterior Calf Region: n/a  Edema: trace pedal edema per MD note     Height: Ht Readings from Last 1 Encounters:  09/19/13 5\' 7"  (1.702 m)    Weight: Wt Readings from Last 1 Encounters:  09/20/13 125 lb 10.6 oz (57 kg)    Ideal Body Weight: 148 lbs  % Ideal Body Weight: 84%  Wt Readings from Last 10 Encounters:  09/20/13 125 lb 10.6 oz (57 kg)  08/17/13 135 lb 9.3 oz (61.5 kg)  08/10/13 126 lb 1.7 oz (57.2 kg)  04/21/13 124 lb 5.4 oz (56.4  kg)  04/21/13 124 lb 5.4 oz (56.4 kg)    Usual Body Weight: unable to determine  % Usual Body Weight: N/A  BMI:  Body mass index is 19.68 kg/(m^2).  Estimated Nutritional Needs: Kcal: 1700-1900 Protein: 75-85 gram Fluid: >/=1700 ml/daily  Skin:  unstageable right heel ulcer, stage 3 left heel ulcer and sacral pressure ulcer  Diet Order: Dysphagia  EDUCATION NEEDS: -No education needs identified at this time   Intake/Output Summary (Last 24 hours) at 09/20/13 1054 Last data filed at 09/20/13 0959  Gross per 24 hour  Intake 2007.5 ml  Output    550 ml  Net 1457.5 ml    Last BM: 2/15   Labs:   Recent Labs Lab 09/19/13 0100 09/20/13 0530  NA 146 144  K 3.4* 3.2*  CL 93* 94*  CO2 >45* 45*  BUN 57* 53*  CREATININE 1.41* 1.27  CALCIUM 8.6 7.8*  GLUCOSE 219* 328*    CBG (last 3)   Recent Labs  09/19/13 1828 09/19/13 2116 09/20/13 0745  GLUCAP 70 138* 251*    Scheduled Meds: . ceFEPime (MAXIPIME) IV  1 g Intravenous Q12H  . feeding supplement (ENSURE COMPLETE)  237 mL Oral BID BM  . fluconazole  100 mg Oral Daily  . furosemide  20 mg Intravenous Once  . insulin aspart  0-5 Units Subcutaneous QHS  . insulin aspart  0-9 Units Subcutaneous TID WC  . insulin detemir  10 Units Subcutaneous Daily  . pantoprazole (PROTONIX) IV  40 mg Intravenous BID  . saccharomyces boulardii  250 mg Oral BID  . sodium chloride  10-40 mL Intracatheter Q12H  . [START ON 09/21/2013] vancomycin  750 mg Intravenous Q24H    Continuous Infusions:   Past Medical History  Diagnosis Date  . Hypertension   . Diabetes mellitus   . Symptomatic bradycardia     s/p pacemaker  . CHF (congestive heart failure)   . Dysrhythmia   . Shortness of breath   . Pacemaker   . Anginal pain     PER PATIENT  . Pulmonary hypertension 04/15/2013  . HTN (hypertension) 08/06/2013  . Protein-calorie malnutrition, severe 08/06/2013  . Ex-cigarette smoker 08/08/2013    Past Surgical History  Procedure Laterality Date  . Pacemaker insertion  06/16/2010    Biotronik devise at [place]  . Neck surgery    . Joint replacement      bilateral hip  . Cholecystectomy      Lloyd Huger MS RD LDN Clinical Dietitian Pager:[phone]

## 2013-09-20 NOTE — Progress Notes (Signed)
OT Cancellation Note  Patient Details Name: Frederick [name] MRN: [id number] DOB: 11-11-1931   Cancelled Treatment:    Reason Eval/Treat Not Completed: Medical issues which prohibited therapy Pt with low Hgb today. Will check back another time.  Lennox Laity [phone] 09/20/2013, 10:05 AM

## 2013-09-20 NOTE — Progress Notes (Signed)
PT Cancellation Note  Patient Details Name: Frederick [name] MRN: [id number] DOB: 10-Jun-1932   Cancelled Treatment:    Reason Eval/Treat Not Completed: Medical issues which prohibited therapy (low Hgb, getting blood.)   Rada Hay 09/20/2013, 9:54 AM Blanchard Kelch PT [phone]

## 2013-09-20 NOTE — Progress Notes (Signed)
ANTIBIOTIC CONSULT NOTE - Follow up  Pharmacy Consult for Vancomycin & Renally dose adjusted antibiotics Indication: Pneumonia  No Known Allergies  Patient Measurements: Height: 5\' 7"  (170.2 cm) Weight: 125 lb 10.6 oz (57 kg) IBW/kg (Calculated) : 66.1  Vital Signs: Temp: 96.8 F (36 C) (02/16 0350) Temp src: Axillary (02/16 0350) BP: 126/37 mmHg (02/16 0400) Pulse Rate: 64 (02/16 0400) Intake/Output from previous day: 02/15 0701 - 02/16 0700 In: 1918.8 [P.O.:420; I.V.:573.8; IV Piggyback:250] Out: 550 [Urine:550]  Labs:  Recent Labs  09/19/13 0100 09/19/13 0634 09/20/13 0530  WBC 12.8*  --  11.8*  HGB 7.9*  --  6.6*  PLT 138*  --  109*  LABCREA  --  59.8  --   CREATININE 1.41*  --  1.27   Estimated Creatinine Clearance: 38 ml/min (by C-G formula based on Cr of 1.27).  Microbiology: 2/15 MRSA: negative 2/15 blood x 2: NGTD 2/15 sputum: ordered  Anti-infectives: 2/15>> Zosyn x 1 in ED >> 2/15 2/15 >>vanc x 8 days >> 2/15 >>cefepime x 8 days >>    Assessment: 80 yr male with weakness and shortness of breath.  Noted sacral ulcers and wounds to both heels.  Pharmacy consulted to dose vancomycin and renally adjust antibiotic doses as needed for r/o HCAP.  Tmax: AF  WBCs: 11.8  Renal: Scr improved (1.41 > 1.27), CrCl ~ 38 ml/min  Blood and sputum cultures ordered  Goal of Therapy:  Vancomycin trough level 15-20 mcg/ml Appropriate abx dosing, eradication of infection.   Plan:   Cefepime 1g IV q12h  Vancomycin 750mg  IV q24 h.  Measure Vanc trough at steady state.  Follow up renal fxn and culture results.    Lynann Beaver PharmD, BCPS Pager [phone] 09/20/2013 8:35 AM

## 2013-09-20 NOTE — Progress Notes (Signed)
CARE MANAGEMENT NOTE 09/20/2013  Patient:  RAMSEY, BJERK   Account Number:  [id number]  Date Initiated:  09/20/2013  Documentation initiated by:  DAVIS,RHONDA  Subjective/Objective Assessment:   hcap     Action/Plan:   from home   Anticipated DC Date:  09/23/2013   Anticipated DC Plan:  HOME/SELF CARE  In-house referral  NA      DC Planning Services  CM consult      PAC Choice  NA   Choice offered to / List presented to:  NA   DME arranged  NA      DME agency  NA  NA     HH arranged  NA      HH agency  NA   Status of service:  In process, will continue to follow Medicare Important Message given?  NA - LOS <3 / Initial given by admissions (If response is "NO", the following Medicare IM given date fields will be blank) Date Medicare IM given:   Date Additional Medicare IM given:    Discharge Disposition:    Per UR Regulation:  Reviewed for med. necessity/level of care/duration of stay  If discussed at Long Length of Stay Meetings, dates discussed:    Comments:  [id number]/Rhonda Stark Jock, BSN, [place] [phone] Chart Reviewed for discharge and hospital needs. Discharge needs at time of review:  None present will follow for needs. Review of patient progress due on [phone]. will discuss need for aides around the clock once patient is stablized/insurance may not cover.

## 2013-09-20 NOTE — Clinical Documentation Improvement (Signed)
Per 09/19/13 H&P.Marland KitchenMarland Kitchen"Past medical history of hypertension, diabetes mellitus, pacemaker insertion for bradycardia, CHF."... Noted 09/19/13 Prog note.Marland KitchenMarland Kitchen"Could have lost muscle/fat weight and still be volume overloaded, however. Has pronounced JVP and effusions on CXR, however, no overt pulmonary edema. proBNP > 70K. - Currently 127-lbs - Judicious IVF - Please consult cardiology in the AM - Repeat CXR in AM."...   For accurate Dx specificity & severity can noted "CHF" be further clarified w/ acuity & type for cond being mon'd, eval'd & tx'd. Thank you  Possible Clinical Conditions? Chronic Systolic or Diastolic Congestive Heart Failure Chronic Systolic & Diastolic Congestive Heart Failure Acute Systolic or Diastolic Congestive Heart Failure Acute Systolic & Diastolic Congestive Heart Failure Acute on Chronic Systolic or Diastolic Congestive Heart Failure Acute on Chronic Systolic & Diastolic Congestive Heart Failure Other Condition Cannot Clinically Determine  Supporting Information: Risk Factors: See above note Signs & Symptoms: See above note Diagnostics: See above note Treatment: See above note  Thank You, Toribio Harbour, RN, BSN, CCDS Certified Clinical Documentation Specialist Pager: [phone] [facility]- Health Information Management

## 2013-09-21 DIAGNOSIS — D638 Anemia in other chronic diseases classified elsewhere: Secondary | ICD-10-CM | POA: Diagnosis present

## 2013-09-21 DIAGNOSIS — D509 Iron deficiency anemia, unspecified: Secondary | ICD-10-CM | POA: Diagnosis present

## 2013-09-21 LAB — TYPE AND SCREEN
ABO/RH(D): A POS
ANTIBODY SCREEN: NEGATIVE
UNIT DIVISION: 0
Unit division: 0

## 2013-09-21 LAB — GLUCOSE, CAPILLARY
GLUCOSE-CAPILLARY: 134 mg/dL — AB (ref 70–99)
GLUCOSE-CAPILLARY: 118 mg/dL — AB (ref 70–99)
GLUCOSE-CAPILLARY: 179 mg/dL — AB (ref 70–99)
Glucose-Capillary: 134 mg/dL — ABNORMAL HIGH (ref 70–99)
Glucose-Capillary: 33 mg/dL — CL (ref 70–99)
Glucose-Capillary: 137 mg/dL — ABNORMAL HIGH (ref 70–99)
Glucose-Capillary: 49 mg/dL — ABNORMAL LOW (ref 70–99)
Glucose-Capillary: 160 mg/dL — ABNORMAL HIGH (ref 70–99)
Glucose-Capillary: 171 mg/dL — ABNORMAL HIGH (ref 70–99)

## 2013-09-21 LAB — BASIC METABOLIC PANEL
BUN: 50 mg/dL — AB (ref 6–23)
CHLORIDE: 99 meq/L (ref 96–112)
CO2: >45 mEq/L (ref 19–32)
CREATININE: 1.32 mg/dL (ref 0.50–1.35)
Calcium: 7.9 mg/dL — ABNORMAL LOW (ref 8.4–10.5)
GFR calc Af Amer: 58 mL/min — ABNORMAL LOW (ref >90)
GFR calc non Af Amer: 50 mL/min — ABNORMAL LOW (ref >90)
Glucose, Bld: 32 mg/dL — CL (ref 70–99)
Potassium: 3.5 mEq/L — ABNORMAL LOW (ref 3.7–5.3)
Sodium: 146 mEq/L (ref 137–147)

## 2013-09-21 LAB — CBC
HEMATOCRIT: 29.6 % — AB (ref 39.0–52.0)
HEMOGLOBIN: 9.5 g/dL — AB (ref 13.0–17.0)
MCH: 28.3 pg (ref 26.0–34.0)
MCHC: 32.1 g/dL (ref 30.0–36.0)
MCV: 88.1 fL (ref 78.0–100.0)
Platelets: 98 10*3/uL — ABNORMAL LOW (ref 150–400)
RBC: 3.36 MIL/uL — ABNORMAL LOW (ref 4.22–5.81)
RDW: 18.9 % — AB (ref 11.5–15.5)
WBC: 12.8 10*3/uL — AB (ref 4.0–10.5)

## 2013-09-21 LAB — PROCALCITONIN: Procalcitonin: 0.34 ng/mL

## 2013-09-21 LAB — GLUCOSE, RANDOM: Glucose, Bld: 51 mg/dL — ABNORMAL LOW (ref 70–99)

## 2013-09-21 LAB — FOLATE RBC: RBC Folate: 1125 ng/mL — ABNORMAL HIGH (ref >280)

## 2013-09-21 MED ORDER — PANTOPRAZOLE SODIUM 40 MG PO TBEC
40.0000 mg | DELAYED_RELEASE_TABLET | Freq: Two times a day (BID) | ORAL | Status: DC
  Administered 2013-09-21 – 2013-09-30 (×19): 40 mg via ORAL
  Filled 2013-09-21 (×30): qty 1

## 2013-09-21 MED ORDER — DEXTROSE 50 % IV SOLN
50.0000 mL | Freq: Once | INTRAVENOUS | Status: AC | PRN
Start: 2013-09-21 — End: 2013-09-21

## 2013-09-21 MED ORDER — POTASSIUM CHLORIDE CRYS ER 10 MEQ PO TBCR
10.0000 meq | EXTENDED_RELEASE_TABLET | Freq: Every day | ORAL | Status: DC
  Administered 2013-09-21 – 2013-09-24 (×4): 10 meq via ORAL
  Filled 2013-09-21 (×5): qty 1

## 2013-09-21 MED ORDER — DEXTROSE 50 % IV SOLN
INTRAVENOUS | Status: AC
  Administered 2013-09-21: 06:00:00
  Filled 2013-09-21: qty 50

## 2013-09-21 MED ORDER — DEXTROSE 50 % IV SOLN: 50.0000 mL | Freq: Once | INTRAVENOUS | Status: AC | PRN

## 2013-09-21 MED ORDER — FUROSEMIDE 80 MG PO TABS
80.0000 mg | ORAL_TABLET | Freq: Two times a day (BID) | ORAL | Status: DC
  Administered 2013-09-21 – 2013-09-24 (×7): 80 mg via ORAL
  Filled 2013-09-21 (×8): qty 1

## 2013-09-21 MED ORDER — DEXTROSE 50 % IV SOLN
25.0000 mL | Freq: Once | INTRAVENOUS | Status: AC | PRN
  Administered 2013-09-21: 25 mL via INTRAVENOUS
  Filled 2013-09-21: qty 50

## 2013-09-21 MED ORDER — FERUMOXYTOL INJECTION 510 MG/17 ML
510.0000 mg | Freq: Once | INTRAVENOUS | Status: AC
  Administered 2013-09-21: 510 mg via INTRAVENOUS
  Filled 2013-09-21: qty 17

## 2013-09-21 NOTE — Progress Notes (Signed)
Hypoglycemic Event  CBG: 49  Treatment: Dextrose 50% 27ml  Symptoms: none  Follow-up CBG: [id number] CBG Result:118  Possible Reasons for Event: unknown  Comments/MD notified: yes    Frederick [name], [name]  Remember to initiate Hypoglycemia Order Set & complete

## 2013-09-21 NOTE — Progress Notes (Signed)
Hypoglycemic Event  CBG: 33  Treatment: D50 IV 50 mL  Symptoms: None  Follow-up CBG: Time:0520 CBG Result:137  Possible Reasons for Event: Unknown      Frederick [name]  Remember to initiate Hypoglycemia Order Set & complete

## 2013-09-21 NOTE — Progress Notes (Signed)
Spoke with pt's wife and son Frederick [name] via cell [phone] for a long time concerning Home Health, SNF. Wife has used several Home Health Agencies in the surrounding area. To offer choice of HH, this CM will check to see which agencies can service Pt at present time. Pt's wife states that she do not want pt to go to a SNF right now.

## 2013-09-21 NOTE — Progress Notes (Addendum)
[facility] PROGRESS NOTE  Frederick [name] ZOX:[id number] DOB: Sep 30, 1931 DOA: 09/25/2013 PCP: Arvella Nigh, MD  Brief Summary  Frederick [name] is a 80 y.o. male with Past medical history of hypertension, diabetes mellitus, pacemaker insertion for bradycardia, severe NICM with EF of 15-20%, who has had 4 hospitalizations in the last month and rapid decline in functional status.  He was discharged from the [place] to home at the request of his debilitated wife who also has a caregiver, however, he was severely deconditioned and probably needed SNF.  He was unable to care for himself and he had previously been her caretaker.  He complained of increasing pain on his sacrum from his large stage III sacral decub so he returned to the hospital.  He is being tx for HCAP, assessing fluid status.  He required transfusion of blood due to GI losses, but he is not a good candidate for colo or surgery.  Per wife, he was transfused just a few days ago at the [place] for same reason.  Given his heart failure, weight loss, severe malnutrition, rapid decline, recurrent anemia, he would be a good candidate for palliative care, however, the wife has repeatedly declined.  He is DNR.  He is recommended for SNF, however, the wife again wants him to be discharged to an apartment in [place] and she would like to arrange for 24 hour aid assistance for both of them.  Social work and case management are assisting.    Assessment/Plan  Acute hypoxic respiratory failure due to HCAP (healthcare-associated pneumonia) vs. atelectasis.  CTa negative for PE.   -  BCx NGTD -  Flu neg -  S. pneumo neg -  Legionella neg -  Continue IV vancomycin and IV cefepime -  IS  -  Wean oxygen as tolerated  Lethargy and confusion, may be related to infection or heart failure, happens sporadically and resolves spontaneously -  ABG with mild hypercapnea, however, pH elevated.  Suspect chronic CO2 retention -  Ammonia 25 -  Continue tx for HCAP -  No  evidence of seizures  Chronic systolic heart failure with severe NICM EF 15-20% s/p pacemaker and recent admission for heart failure. discharge weight of 126-lbs and currently 125lbs.  Could have lost muscle/fat weight and still be volume overloaded, however. Has pronounced JVP and effusions on CXR, however, no overt pulmonary edema.  proBNP > 70K.   -  Increase lasix to 80mg  BID  -  Start low dose daily potassium repletion -  Daily weights -  Strict I/O  AKI, mildly improved with hydration, but worse today despite blood transfusion yesterday -  FENa:  Prerenal   Blood leaking from urethra likely due to I/O cath trauma, resolving -  Spoke with urology Dr. Jolyne Loa regarding management who recommended avoiding cath unless he has evidence of obstruction.  If obstructed, place coude catheter and leave in for approximately 2 days.   -  Nurse to monitor uop and obtain PVR if uop decreases.  If increasing residuals, notify MD for orders  Hypertension, at risk for hypotension -  Restart coreg 3.125 BID -  Hold ACEI due to AKI  T2DM, hypoglycemic A1c 9.2 last month -  Continue low dose SSI -  D/c levemir -  Hold oral meds   Bilateral hip and knee pain, worse than baseline -  X-rays of hip and knee:  No fracture  Left arm basilic vein thrombus -  Wife declines a/c b/c she feels it caused the large bruise  of his right side  Deconditioning due to heart failure, recurrent infections, severe protein calorie malnutrition -  PT/OT assessments pending  Multiple pressure ulcers, feet, sacrum, back -  Appreciate wound care recs -  Air mattress  -  booties  Severe protein calorie malnutrition -  Supplements, liberalized diet  Leukocytosis, likely related to infection, stable, unimproved since admission -  Trend WBC -  Continue abx  Normocytic anemia, recently received blood transfusion of 2 units from [place], occult stool positive.  Patient is not a good candidate for surgery or colonoscopy  given diminished ACOG status -  Iron studies consistent with anemia of chronic disease but given occult positive stool, may be mixed picture -  Feraheme x 1  -  b12 1004 folate wnl, TSH 0.834 -  Occult stool positive -  S/p 2 units PRBC  Thrombocytopenia, acute phase reactant  Hypokalemia due to malnutrition, replete with oral potassium  Hypothermia due to severe heart failure and possible sepsis -  Warming blanket if needed  Thrush:  Continue oral fluconazole once daily  Diet:  Dysphagia 1 Access:  PICC IVF:  yes Proph:  lovenox  Code Status: DNR Family Communication: spoke with patient and wife Disposition Plan:  PT/OT evaluations.  Needs stable CBG.  Eligible for hospice, however, family declining.  Wife declines SNF and wants home health services.  They will pay OOP for 24 hour aids and live in apt in [place] near their daughter.     Consultants:  None  Procedures:  CXR  CT anio  XR knee right  XR bilateral hips  Antibiotics:  Vancomycin 2/15  Zosyn x 1 2/15  Cefepime 2/15 >>   HPI/Subjective:  Denies SOB, nausea, vomiting, diarrhea. Had BM yesterday.  Sacral pain okay this morning.  Feels he is near his baseline.    Objective: Filed Vitals:   09/21/13 0441 09/21/13 0759 09/21/13 0800 09/21/13 1246  BP:  134/35  127/45  Pulse:  70  117  Temp:   97.7 F (36.5 C) 97.8 F (36.6 C)  TempSrc:   Oral Oral  Resp:    16  Height:      Weight: 57.5 kg (126 lb 12.2 oz)   59.33 kg (130 lb 12.8 oz)  SpO2:    92%    Intake/Output Summary (Last 24 hours) at 09/21/13 1325 Last data filed at 09/21/13 1255  Gross per 24 hour  Intake  882.5 ml  Output    750 ml  Net  132.5 ml   Filed Weights   09/20/13 0100 09/21/13 0441 09/21/13 1246  Weight: 57 kg (125 lb 10.6 oz) 57.5 kg (126 lb 12.2 oz) 59.33 kg (130 lb 12.8 oz)    Exam:   General:  Cachectic BM, No acute distress  HEENT:  NCAT, MMM, JVP to preauricular area   Cardiovascular:  Distant IRRR,  nl S1, S2, + gallop, 1+ pulses, cool extremities  Respiratory:   Diminished bilateral BS, no wheezes or rhonchi, no increased WOB  Abdomen:   NABS, soft, NT/ND  MSK:   Decreased tone and bulk, no LEE, bilateral arms with 1+ nonpitting edema   Neuro:  Grossly intact  Skin:  Feet with bilateral blackened unstageable ulcers on bilateral heels.  Hand-sized stage III/IV sacral decub ulcer not observed today.  Purpura which extends down the entire right axilla to the midline back and down to the sacrum   Data Reviewed: Basic Metabolic Panel:  Recent Labs Lab 09/19/13 0100 09/20/13 0530 09/21/13  0430 09/21/13 0825  NA 146 144 146  --   K 3.4* 3.2* 3.5*  --   CL 93* 94* 99  --   CO2 >45* 45* >45*  --   GLUCOSE 219* 328* 32* 51*  BUN 57* 53* 50*  --   CREATININE 1.41* 1.27 1.32  --   CALCIUM 8.6 7.8* 7.9*  --    Liver Function Tests:  Recent Labs Lab 09/19/13 0100  AST 29  ALT 20  ALKPHOS 91  BILITOT 1.1  PROT 6.0  ALBUMIN 2.6*   No results found for this basename: LIPASE, AMYLASE,  in the last 168 hours  Recent Labs Lab 09/19/13 1804  AMMONIA 25   CBC:  Recent Labs Lab 09/19/13 0100 09/20/13 0530 09/21/13 0430  WBC 12.8* 11.8* 12.8*  NEUTROABS 11.3*  --   --   HGB 7.9* 6.6* 9.5*  HCT 26.5* 21.3* 29.6*  MCV 88.9 90.3 88.1  PLT 138* 109* 98*   Cardiac Enzymes:  Recent Labs Lab 09/19/13 0100  TROPONINI <0.30   BNP (last 3 results)  Recent Labs  08/06/13 1121 08/12/13 1649 09/19/13 0100  PROBNP >70000.0* 61891.0* >70000.0*   CBG:  Recent Labs Lab 09/21/13 0517 09/21/13 0538 09/21/13 0747 09/21/13 0849 09/21/13 1112  GLUCAP 33* 137* 49* 118* 160*    Recent Results (from the past 240 hour(s))  CULTURE, BLOOD (ROUTINE X 2)     Status: None   Collection Time    09/19/13  5:05 AM      Result Value Ref Range Status   Specimen Description BLOOD RIGHT WRIST   Final   Special Requests BOTTLES DRAWN AEROBIC AND ANAEROBIC 5CC   Final    Culture  Setup Time     Final   Value: 09/19/2013 13:28     Performed at [facility]   Culture     Final   Value:        BLOOD CULTURE RECEIVED NO GROWTH TO DATE CULTURE WILL BE HELD FOR 5 DAYS BEFORE ISSUING A FINAL NEGATIVE REPORT     Performed at [facility]   Report Status PENDING   Incomplete  CULTURE, BLOOD (ROUTINE X 2)     Status: None   Collection Time    09/19/13  5:18 AM      Result Value Ref Range Status   Specimen Description BLOOD BLOOD RIGHT FOREARM   Final   Special Requests BOTTLES DRAWN AEROBIC ONLY 4CC   Final   Culture  Setup Time     Final   Value: 09/19/2013 13:28     Performed at [facility]   Culture     Final   Value:        BLOOD CULTURE RECEIVED NO GROWTH TO DATE CULTURE WILL BE HELD FOR 5 DAYS BEFORE ISSUING A FINAL NEGATIVE REPORT     Performed at [facility]   Report Status PENDING   Incomplete  MRSA PCR SCREENING     Status: None   Collection Time    09/19/13  6:37 AM      Result Value Ref Range Status   MRSA by PCR NEGATIVE  NEGATIVE Final   Comment:            The GeneXpert MRSA Assay (FDA     approved for NASAL specimens     only), is one component of a     comprehensive MRSA colonization     surveillance program. It is not  intended to diagnose MRSA     infection nor to guide or     monitor treatment for     MRSA infections.     Studies: Dg Chest Port 1 View  09/20/2013   CLINICAL DATA:  Post hydration.  EXAM: PORTABLE CHEST - 1 VIEW  COMPARISON:  09/19/2013  FINDINGS: Left lower lobe atelectasis or infiltrate again noted, unchanged. Mild cardiomegaly. Support devices are stable. Suspect small bilateral effusions.  IMPRESSION: No significant change since prior study.   Electronically Signed   By: Charlett Nose M.D.   On: 09/20/2013 06:54   Dg Chest Port 1 View  09/19/2013   CLINICAL DATA:  Line placement.  EXAM: PORTABLE CHEST - 1 VIEW  COMPARISON:  CT chest 09/19/2013 at 1554 hr and PA and lateral  chest 09/12/2013.  FINDINGS: The patient has a new right PICC in place. The tip is difficult to visualize due to patient rotation but it appears to lie in the mid superior vena cava. Bilateral pleural effusions and basilar airspace disease persist. There is cardiomegaly. No pneumothorax.  IMPRESSION: Tip of right PICC is difficult to visualize due to patient rotation but it appears to lie in the mid superior vena cava.  Persistent bilateral effusions and airspace disease.   Electronically Signed   By: Drusilla Kanner M.D.   On: 09/19/2013 16:50    Scheduled Meds: . carvedilol  3.125 mg Oral BID WC  . ceFEPime (MAXIPIME) IV  1 g Intravenous Q12H  . feeding supplement (ENSURE COMPLETE)  237 mL Oral BID BM  . fluconazole  100 mg Oral Daily  . furosemide  80 mg Oral BID  . insulin aspart  0-9 Units Subcutaneous TID WC  . pantoprazole (PROTONIX) IV  40 mg Intravenous BID  . potassium chloride  10 mEq Oral Daily  . saccharomyces boulardii  250 mg Oral BID  . sodium chloride  10-40 mL Intracatheter Q12H  . 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 If 7PM-7AM, please contact night-coverage at [email or web address], password [facility] 09/21/2013, 1:25 PM  LOS: 3 days

## 2013-09-21 NOTE — Evaluation (Signed)
Occupational Therapy Evaluation Patient Details Name: Finton Quincey MRN: [id number] DOB: 1931/09/17 Today's Date: 09/21/2013 Time: 1533-1600 OT Time Calculation (min): 27 min  OT Assessment / Plan / Recommendation History of present illness pt was admitted for HCAP.  He has a h/o COPD, HTn, DM, pacemaker and CHF   Clinical Impression   Pt was admitted for the above.  He will benefit from skilled OT to increase strength and endurance for adls to maximize participation and decrease burden of care:  Pt did have assistance at home prior to admission, but level of assistance is unknown.    OT Assessment  Patient needs continued OT Services    Follow Up Recommendations  Supervision/Assistance - 24 hour;SNF    Barriers to Discharge      Equipment Recommendations   (possibly 3:1 commode:  to be further assessed)    Recommendations for Other Services    Frequency  Min 2X/week    Precautions / Restrictions Precautions Precautions: Fall Precaution Comments: bil boots for bed, has sacral sore and bil. heel sores. Required Braces or Orthoses: Other Brace/Splint Other Brace/Splint: PRAFO's Restrictions Weight Bearing Restrictions: No   Pertinent Vitals/Pain Pain in buttocks:  Repositioned, not rated.  Reapplied boots in bed. Initially when sitting eob, pt was dizzy.  BP 118/54    ADL  Grooming: Wash/dry face;Set up Where Assessed - Grooming: Supported sitting Upper Body Bathing: Minimal assistance Where Assessed - Upper Body Bathing: Supported sitting Toileting - Clothing Manipulation and Hygiene: +2 Total assistance Toileting - Clothing Manipulation and Hygiene: Patient Percentage: 0% Where Assessed - Toileting Clothing Manipulation and Hygiene: Rolling right and/or left Transfers/Ambulation Related to ADLs: sat eob x 15 minutes with initial min A to min guard.  Pt c/o dizziness initially.  Pt states he performs pivot to w/c with assist but doesn't sit long.  Decub on sacrum.   Repositioned onto side and reapplied boots ADL Comments: Pt had help at home for all adls.  A x 2 for LB adls    OT Diagnosis: Generalized weakness  OT Problem List: Decreased strength;Decreased activity tolerance;Impaired balance (sitting and/or standing);Pain;Cardiopulmonary status limiting activity OT Treatment Interventions: Self-care/ADL training;DME and/or AE instruction;Patient/family education;Therapeutic activities   OT Goals(Current goals can be found in the care plan section) Acute Rehab OT Goals Patient Stated Goal: get home to wife OT Goal Formulation: With patient Time For Goal Achievement: 10/05/13 Potential to Achieve Goals: Fair ADL Goals Pt Will Transfer to Toilet: with max assist;bedside commode;stand pivot transfer;squat pivot transfer Additional ADL Goal #1: pt will roll to bil sides for adls with mod A Additional ADL Goal #2: pt will sit eob x 15 minutes with supervision for light adl task to increase strength and endurance for adls/transfers  Visit Information  Last OT Received On: 09/21/13 Assistance Needed: +2 PT/OT/SLP Co-Evaluation/Treatment: Yes Reason for Co-Treatment: For patient/therapist safety PT goals addressed during session: Mobility/safety with mobility;Balance;Strengthening/ROM OT goals addressed during session: ADL's and self-care;Strengthening/ROM History of Present Illness: pt was admitted for HCAP.  He has a h/o COPD, HTn, DM, pacemaker and CHF       Prior Functioning     Home Living Family/patient expects to be discharged to:: Unsure Prior Function Level of Independence: Needs assistance Comments: hired caregiver for adls for both pt and spouse Communication Communication: No difficulties         Vision/Perception     Copywriter, advertising Arousal/Alertness: Awake/alert Behavior During Therapy: WFL for tasks assessed/performed Overall Cognitive Status: Within Functional Limits for tasks  assessed    Extremity/Trunk  Assessment Upper Extremity Assessment Upper Extremity Assessment: Generalized weakness     Mobility Bed Mobility Overal bed mobility: Needs Assistance;+2 for physical assistance Bed Mobility: Sidelying to Sit Sidelying to sit: +2 for physical assistance;Max assist General bed mobility comments: assist for legs and trunk     Exercise     Balance Balance Overall balance assessment: Needs assistance Sitting balance - Comments: min A initially; min guard through session    End of Session OT - End of Session Activity Tolerance: Patient tolerated treatment well Patient left: in bed;with call bell/phone within reach;with bed alarm set  GO     Casady Voshell 09/21/2013, 4:37 PM Marica OtterMaryellen Torez Beauregard, OTR/L [phone] 09/21/2013

## 2013-09-21 NOTE — Progress Notes (Signed)
Report given to [facility]. Patient is stable at transfer. Wife notified of room change.

## 2013-09-21 NOTE — Evaluation (Signed)
Physical Therapy Evaluation Patient Details Name: Frederick [name] MRN: [id number] DOB: 28-Dec-1931 Today's Date: 09/21/2013 Time: 9735-3299 PT Time Calculation (min): 30 min  PT Assessment / Plan / Recommendation History of Present Illness  pt was admitted for HCAP.  He has a h/o COPD, HTn, DM, pacemaker and CHF  Clinical Impression  Pt  Is very frail, did participate in sitting at the edge of bed. Pt has malodorous sacral decubitus. RN to obtain an air overlay mattress. No family present to discuss DC plans, per chart , wife wants pt home. Pt will require  24 hour caregivers. Pt will benefit from trial of PT to improve functional mobility and  Decrease caregiver burden of care.    PT Assessment  Patient needs continued PT services    Follow Up Recommendations  SNF;Supervision/Assistance - 24 hour    Does the patient have the potential to tolerate intense rehabilitation      Barriers to Discharge Decreased caregiver support      Equipment Recommendations       Recommendations for Other Services     Frequency Min 3X/week    Precautions / Restrictions Precautions Precautions: Fall Precaution Comments: bil boots for bed, has sacral sore and bil. heel sores. Required Braces or Orthoses: Other Brace/Splint Other Brace/Splint: PRAFO's Restrictions Weight Bearing Restrictions: No   Pertinent Vitals/Pain Difficult to register sats, appeared dropping in sitting so O2 reapplied at 2 liters with sats at 92%. RN aware.      Mobility  Bed Mobility Overal bed mobility: Needs Assistance;+2 for physical assistance Bed Mobility: Sidelying to Sit;Sit to Supine Sidelying to sit: +2 for physical assistance;Max assist Sit to supine: +2 for physical assistance;Total assist General bed mobility comments: assist for legs and trunk    Exercises     PT Diagnosis: Generalized weakness  PT Problem List: Decreased strength;Decreased activity tolerance;Decreased balance;Decreased mobility;Decreased  knowledge of use of DME;Decreased safety awareness;Decreased knowledge of precautions;Decreased skin integrity PT Treatment Interventions: Functional mobility training;Therapeutic activities;Therapeutic exercise;Patient/family education     PT Goals(Current goals can be found in the care plan section) Acute Rehab PT Goals Patient Stated Goal: get home to wife PT Goal Formulation: Patient unable to participate in goal setting Time For Goal Achievement: 10/05/13 Potential to Achieve Goals: Fair  Visit Information  Last PT Received On: 09/21/13 Assistance Needed: +2 PT/OT/SLP Co-Evaluation/Treatment: Yes Reason for Co-Treatment: For patient/therapist safety PT goals addressed during session: Mobility/safety with mobility;Balance;Strengthening/ROM OT goals addressed during session: ADL's and self-care;Strengthening/ROM History of Present Illness: pt was admitted for HCAP.  He has a h/o COPD, HTn, DM, pacemaker and CHF       Prior Functioning  Home Living Family/patient expects to be discharged to:: Unsure Living Arrangements: Spouse/significant other Prior Function Level of Independence: Needs assistance Comments: hired caregiver for adls for both pt and spouse Communication Communication: No difficulties    Cognition  Cognition Arousal/Alertness: Awake/alert Behavior During Therapy: WFL for tasks assessed/performed Overall Cognitive Status: No family/caregiver present to determine baseline cognitive functioning    Extremity/Trunk Assessment Upper Extremity Assessment Upper Extremity Assessment: Generalized weakness Lower Extremity Assessment Lower Extremity Assessment: Generalized weakness Cervical / Trunk Assessment Cervical / Trunk Assessment: Kyphotic   Balance Balance Overall balance assessment: Needs assistance Sitting-balance support: Bilateral upper extremity supported;Feet supported Sitting balance-Leahy Scale: Poor Sitting balance - Comments: initially listing,  gradually gained some control with min guard asist. sat x 10 minutes. Postural control: Posterior lean;Left lateral lean  End of Session PT - End of Session Activity  Tolerance: Patient limited by fatigue Patient left: in bed;with call bell/phone within reach;with bed alarm set Nurse Communication: Mobility status (sats may be low, O2 replaced.)  GP     [name], [name] 09/21/2013, 4:41 PM

## 2013-09-22 DIAGNOSIS — J189 Pneumonia, unspecified organism: Secondary | ICD-10-CM

## 2013-09-22 DIAGNOSIS — N179 Acute kidney failure, unspecified: Secondary | ICD-10-CM

## 2013-09-22 DIAGNOSIS — I498 Other specified cardiac arrhythmias: Secondary | ICD-10-CM

## 2013-09-22 DIAGNOSIS — D649 Anemia, unspecified: Secondary | ICD-10-CM

## 2013-09-22 DIAGNOSIS — I10 Essential (primary) hypertension: Secondary | ICD-10-CM

## 2013-09-22 DIAGNOSIS — J90 Pleural effusion, not elsewhere classified: Secondary | ICD-10-CM

## 2013-09-22 LAB — BASIC METABOLIC PANEL
BUN: 52 mg/dL — ABNORMAL HIGH (ref 6–23)
CALCIUM: 8.2 mg/dL — AB (ref 8.4–10.5)
CO2: 44 mEq/L (ref 19–32)
Chloride: 95 mEq/L — ABNORMAL LOW (ref 96–112)
Creatinine, Ser: 1.31 mg/dL (ref 0.50–1.35)
GFR calc Af Amer: 58 mL/min — ABNORMAL LOW (ref >90)
GFR calc non Af Amer: 50 mL/min — ABNORMAL LOW (ref >90)
GLUCOSE: 90 mg/dL (ref 70–99)
Potassium: 3.9 mEq/L (ref 3.7–5.3)
SODIUM: 141 meq/L (ref 137–147)

## 2013-09-22 LAB — GLUCOSE, CAPILLARY
GLUCOSE-CAPILLARY: 31 mg/dL — AB (ref 70–99)
GLUCOSE-CAPILLARY: 103 mg/dL — AB (ref 70–99)
GLUCOSE-CAPILLARY: 69 mg/dL — AB (ref 70–99)
GLUCOSE-CAPILLARY: 98 mg/dL (ref 70–99)
Glucose-Capillary: 91 mg/dL (ref 70–99)
Glucose-Capillary: 103 mg/dL — ABNORMAL HIGH (ref 70–99)

## 2013-09-22 LAB — CBC
HCT: 32.1 % — ABNORMAL LOW (ref 39.0–52.0)
Hemoglobin: 10 g/dL — ABNORMAL LOW (ref 13.0–17.0)
MCH: 28.3 pg (ref 26.0–34.0)
MCHC: 31.2 g/dL (ref 30.0–36.0)
MCV: 90.9 fL (ref 78.0–100.0)
PLATELETS: 94 10*3/uL — AB (ref 150–400)
RBC: 3.53 MIL/uL — ABNORMAL LOW (ref 4.22–5.81)
RDW: 19.4 % — ABNORMAL HIGH (ref 11.5–15.5)
WBC: 9.9 10*3/uL (ref 4.0–10.5)

## 2013-09-22 MED ORDER — LEVOFLOXACIN 750 MG PO TABS
750.0000 mg | ORAL_TABLET | ORAL | Status: DC
  Administered 2013-09-22 – 2013-09-26 (×2): 750 mg via ORAL
  Filled 2013-09-22 (×3): qty 1

## 2013-09-22 MED ORDER — DEXTROSE 5 % IV SOLN: 1.0000 g | INTRAVENOUS | Status: DC

## 2013-09-22 NOTE — Progress Notes (Signed)
CRITICAL VALUE ALERT  Critical value received:  CO2 44  Date of notification:  09/22/13  Time of notification:  0508  Critical value read back:yes  Nurse who received alert:  Radene Knee  MD notified (1st page):  Schorr  Time of first page:  0511  MD notified (2nd page): Schorr  Time of second page: [phone]  Responding MD:  No response  Time MD responded:  No response

## 2013-09-22 NOTE — Progress Notes (Signed)
Hypoglycemic Event  CBG: 69  Treatment: 15 GM carbohydrate snack  Symptoms: Hungry  Follow-up CBG: Time:1208 CBG Result:103  Possible Reasons for Event: Inadequate meal intake (patient did not eat much of breakfast)  Comments/MD notified:Dr. Mahala Menghini    Glen Kesinger, Joslyn Devon  Remember to initiate Hypoglycemia Order Set & complete

## 2013-09-22 NOTE — Progress Notes (Signed)
ANTIBIOTIC CONSULT NOTE - Follow up  Pharmacy Consult for Vancomycin & Renally dose adjusted antibiotics Indication: Pneumonia  No Known Allergies  Patient Measurements: Height: 5\' 7"  (170.2 cm) Weight: 130 lb 4.8 oz (59.104 kg) IBW/kg (Calculated) : 66.1  Vital Signs: Temp: 97.6 F (36.4 C) (02/18 1342) Temp src: Oral (02/18 1342) BP: 134/57 mmHg (02/18 1342) Pulse Rate: 75 (02/18 1342) Intake/Output from previous day: 02/17 0701 - 02/18 0700 In: 1100 [P.O.:600; I.V.:10; IV Piggyback:250] Out: 701 [Urine:701]  Labs:  Recent Labs  09/20/13 0530 09/21/13 0430 09/22/13 0405  WBC 11.8* 12.8* 9.9  HGB 6.6* 9.5* 10.0*  PLT 109* 98* 94*  CREATININE 1.27 1.32 1.31   Estimated Creatinine Clearance: 38.2 ml/min (by C-G formula based on Cr of 1.31).  Assessment: 80 yr male with weakness and shortness of breath. Noted sacral ulcers and wounds to both heels. CT shows possible infiltrate  2/15>> Zosyn x 1 in ED >> 2/15 2/15 >>vanc x 8 days >> 2/15 >>cefepime x 8 days >>  2/16 >> Fluconazole (MD, thrush) >>  Tmax: AF WBCs: 9.9 Renal: Scr 1.31, CrCl ~ 38 ml/min (C-G) and 39ml/min (N)  2/15 MRSA: negative 2/15 blood x 2: NGTD 2/15 sputum: Ordered 2/15 Influenza PCR: negative 2/15 S. pneumo and Legionella urinary Ag: neg  Drug level / dose changes info: 2/16 Empiric dose change, vanc 750 q24h and cefepime 1g q12h 2/19: VT = ___mcg/ml on vanco 750mg  IV q24h (prior to 3rd dose, prior to 5th overall dose)   Goal of Therapy:  Vancomycin trough level 15-20 mcg/ml Appropriate abx dosing, eradication of infection.   Plan:  Day #4 vancomycin/cefepime  Cefepime 2g IV q24h  Vancomycin 750mg  IV q24h, check vancomycin trough in am  Juliette Alcide, PharmD, BCPS.   Pager: [phone] 09/22/2013 2:29 PM

## 2013-09-22 NOTE — Progress Notes (Signed)
Brief Pharmacy Note: Levofloxacin  Treatment of suspected HCAP with Vancomycin/Cefepime x 4 days, improved. Narrow abx treatment to Levaquin po  Begin Levaquin 750mg  po q48hr  Pharmacy will sign off, no monitoring needed.  Thank you,  Otho Bellows PharmD Pager [phone] 09/22/2013, 6:19 PM

## 2013-09-22 NOTE — Progress Notes (Signed)
[facility] PROGRESS NOTE  Frederick EndsRobert [name] ZOX:[id number] DOB: 01/15/1932 DOA: 09/28/2013 PCP: Arvella NighADCOCK, JIMMIE, MD  Brief Summary  80 y/o ? PMH htn, DM ty 2, PPM insertion bradycardia, severe NICM  EF of 15-20%, who has had 4 hospitalizations in the last month + rapid decline.  [facility] at the request of his debilitated wife who also has a caregiver, however, he was severely deconditioned and probably needed SNF.  He was unable to care for himself and he had previously been her caretaker.  He complained of increasing pain on his sacrum from his large stage III sacral decub so he returned to the hospital.  He is being tx for HCAP, assessing fluid status.  He required transfusion of blood due to GI losses, but he is not a good candidate for colo or surgery.  Per wife, [facility]  Given his heart failure, weight loss, severe malnutrition, rapid decline, recurrent anemia, he would be a good candidate for palliative care, however, the wife has repeatedly declined.  He is DNR.  He is recommended for SNF, however, the wife again wants him to be discharged to an apartment in [place] and she would like to arrange for 24 hour aid assistance for both of them.  Social work and case management are assisting.    Assessment/Plan  Acute hypoxic respiratory failure due to HCAP (healthcare-associated pneumonia) vs. atelectasis.  CTa negative for PE.   -  BCx NGTD -  Flu neg -  S. pneumo neg -  Legionella neg -  Narrow IV vancomycin and IV cefepime 2/18 to Levaquin PO, and reassess WBC in am and fever curve  Lethargy and confusion, may be related to infection or heart failure, happens sporadically and resolves spontaneously -  ABG with mild hypercapnea, however, pH elevated.  Suspect chronic CO2 retention -  Ammonia 25 -  Continue tx for HCAP  Chronic systolic heart failure with severe NICM EF 15-20% s/p pacemaker and recent admission for heart  failure. Discharge weight of 126-lbs and currently 130lbs.  Could have lost muscle/fat weight and still be volume overloaded, however.  Has pronounced JVP and effusions on CXR, however, no overt pulmonary edema.  proBNP > 70K.   -  continue lasix to 80mg  BID  -  Net positive 2 L -  Start low dose daily potassium repletion  AKI, -  FENa:  Prerenal  -suspect this is new baseline 2/2 to need for diuresis  Blood leaking from urethra likely due to I/O cath trauma, resolving -  Spoke with urology Dr. Jolyne LoaBelsante regarding management who recommended avoiding cath unless he has evidence of obstruction.  If obstructed, place coude catheter and leave in for approximately 2 days.   -  Nurse to monitor uop and obtain PVR if uop decreases.  If increasing residuals, notify MD for orders  Hypertension, at risk for hypotension -  Restart coreg 3.125 BID -  Hold ACEI due to AKI  T2DM, hypoglycemic A1c 9.2 last month -Discontinue all insulin -  D/c levemir   Bilateral hip and knee pain, worse than baseline -  X-rays of hip and knee:  No fracture  Left arm basilic vein thrombus -  Wife declines a/c b/c she feels it caused the large bruise of his right side  Deconditioning due to heart failure, recurrent infections, severe protein calorie malnutrition -  PT/OT assessments pending  Multiple pressure ulcers, feet, sacrum, back -  Appreciate wound care recs -  Air mattress  -  booties  Severe protein calorie malnutrition -  Supplements, liberalized diet  Leukocytosis, likely related to infection, stable, unimproved since admission -  Trend WBC -  Continue abx  Normocytic anemia, recently received blood transfusion of 2 units from [place], occult stool positive.  Patient is not a good candidate for surgery or colonoscopy given diminished ACOG status -  Iron studies consistent with anemia of chronic disease but given occult positive stool, may be mixed picture -  Feraheme x 1  -  b12 1004 folate wnl, TSH  0.834 -  Occult stool positive -  S/p 2 units PRBC 09/20/13  Thrombocytopenia, acute phase reactant  Hypokalemia due to malnutrition, replete with oral potassium  Hypothermia due to severe heart failure and possible sepsis -  Warming blanket if needed  Thrush:  Continue oral fluconazole once daily, stop 2/26  Diet:  Dysphagia 1 Access:  PICC IVF:  yes Proph:  lovenox  Code Status: DNR Family Communication: spoke with patient and wife Disposition Plan:  PT/OT evaluations.  Needs stable CBG.  Eligible for hospice, however, family declining.  Consultants:  None  Procedures:  CXR  CT anio  XR knee right  XR bilateral hips  Antibiotics:  Vancomycin 2/15-2/18  Zosyn x 1 2/15  Cefepime 2/15 >> 2/18  Levaquin 2/18>>>  Fluconazole 2/16>>>  HPI/Subjective:  Tired but much more alert per RN Oriented Tol diet poorly NO other c/o Understands situation is grim  Objective: Filed Vitals:   09/21/13 1246 09/21/13 2057 09/22/13 0627 09/22/13 1342  BP: 127/45 116/51 151/59 134/57  Pulse: 117 70 117 75  Temp: 97.8 F (36.6 C) 98.5 F (36.9 C) 97.9 F (36.6 C) 97.6 F (36.4 C)  TempSrc: Oral Oral Oral Oral  Resp: 16 16 16 16   Height:      Weight: 59.33 kg (130 lb 12.8 oz)  59.104 kg (130 lb 4.8 oz)   SpO2: 92% 93% 100% 96%    Intake/Output Summary (Last 24 hours) at 09/22/13 1649 Last data filed at 09/22/13 1300  Gross per 24 hour  Intake   1030 ml  Output   1001 ml  Net     29 ml   Filed Weights   09/21/13 0441 09/21/13 1246 09/22/13 0627  Weight: 57.5 kg (126 lb 12.2 oz) 59.33 kg (130 lb 12.8 oz) 59.104 kg (130 lb 4.8 oz)    Exam:   General:  Cachectic BM, No acute distress  HEENT:  NCAT, MMM, JVP to preauricular area   Cardiovascular:  Distant IRRR, nl S1, S2, + gallop, 1+ pulses, cool extremities  Respiratory:   Diminished bilateral BS, no wheezes or rhonchi, no increased WOB  Abdomen:   NABS, soft, NT/ND  MSK:   Decreased tone and bulk,  no LEE, bilateral arms with 1+ nonpitting edema   Neuro:  Grossly intact  Skin:  Not reviewed today  Data Reviewed: Basic Metabolic Panel:  Recent Labs Lab 09/19/13 0100 09/20/13 0530 09/21/13 0430 09/21/13 0825 09/22/13 0405  NA 146 144 146  --  141  K 3.4* 3.2* 3.5*  --  3.9  CL 93* 94* 99  --  95*  CO2 >45* 45* >45*  --  44*  GLUCOSE 219* 328* 32* 51* 90  BUN 57* 53* 50*  --  52*  CREATININE 1.41* 1.27 1.32  --  1.31  CALCIUM 8.6 7.8* 7.9*  --  8.2*   Liver Function Tests:  Recent  Labs Lab 09/19/13 0100  AST 29  ALT 20  ALKPHOS 91  BILITOT 1.1  PROT 6.0  ALBUMIN 2.6*   No results found for this basename: LIPASE, AMYLASE,  in the last 168 hours  Recent Labs Lab 09/19/13 1804  AMMONIA 25   CBC:  Recent Labs Lab 09/19/13 0100 09/20/13 0530 09/21/13 0430 09/22/13 0405  WBC 12.8* 11.8* 12.8* 9.9  NEUTROABS 11.3*  --   --   --   HGB 7.9* 6.6* 9.5* 10.0*  HCT 26.5* 21.3* 29.6* 32.1*  MCV 88.9 90.3 88.1 90.9  PLT 138* 109* 98* 94*   Cardiac Enzymes:  Recent Labs Lab 09/19/13 0100  TROPONINI <0.30   BNP (last 3 results)  Recent Labs  08/06/13 1121 08/12/13 1649 09/19/13 0100  PROBNP >70000.0* 61891.0* >70000.0*   CBG:  Recent Labs Lab 09/21/13 1736 09/21/13 2204 09/22/13 0735 09/22/13 1139 09/22/13 1205  GLUCAP 179* 171* 91 69* 103*    Recent Results (from the past 240 hour(s))  CULTURE, BLOOD (ROUTINE X 2)     Status: None   Collection Time    09/19/13  5:05 AM      Result Value Ref Range Status   Specimen Description BLOOD RIGHT WRIST   Final   Special Requests BOTTLES DRAWN AEROBIC AND ANAEROBIC 5CC   Final   Culture  Setup Time     Final   Value: 09/19/2013 13:28     Performed at [facility]   Culture     Final   Value:        BLOOD CULTURE RECEIVED NO GROWTH TO DATE CULTURE WILL BE HELD FOR 5 DAYS BEFORE ISSUING A FINAL NEGATIVE REPORT     Performed at [facility]   Report Status PENDING   Incomplete   CULTURE, BLOOD (ROUTINE X 2)     Status: None   Collection Time    09/19/13  5:18 AM      Result Value Ref Range Status   Specimen Description BLOOD BLOOD RIGHT FOREARM   Final   Special Requests BOTTLES DRAWN AEROBIC ONLY 4CC   Final   Culture  Setup Time     Final   Value: 09/19/2013 13:28     Performed at [facility]   Culture     Final   Value:        BLOOD CULTURE RECEIVED NO GROWTH TO DATE CULTURE WILL BE HELD FOR 5 DAYS BEFORE ISSUING A FINAL NEGATIVE REPORT     Performed at [facility]   Report Status PENDING   Incomplete  MRSA PCR SCREENING     Status: None   Collection Time    09/19/13  6:37 AM      Result Value Ref Range Status   MRSA by PCR NEGATIVE  NEGATIVE Final   Comment:            The GeneXpert MRSA Assay (FDA     approved for NASAL specimens     only), is one component of a     comprehensive MRSA colonization     surveillance program. It is not     intended to diagnose MRSA     infection nor to guide or     monitor treatment for     MRSA infections.     Studies: No results found.  Scheduled Meds: . carvedilol  3.125 mg Oral BID WC  . [START ON 09/23/2013] ceFEPime (MAXIPIME) IV  1 g Intravenous Q24H  .  feeding supplement (ENSURE COMPLETE)  237 mL Oral BID BM  . fluconazole  100 mg Oral Daily  . furosemide  80 mg Oral BID  . pantoprazole  40 mg Oral BID AC  . potassium chloride  10 mEq Oral Daily  . saccharomyces boulardii  250 mg Oral BID  . sodium chloride  10-40 mL Intracatheter Q12H  . 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

## 2013-09-22 NOTE — Progress Notes (Deleted)
Pt changed his mind concerning home health. Wife Lisa cell number, ([phone]). Pt and wife selected [facility] for HHPT. Referral given to in house rep with [facility].  

## 2013-09-22 NOTE — Progress Notes (Signed)
Pt's wife called CM office today .  This CM returned her [phone]).  Pt's wife has not given a straight answer of where the pt will live or who would help her with pt's care at discharge. Spoke with pt's son Frederick [name] to asked where pt will live. There are no concrete answers to where pt will discharge to/live or who will assist with pt's care.  This CM asked son and wife to talk about disposition together, call me back with that information.  During the pt's last hospitalization 08/17/13, the wife states, she and the pt were moving to [place] area, no address, however they stayed with their son Frederick [name] in [place], [place].  Pt's wife, on last hospitalization 08/17/13 selected [facility].  A call to [name] Furbish [phone]) on today 09/22/13 revealed that case was not opened related to pt not having a PCP and wife not allowing [facility] to come in to assess the pt.  [name] Furbish emailed pt's son Frederick [name],  information on how to established a PCP, names of PCP's taking new pt's in the [place] area, along with 2  Physician practices that made home visits list. CM will continue to follow pt for discharge needs.

## 2013-09-22 NOTE — Progress Notes (Deleted)
CRITICAL VALUE ALERT  Critical value received:  CO2  Date of notification:  09/22/2013   Time of notification: 0508  Critical value read back:yes  Nurse who received alert:  Radene Knee  MD notified (1st page):  Schorr  Time of first page:  0511  MD notified (2nd page): Schorr  Time of second page: [phone]  Responding MD:  N/A  Time MD responded:  No response

## 2013-09-23 LAB — GLUCOSE, CAPILLARY
GLUCOSE-CAPILLARY: 83 mg/dL (ref 70–99)
Glucose-Capillary: 110 mg/dL — ABNORMAL HIGH (ref 70–99)
Glucose-Capillary: 176 mg/dL — ABNORMAL HIGH (ref 70–99)
Glucose-Capillary: 182 mg/dL — ABNORMAL HIGH (ref 70–99)

## 2013-09-23 LAB — CBC
HCT: 34.4 % — ABNORMAL LOW (ref 39.0–52.0)
HEMOGLOBIN: 10.6 g/dL — AB (ref 13.0–17.0)
MCH: 28.3 pg (ref 26.0–34.0)
MCHC: 30.8 g/dL (ref 30.0–36.0)
MCV: 92 fL (ref 78.0–100.0)
Platelets: 118 10*3/uL — ABNORMAL LOW (ref 150–400)
RBC: 3.74 MIL/uL — AB (ref 4.22–5.81)
RDW: 19.9 % — ABNORMAL HIGH (ref 11.5–15.5)
WBC: 11.8 10*3/uL — ABNORMAL HIGH (ref 4.0–10.5)

## 2013-09-23 LAB — COMPREHENSIVE METABOLIC PANEL
ALK PHOS: 92 U/L (ref 39–117)
ALT: 16 U/L (ref 0–53)
AST: 22 U/L (ref 0–37)
Albumin: 2.3 g/dL — ABNORMAL LOW (ref 3.5–5.2)
BILIRUBIN TOTAL: 1.2 mg/dL (ref 0.3–1.2)
BUN: 58 mg/dL — ABNORMAL HIGH (ref 6–23)
CHLORIDE: 92 meq/L — AB (ref 96–112)
CO2: 38 meq/L — AB (ref 19–32)
CREATININE: 1.51 mg/dL — AB (ref 0.50–1.35)
Calcium: 8.9 mg/dL (ref 8.4–10.5)
GFR calc Af Amer: 49 mL/min — ABNORMAL LOW (ref >90)
GFR calc non Af Amer: 42 mL/min — ABNORMAL LOW (ref >90)
Glucose, Bld: 76 mg/dL (ref 70–99)
POTASSIUM: 4.6 meq/L (ref 3.7–5.3)
Sodium: 142 mEq/L (ref 137–147)
Total Protein: 5.8 g/dL — ABNORMAL LOW (ref 6.0–8.3)

## 2013-09-23 LAB — PROCALCITONIN: Procalcitonin: 0.31 ng/mL

## 2013-09-23 MED ORDER — SPIRONOLACTONE 12.5 MG HALF TABLET
12.5000 mg | ORAL_TABLET | Freq: Every day | ORAL | Status: DC
  Administered 2013-09-23 – 2013-09-24 (×2): 12.5 mg via ORAL
  Filled 2013-09-23 (×2): qty 1

## 2013-09-23 MED ORDER — SODIUM CHLORIDE 0.9 % IV SOLN
INTRAVENOUS | Status: DC
  Administered 2013-09-23: 23:00:00 via INTRAVENOUS

## 2013-09-23 NOTE — Progress Notes (Signed)
[facility] PROGRESS NOTE  Gillis EndsRobert Hartman ZOX:[id number] DOB: 07/15/1932 DOA: 2013/12/12 PCP: Arvella NighADCOCK, JIMMIE, MD  Brief Summary  80 y/o ? PMH htn, DM ty 2, PPM insertion bradycardia, severe NICM  EF of 15-20%, who has had 4 hospitalizations in the last month + rapid decline.  He was discharged [facility] the [facility] to home at the request of his debilitated wife who also has a caregiver, however, he was severely deconditioned and probably needed SNF.  He was unable to care for himself and he had previously been her caretaker.  He complained of increasing pain on his sacrum from his large stage III sacral decub so he returned to the hospital.  He is being tx for HCAP, assessing fluid status.  He required transfusion of blood due to GI losses, but he is not a good candidate for colo or surgery.  Per wife, [facility]  Given his heart failure, weight loss, severe malnutrition, rapid decline, recurrent anemia, he would be a good candidate for palliative care, however, the wife has repeatedly declined.  He is DNR.  He is recommended for SNF, however, the wife again wants him to be discharged to an apartment in [place] and she would like to arrange for 24 hour aid assistance for both of them.  Social work and case management are assisting.     multiple discussions with wife regarding exceedingly poor prognosis and high likelihood to be readmitted for compensated heart failure-patient's wife still wishes to take the patient home as she has had bad feelings and notions about nursing home care   Assessment/Plan  Acute hypoxic respiratory failure due to HCAP (healthcare-associated pneumonia) vs. atelectasis.  CTA negative for PE.   -  BCx NGTD -  Flu neg -  S. pneumo neg -  Legionella neg -  Narrow IV vancomycin and IV cefepime 2/18 to Levaquin PO and reassess WBC in am and fever curve -Antibiotics stop date 09/27/13=10 days for Hcap  Lethargy and confusion, may be related to  infection or heart failure, happens sporadically and resolves spontaneously. -  ABG with mild hypercapnea, however, pH elevated.  Suspect chronic CO2 retention -  Ammonia 25  Chronic systolic heart failure with severe NICM EF 15-20% s/p pacemaker and recent admission for heart failure. Discharge weight of 126-lbs and currently 130lbs.  Could have lost muscle/fat weight and still be volume overloaded, however.  Has pronounced JVP and effusions on CXR, however, no overt pulmonary edema.  proBNP > 70K.   -  continue lasix to 80mg  BID - added aldactone 12.5 mg given EF depressed on 2/19 -  Net positive 2.5 -  Start low dose daily potassium repletion  AKI, -  FENa:  Prerenal  -suspect this is new baseline 2/2 to need for diuresis -Comfort being the ultimate goal, Would diurese at expense of Kidney fucntion  Blood leaking from urethra likely due to I/O cath trauma, resolving -  Spoke with urology Dr. Jolyne LoaBelsante regarding management who recommended avoiding cath unless he has evidence of obstruction.  If obstructed, place coude catheter and leave in for approximately 2 days.   -  Nurse to monitor uop and obtain PVR if uop decreases.  If increasing residuals, notify MD for orders  Hypertension, at risk for hypotension -  Restart coreg 3.125 BID -  Hold ACEI due to AKI  T2DM, hypoglycemic A1c 9.2 last month -Discontinue all insulin give abyssmal appetite -Monitor am sugar for hypoglycemia -  D/c  levemir   Bilateral hip and knee pain, worse than baseline -  X-rays of hip and knee:  No fracture  Left arm basilic vein thrombus -  Wife declines a/c b/c she feels it caused the large bruise of his right side  Deconditioning due to heart failure, recurrent infections, severe protein calorie malnutrition -  PT/OT assessments consistently recommned SNF  Multiple pressure ulcers, feet, sacrum, back -  Appreciate wound care recs -  Air mattress  -  Booties -see pictures above  Severe protein  calorie malnutrition -  Supplements, liberalized diet  Leukocytosis, likely related to infection, stable, unimproved since admission -  Trend WBC -  Continue abx  Normocytic anemia, recently received blood transfusion of 2 units from [place], occult stool positive.  Patient is not a good candidate for surgery or colonoscopy given diminished ACOG status -  Iron studies consistent with anemia of chronic disease but given occult positive stool, may be mixed picture -  Feraheme x 1  -  b12 1004 folate wnl, TSH 0.834 -  Occult stool positive -  S/p 2 units PRBC 09/20/13  Thrombocytopenia, acute phase reactant  Hypokalemia due to malnutrition, replete with oral potassium  Hypothermia due to severe heart failure  -  Warming blanket if needed  Thrush:  Continue oral fluconazole once daily, stop 2/26  Diet:  Dysphagia 1 Access:  PICC IVF:  yes Proph:  lovenox  Code Status: DNR Family Communication: spoke with patient and wife Disposition Plan:  PT/OT evaluations.  Needs stable CBG.  Eligible for hospice, however, family declining. Likely d/c home with family   Consultants:  None  Procedures:  CXR  CT anio  XR knee right  XR bilateral hips  Antibiotics:  Vancomycin 2/15-2/18  Zosyn x 1 2/15  Cefepime 2/15 >> 2/18  Levaquin 2/18>>>2/21  Fluconazole 2/16>>>  HPI/Subjective:  Tired, dyspnoiec with minimal movement in bed and with eating Oriented Tol diet poorly Understands situation is grim  Objective: Filed Vitals:   09/22/13 1342 09/22/13 2210 09/23/13 0530 09/23/13 1426  BP: 134/57 133/69 135/82 125/81  Pulse: 75 66 120 77  Temp: 97.6 F (36.4 C) 97.7 F (36.5 C) 97.7 F (36.5 C) 97.5 F (36.4 C)  TempSrc: Oral Oral Oral   Resp: 16 16 18 19   Height:      Weight:      SpO2: 96% 96% 95% 96%    Intake/Output Summary (Last 24 hours) at 09/23/13 1605 Last data filed at 09/23/13 1426  Gross per 24 hour  Intake    770 ml  Output    250 ml  Net    520  ml   Filed Weights   09/21/13 0441 09/21/13 1246 09/22/13 0627  Weight: 57.5 kg (126 lb 12.2 oz) 59.33 kg (130 lb 12.8 oz) 59.104 kg (130 lb 4.8 oz)    Exam:   General:  Cachectic BM, No acute distress  HEENT:  NCAT, MMM, JVP to preauricular area   Cardiovascular:  Distant IRRR, nl S1, S2, + gallop, 1+ pulses, cool extremities  Respiratory:   Diminished bilateral BS, no wheezes or rhonchi, no increased WOB  Abdomen:   NABS, soft, NT/ND  MSK:   Decreased tone and bulk, no LEE, bilateral arms with 1+ nonpitting edema   Neuro:  Grossly intact  Skin:  See below             Data Reviewed: Basic Metabolic Panel:  Recent Labs Lab 09/19/13 0100 09/20/13 0530 09/21/13 0430 09/21/13  0825 09/22/13 0405 09/23/13 0520  NA 146 144 146  --  141 142  K 3.4* 3.2* 3.5*  --  3.9 4.6  CL 93* 94* 99  --  95* 92*  CO2 >45* 45* >45*  --  44* 38*  GLUCOSE 219* 328* 32* 51* 90 76  BUN 57* 53* 50*  --  52* 58*  CREATININE 1.41* 1.27 1.32  --  1.31 1.51*  CALCIUM 8.6 7.8* 7.9*  --  8.2* 8.9   Liver Function Tests:  Recent Labs Lab 09/19/13 0100 09/23/13 0520  AST 29 22  ALT 20 16  ALKPHOS 91 92  BILITOT 1.1 1.2  PROT 6.0 5.8*  ALBUMIN 2.6* 2.3*   No results found for this basename: LIPASE, AMYLASE,  in the last 168 hours  Recent Labs Lab 09/19/13 1804  AMMONIA 25   CBC:  Recent Labs Lab 09/19/13 0100 09/20/13 0530 09/21/13 0430 09/22/13 0405 09/23/13 0520  WBC 12.8* 11.8* 12.8* 9.9 11.8*  NEUTROABS 11.3*  --   --   --   --   HGB 7.9* 6.6* 9.5* 10.0* 10.6*  HCT 26.5* 21.3* 29.6* 32.1* 34.4*  MCV 88.9 90.3 88.1 90.9 92.0  PLT 138* 109* 98* 94* 118*   Cardiac Enzymes:  Recent Labs Lab 09/19/13 0100  TROPONINI <0.30   BNP (last 3 results)  Recent Labs  08/06/13 1121 08/12/13 1649 09/19/13 0100  PROBNP >70000.0* 61891.0* >70000.0*   CBG:  Recent Labs Lab 09/22/13 1205 09/22/13 1715 09/22/13 2212 09/23/13 0730 09/23/13 1149  GLUCAP  103* 98 103* 83 110*    Recent Results (from the past 240 hour(s))  CULTURE, BLOOD (ROUTINE X 2)     Status: None   Collection Time    09/19/13  5:05 AM      Result Value Ref Range Status   Specimen Description BLOOD RIGHT WRIST   Final   Special Requests BOTTLES DRAWN AEROBIC AND ANAEROBIC 5CC   Final   Culture  Setup Time     Final   Value: 09/19/2013 13:28     Performed at [facility]   Culture     Final   Value:        BLOOD CULTURE RECEIVED NO GROWTH TO DATE CULTURE WILL BE HELD FOR 5 DAYS BEFORE ISSUING A FINAL NEGATIVE REPORT     Performed at [facility]   Report Status PENDING   Incomplete  CULTURE, BLOOD (ROUTINE X 2)     Status: None   Collection Time    09/19/13  5:18 AM      Result Value Ref Range Status   Specimen Description BLOOD BLOOD RIGHT FOREARM   Final   Special Requests BOTTLES DRAWN AEROBIC ONLY 4CC   Final   Culture  Setup Time     Final   Value: 09/19/2013 13:28     Performed at [facility]   Culture     Final   Value:        BLOOD CULTURE RECEIVED NO GROWTH TO DATE CULTURE WILL BE HELD FOR 5 DAYS BEFORE ISSUING A FINAL NEGATIVE REPORT     Performed at [facility]   Report Status PENDING   Incomplete  MRSA PCR SCREENING     Status: None   Collection Time    09/19/13  6:37 AM      Result Value Ref Range Status   MRSA by PCR NEGATIVE  NEGATIVE Final   Comment:  The GeneXpert MRSA Assay (FDA     approved for NASAL specimens     only), is one component of a     comprehensive MRSA colonization     surveillance program. It is not     intended to diagnose MRSA     infection nor to guide or     monitor treatment for     MRSA infections.     Studies: No results found.  Scheduled Meds: . carvedilol  3.125 mg Oral BID WC  . feeding supplement (ENSURE COMPLETE)  237 mL Oral BID BM  . fluconazole  100 mg Oral Daily  . furosemide  80 mg Oral BID  . levofloxacin  750 mg Oral Q48H  . pantoprazole  40 mg Oral  BID AC  . potassium chloride  10 mEq Oral Daily  . saccharomyces boulardii  250 mg Oral BID  . sodium chloride  10-40 mL Intracatheter Q12H   Continuous Infusions: . 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

## 2013-09-23 NOTE — Progress Notes (Signed)
Physical Therapy Treatment Patient Details Name: Frederick [name] MRN: [id number] DOB: 1932-06-01 Today's Date: 09/23/2013 Time: 1505-6979 PT Time Calculation (min): 27 min  PT Assessment / Plan / Recommendation  History of Present Illness pt was admitted for HCAP.  He has a h/o COPD, HTn, DM, pacemaker and CHF   PT Comments   Will benefit from continued progress, continues to  Progress although slowly.   Follow Up Recommendations  SNF;Supervision/Assistance - 24 hour     Does the patient have the potential to tolerate intense rehabilitation     Barriers to Discharge        Equipment Recommendations  Hospital bed;Wheelchair (measurements PT);Other (comment) (-hoyer)    Recommendations for Other Services    Frequency Min 3X/week   Progress towards PT Goals Progress towards PT goals: Progressing toward goals  Plan Current plan remains appropriate    Precautions / Restrictions Precautions Precautions: Fall Precaution Comments: bil boots for bed, has sacral sore and bil. heel sores. Required Braces or Orthoses: Other Brace/Splint Other Brace/Splint: PRAFO's   Pertinent Vitals/Pain Denies pain    Mobility  Bed Mobility Overal bed mobility: Needs Assistance;+2 for physical assistance Bed Mobility: Supine to Sit;Sit to Supine;Rolling Rolling: Mod assist (with rail) Supine to sit: +2 for physical assistance Sit to supine: +2 for physical assistance;Total assist General bed mobility comments: assist for legs and trunk    Exercises     PT Diagnosis:    PT Problem List:   PT Treatment Interventions:     PT Goals (current goals can now be found in the care plan section) Acute Rehab PT Goals Patient Stated Goal: get home to wife PT Goal Formulation: Patient unable to participate in goal setting Time For Goal Achievement: 10/05/13 Potential to Achieve Goals: Fair  Visit Information  Last PT Received On: 09/23/13 Assistance Needed: +2 History of Present Illness: pt was  admitted for HCAP.  He has a h/o COPD, HTn, DM, pacemaker and CHF    Subjective Data  Patient Stated Goal: get home to wife   Cognition  Cognition Arousal/Alertness: Awake/alert Behavior During Therapy: WFL for tasks assessed/performed Overall Cognitive Status: No family/caregiver present to determine baseline cognitive functioning    Balance  Balance Overall balance assessment: Needs assistance Sitting-balance support: Bilateral upper extremity supported;No upper extremity supported;Feet supported Sitting balance-Leahy Scale: Poor Sitting balance - Comments: pt sat EOB x 12 min, gradually able to support self after about 3 min with cues; sats 90-91% on 2L, worked on breathing, posture while sitting  End of Session PT - End of Session Activity Tolerance: Patient tolerated treatment well Patient left: in bed;with call bell/phone within reach;with bed alarm set Nurse Communication: Mobility status   GP     [facility] 09/23/2013, 5:20 PM

## 2013-09-24 LAB — RENAL FUNCTION PANEL
Albumin: 2.3 g/dL — ABNORMAL LOW (ref 3.5–5.2)
BUN: 78 mg/dL — AB (ref 6–23)
CALCIUM: 8.8 mg/dL (ref 8.4–10.5)
CHLORIDE: 92 meq/L — AB (ref 96–112)
CO2: 36 mEq/L — ABNORMAL HIGH (ref 19–32)
Creatinine, Ser: 1.99 mg/dL — ABNORMAL HIGH (ref 0.50–1.35)
GFR calc Af Amer: 35 mL/min — ABNORMAL LOW (ref >90)
GFR calc non Af Amer: 30 mL/min — ABNORMAL LOW (ref >90)
GLUCOSE: 206 mg/dL — AB (ref 70–99)
Phosphorus: 5.7 mg/dL — ABNORMAL HIGH (ref 2.3–4.6)
Potassium: 5.2 mEq/L (ref 3.7–5.3)
Sodium: 140 mEq/L (ref 137–147)

## 2013-09-24 LAB — GLUCOSE, CAPILLARY
GLUCOSE-CAPILLARY: 216 mg/dL — AB (ref 70–99)
GLUCOSE-CAPILLARY: 229 mg/dL — AB (ref 70–99)
Glucose-Capillary: 319 mg/dL — ABNORMAL HIGH (ref 70–99)
Glucose-Capillary: 395 mg/dL — ABNORMAL HIGH (ref 70–99)

## 2013-09-24 LAB — CBC
HEMATOCRIT: 33.8 % — AB (ref 39.0–52.0)
Hemoglobin: 10.6 g/dL — ABNORMAL LOW (ref 13.0–17.0)
MCH: 28.6 pg (ref 26.0–34.0)
MCHC: 31.4 g/dL (ref 30.0–36.0)
MCV: 91.4 fL (ref 78.0–100.0)
Platelets: 126 10*3/uL — ABNORMAL LOW (ref 150–400)
RBC: 3.7 MIL/uL — ABNORMAL LOW (ref 4.22–5.81)
RDW: 20.4 % — ABNORMAL HIGH (ref 11.5–15.5)
WBC: 10.5 10*3/uL (ref 4.0–10.5)

## 2013-09-24 MED ORDER — INSULIN ASPART 100 UNIT/ML ~~LOC~~ SOLN: 0.0000 [IU] | Freq: Every day | SUBCUTANEOUS | Status: DC

## 2013-09-24 MED ORDER — LEVOFLOXACIN IN D5W 750 MG/150ML IV SOLN
750.0000 mg | Freq: Once | INTRAVENOUS | Status: AC
  Administered 2013-09-24: 750 mg via INTRAVENOUS
  Filled 2013-09-24: qty 150

## 2013-09-24 MED ORDER — INSULIN ASPART 100 UNIT/ML ~~LOC~~ SOLN: 0.0000 [IU] | Freq: Three times a day (TID) | SUBCUTANEOUS | Status: DC

## 2013-09-24 MED ORDER — SODIUM CHLORIDE 0.9 % IV SOLN
INTRAVENOUS | Status: AC
  Administered 2013-09-24: 14:00:00 via INTRAVENOUS

## 2013-09-24 MED ORDER — INSULIN ASPART 100 UNIT/ML ~~LOC~~ SOLN
5.0000 [IU] | Freq: Once | SUBCUTANEOUS | Status: AC
  Administered 2013-09-24: 5 [IU] via SUBCUTANEOUS

## 2013-09-24 MED ORDER — SODIUM CHLORIDE 0.9 % IV BOLUS (SEPSIS)
250.0000 mL | Freq: Once | INTRAVENOUS | Status: AC
  Administered 2013-09-24: 250 mL via INTRAVENOUS

## 2013-09-24 NOTE — Progress Notes (Signed)
CBG 395..Night coverage notified--one time dose of insulin given as ordered. Will recheck CBG. Will cont to monitor. SRP, RN, BSN.

## 2013-09-24 NOTE — Progress Notes (Signed)
[facility] PROGRESS NOTE  Frederick [name] PYY:[id number] DOB: 1932-04-23 DOA: Oct 03, 2013 PCP: Arvella Nigh, MD  Brief Summary  80 y/o ? PMH htn, DM ty 2, PPM insertion bradycardia, severe NICM  EF of 15-20%, who has had 4 hospitalizations in the last month + rapid decline.  He was discharged from the [place] to home at the request of his debilitated wife who also has a caregiver, however, he was severely deconditioned and probably needed SNF.  He was unable to care for himself and he had previously been her caretaker.  He complained of increasing pain on his sacrum from his large stage III sacral decub so he returned to the hospital.  He is being tx for HCAP, assessing fluid status.  He required transfusion of blood due to GI losses, but he is not a good candidate for colo or surgery.  Per wife, he was transfused at the [facility] for same reason.  Given his heart failure, weight loss, severe malnutrition, rapid decline, recurrent anemia, he would be a good candidate for palliative care, however, the wife has repeatedly declined.  He is DNR.  He is recommended for SNF, however, the wife again wants him to be discharged to an apartment in [place] and she would like to arrange for 24 hour aid assistance for both of them.  Social work and case management are assisting.     multiple discussions with wife regarding exceedingly poor prognosis and high likelihood to be readmitted for compensated heart failure-patient's wife still wishes to take the patient home as she has had bad feelings and notions about nursing home care   Assessment/Plan  Acute hypoxic respiratory failure due to HCAP (healthcare-associated pneumonia) vs. atelectasis.  CTA negative for PE.   -  BCx NGTD -  Flu neg -  S. pneumo neg -  Legionella neg -  Narrow IV vancomycin and IV cefepime 2/18 to Levaquin PO and reassess WBC in am and fever curve -Antibiotics stop date 09/27/13=10 days for Hcap  Lethargy and confusion ? to infection or heart  failure-sporadically and resolves spontaneously. -  ABG with mild hypercapnea, however, pH elevated.  Suspect chronic CO2 retention -  Ammonia 25  Chronic systolic heart failure with severe NICM EF 15-20% s/p pacemaker and recent admission for heart failure. Discharge weight of 126-lbs and currently 130lbs.  Could have lost muscle/fat weight and still be volume overloaded, however.  Has pronounced JVP and effusions on CXR, however, no overt pulmonary edema.  proBNP > 70K.   - Held lasix to 80mg  BID - Held aldactone 12.5 mg given volume depletion - Net positive 2.5-difficult situation with cardiorenal syndrome - Start low dose daily potassium repletion  AKI, -  FENa:  Prerenal  -suspect this is new baseline 2/2 to need for diuresis -Comfort being the ultimate goal, Would diurese at expense of Kidney fucntion -Patient I suspect is becoming uremic and I will hold off on further diuresis today  Blood leaking from urethra likely due to I/O cath trauma, resolving -  Spoke with urology Dr. Jolyne Loa regarding management who recommended avoiding cath unless he has evidence of obstruction.  If obstructed, place coude catheter and leave in for approximately 2 days.   -  Nurse to monitor uop and obtain PVR if uop decreases.  If increasing residuals, notify MD for orders  Hypertension, at risk for hypotension -  Restart coreg 3.125 BID -  Hold ACEI due to AKI  T2DM, hypoglycemic A1c 9.2 last month -Discontinue all insulin give  abyssmal appetite -Monitor am sugar for hypoglycemia -  D/c levemir -Liberalize diet patient is taking in less than 50% of all meals   Bilateral hip and knee pain, worse than baseline -  X-rays of hip and knee:  No fracture  Left arm basilic vein thrombus -  Wife declines a/c b/c she feels it caused the large bruise of his right side  Deconditioning due to heart failure, recurrent infections, severe protein calorie malnutrition -  PT/OT assessments consistently  recommned SNF  Multiple pressure ulcers, feet, sacrum, back -  Appreciate wound care recs -  Air mattress  -  Booties -see pictures above  Severe protein calorie malnutrition -  Supplements, liberalized diet  Leukocytosis, likely related to infection, stable, unimproved since admission -  Trend WBC -  Continue abx  Normocytic anemia, recently received blood transfusion of 2 units from [place], occult stool positive.  Patient is not a good candidate for surgery or colonoscopy given diminished ACOG status -  Iron studies consistent with anemia of chronic disease but given occult positive stool, may be mixed picture -  Feraheme x 1  -  b12 1004 folate wnl, TSH 0.834 -  Occult stool positive -  S/p 2 units PRBC 09/20/13  Thrombocytopenia, acute phase reactant  Hypokalemia due to malnutrition, replete with oral potassium  Hypothermia due to severe heart failure  -  Warming blanket if needed  Thrush:  Continue oral fluconazole once daily, stop 2/26  Diet:  Dysphagia 1 Access:  PICC IVF:  yes Proph:  lovenox  Code Status: DNR Family Communication: spoke with patient and wife-long discussion with wife today 2/20 who seems to understand that patient is at the end of his natural life expectancy with severe multiorgan disease. She still adamantly is insistent on taking him home and understand the risks of this. When asked directly regarding CODE STATUS she confirms DO NOT RESUSCITATE but is not sure about rehospitalization. I spent about 20 minutes discussing this specific situation in terms of patient decompensating and she still states that she would have them at least seen and I concur that that is reasonable. Age and has a very poor overall prognosis and I believe his hospice eligible however we will discharge him with the understanding that he is at very high risk for readmission  Consultants:  None  Procedures:  CXR  CT anio  XR knee right  XR bilateral  hips  Antibiotics:  Vancomycin 2/15-2/18  Zosyn x 1 2/15  Cefepime 2/15 >> 2/18  Levaquin 2/18>>>2/21  Fluconazole 2/16>>>  HPI/Subjective:  Weaker more tired and tachypneic at bedside Looks worse than yesterday  Objective: Filed Vitals:   09/23/13 2134 09/24/13 0451 09/24/13 0508 09/24/13 1241  BP: 119/60 121/62  101/61  Pulse: 59 76  76  Temp: 97.7 F (36.5 C) 97.5 F (36.4 C)  97.4 F (36.3 C)  TempSrc: Oral Oral  Axillary  Resp: 18 18  18   Height:      Weight:   58.968 kg (130 lb)   SpO2: 92% 93%  100%    Intake/Output Summary (Last 24 hours) at 09/24/13 1355 Last data filed at 09/24/13 1246  Gross per 24 hour  Intake    820 ml  Output    150 ml  Net    670 ml   Filed Weights   09/21/13 1246 09/22/13 0627 09/24/13 0508  Weight: 59.33 kg (130 lb 12.8 oz) 59.104 kg (130 lb 4.8 oz) 58.968 kg (130 lb)  Exam:   General:  Cachectic BM, No acute distress  HEENT:  NCAT, MMM, JVP to preauricular area   Cardiovascular:  Distant IRRR, nl S1, S2, + gallop, 1+ pulses, cool extremities  Respiratory:   Diminished bilateral BS, no wheezes or rhonchi, no increased WOB  Abdomen:   NABS, soft, NT/ND  MSK:   Decreased tone and bulk, no LEE, bilateral arms with 1+ nonpitting edema   Neuro:  Grossly intact  Skin:  See below             Data Reviewed: Basic Metabolic Panel:  Recent Labs Lab 09/20/13 0530 09/21/13 0430 09/21/13 0825 09/22/13 0405 09/23/13 0520 09/24/13 0540  NA 144 146  --  141 142 140  K 3.2* 3.5*  --  3.9 4.6 5.2  CL 94* 99  --  95* 92* 92*  CO2 45* >45*  --  44* 38* 36*  GLUCOSE 328* 32* 51* 90 76 206*  BUN 53* 50*  --  52* 58* 78*  CREATININE 1.27 1.32  --  1.31 1.51* 1.99*  CALCIUM 7.8* 7.9*  --  8.2* 8.9 8.8  PHOS  --   --   --   --   --  5.7*   Liver Function Tests:  Recent Labs Lab 09/19/13 0100 09/23/13 0520 09/24/13 0540  AST 29 22  --   ALT 20 16  --   ALKPHOS 91 92  --   BILITOT 1.1 1.2  --   PROT  6.0 5.8*  --   ALBUMIN 2.6* 2.3* 2.3*   No results found for this basename: LIPASE, AMYLASE,  in the last 168 hours  Recent Labs Lab 09/19/13 1804  AMMONIA 25   CBC:  Recent Labs Lab 09/19/13 0100 09/20/13 0530 09/21/13 0430 09/22/13 0405 09/23/13 0520 09/24/13 0540  WBC 12.8* 11.8* 12.8* 9.9 11.8* 10.5  NEUTROABS 11.3*  --   --   --   --   --   HGB 7.9* 6.6* 9.5* 10.0* 10.6* 10.6*  HCT 26.5* 21.3* 29.6* 32.1* 34.4* 33.8*  MCV 88.9 90.3 88.1 90.9 92.0 91.4  PLT 138* 109* 98* 94* 118* 126*   Cardiac Enzymes:  Recent Labs Lab 09/19/13 0100  TROPONINI <0.30   BNP (last 3 results)  Recent Labs  08/06/13 1121 08/12/13 1649 09/19/13 0100  PROBNP >70000.0* 61891.0* >70000.0*   CBG:  Recent Labs Lab 09/23/13 1149 09/23/13 1633 09/23/13 2140 09/24/13 0918 09/24/13 1146  GLUCAP 110* 176* 182* 216* 229*    Recent Results (from the past 240 hour(s))  CULTURE, BLOOD (ROUTINE X 2)     Status: None   Collection Time    09/19/13  5:05 AM      Result Value Ref Range Status   Specimen Description BLOOD RIGHT WRIST   Final   Special Requests BOTTLES DRAWN AEROBIC AND ANAEROBIC 5CC   Final   Culture  Setup Time     Final   Value: 09/19/2013 13:28     Performed at [facility]   Culture     Final   Value:        BLOOD CULTURE RECEIVED NO GROWTH TO DATE CULTURE WILL BE HELD FOR 5 DAYS BEFORE ISSUING A FINAL NEGATIVE REPORT     Performed at [facility]   Report Status PENDING   Incomplete  CULTURE, BLOOD (ROUTINE X 2)     Status: None   Collection Time    09/19/13  5:18 AM  Result Value Ref Range Status   Specimen Description BLOOD BLOOD RIGHT FOREARM   Final   Special Requests BOTTLES DRAWN AEROBIC ONLY 4CC   Final   Culture  Setup Time     Final   Value: 09/19/2013 13:28     [facility]   Culture     Final   Value:        BLOOD CULTURE RECEIVED NO GROWTH TO DATE CULTURE WILL BE HELD FOR 5 DAYS BEFORE ISSUING A FINAL  NEGATIVE REPORT     Performed at [facility]   Report Status PENDING   Incomplete  MRSA PCR SCREENING     Status: None   Collection Time    09/19/13  6:37 AM      Result Value Ref Range Status   MRSA by PCR NEGATIVE  NEGATIVE Final   Comment:            The GeneXpert MRSA Assay (FDA     approved for NASAL specimens     only), is one component of a     comprehensive MRSA colonization     surveillance program. It is not     intended to diagnose MRSA     infection nor to guide or     monitor treatment for     MRSA infections.     Studies: No results found.  Scheduled Meds: . carvedilol  3.125 mg Oral BID WC  . feeding supplement (ENSURE COMPLETE)  237 mL Oral BID BM  . fluconazole  100 mg Oral Daily  . furosemide  80 mg Oral BID  . levofloxacin  750 mg Oral Q48H  . pantoprazole  40 mg Oral BID AC  . potassium chloride  10 mEq Oral Daily  . saccharomyces boulardii  250 mg Oral BID  . sodium chloride  10-40 mL Intracatheter Q12H   Continuous Infusions: . sodium chloride 20 mL/hr at 09/23/13 2315  . 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

## 2013-09-24 NOTE — Progress Notes (Signed)
MD returned page. New orders placed. Order carried out. Will continue to monitor patient.

## 2013-09-24 NOTE — Progress Notes (Signed)
Report received from E.Ali,RN. No change in assessment. Frederick [name] 

## 2013-09-24 NOTE — Progress Notes (Signed)
NUTRITION FOLLOW UP  Intervention:   Continue with MagicCup and Ensure supplementation Will continue to monitor  Nutrition Dx:   Inadequate oral intake related to decreased appetite as evidenced by PO intake <75%-ongoing   Goal:   Pt to meet >/= 90% of their estimated nutrition needs    Monitor:   Total protein/energy intake, skin integrity, labs, weights  Assessment:   2/16: -Pt reported poor appetite since Christmas. Diet recall indicated pt skips breakfast, and will try to eat 2 meals per day. Will drink Ensure occasionally. 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  -Positive for weight loss  -Tolerates soft foods at home. RN noted pt refused breakfast and ate small amounts of apple sauce and ice cream.Drank one Ensure. Pt reported to enjoy supplement and will continue with regmien. Has hx of DM2, but has minimal intake of CHO from meals  -Pt also willing to try MagicCup as snacks. Inquired about different foods that would be good for his skin. Encouraged pt to eat protein sources with each meals and continue with supplement intake  -WOC evaluated pt. Noted unstageable bilateral heel and stage 3 sacral pressure ulcers. Expressed concern for nutritional status  2/20: -Pt lethargic and unresponsive during time of follow up -PO intake 0% -Per discussion with RN, pt does not consume any of Dys1 foods. Diet has been largely liquid based-juices, water, Ensure.   -Will drink Ensure supplements. RN has been using MagicCup and applesauce for medication. -MD noted pt good candidate for palliative care with SNF placement, however, family declining at this time -Wound care measures in place for multiple pressure ulcers   Height: Ht Readings from Last 1 Encounters:  09/19/13 5\' 7"  (1.702 m)    Weight Status:   Wt Readings from Last 1 Encounters:  09/24/13 130 lb (58.968  kg)    Estimated needs:  Kcal: 1700-1900  Protein: 75-85 gram  Fluid: >/=1700 ml/daily   Skin: unstageable right heel ulcer, stage 3 left heel ulcer and sacral pressure ulcer   Diet Order: Dysphagia 1   Intake/Output Summary (Last 24 hours) at 09/24/13 0941 Last data filed at 09/24/13 0819  Gross per 24 hour  Intake    700 ml  Output    150 ml  Net    550 ml    Last BM: 2/18   Labs:   Recent Labs Lab 09/22/13 0405 09/23/13 0520 09/24/13 0540  NA 141 142 140  K 3.9 4.6 5.2  CL 95* 92* 92*  CO2 44* 38* 36*  BUN 52* 58* 78*  CREATININE 1.31 1.51* 1.99*  CALCIUM 8.2* 8.9 8.8  PHOS  --   --  5.7*  GLUCOSE 90 76 206*    CBG (last 3)   Recent Labs  09/23/13 1633 09/23/13 2140 09/24/13 0918  GLUCAP 176* 182* 216*    Scheduled Meds: . carvedilol  3.125 mg Oral BID WC  . feeding supplement (ENSURE COMPLETE)  237 mL Oral BID BM  . fluconazole  100 mg Oral Daily  . furosemide  80 mg Oral BID  . levofloxacin  750 mg Oral Q48H  . pantoprazole  40 mg Oral BID AC  . potassium chloride  10 mEq Oral Daily  . saccharomyces boulardii  250 mg Oral BID  . sodium chloride  10-40 mL Intracatheter Q12H  . spironolactone  12.5 mg Oral Daily    Continuous Infusions: .  sodium chloride 20 mL/hr at 09/23/13 2315    Lloyd HugerSarah F Gabriell Daigneault MS RD LDN Clinical Dietitian [phone]

## 2013-09-24 NOTE — Progress Notes (Signed)
OT Cancellation Note  Patient Details Name: Frederick [name] MRN: [id number] DOB: July 03, 1932   Cancelled Treatment:    Reason Eval/Treat Not Completed: Fatigue/lethargy limiting ability to participate.  Pt aroused but did not want to participate in OT at this time.  Will check back later if schedule permits.    Winter Trefz 09/24/2013, 10:15 AM Marica Otter, OTR/L [phone] 09/24/2013

## 2013-09-24 NOTE — Progress Notes (Signed)
Patient has not voided since 1800.  At 0330 I bladder scanned the patient and it showed 81 ml. After bladder scan I instructed pt to void and pt voided 50 ml. MD made aware via text page. Will continue to monitor.

## 2013-09-24 NOTE — Progress Notes (Signed)
Inpatient Diabetes Program Recommendations  AACE/ADA: New Consensus Statement on Inpatient Glycemic Control (2013)  Target Ranges:  Prepandial:   less than 140 mg/dL      Peak postprandial:   less than 180 mg/dL (1-2 hours)      Critically ill patients:  140 - 180 mg/dL   Inpatient Diabetes Program Recommendations Correction (SSI): Noted all correction and basal insulin discontinued d/t hypoglycemia.  Sensitive correction tidwc may be helpful if without Levemir.  Thank you, Lenor Coffin, RN, CNS, Diabetes Coordinator [phone])

## 2013-09-24 NOTE — Progress Notes (Signed)
Bladder scan performed. Result 102cc.  Frederick [name]

## 2013-09-25 ENCOUNTER — Inpatient Hospital Stay (HOSPITAL_COMMUNITY): Payer: Medicare Other

## 2013-09-25 LAB — BASIC METABOLIC PANEL WITH GFR
BUN: 88 mg/dL — ABNORMAL HIGH (ref 6–23)
CO2: 39 meq/L — ABNORMAL HIGH (ref 19–32)
Calcium: 8.2 mg/dL — ABNORMAL LOW (ref 8.4–10.5)
Chloride: 96 meq/L (ref 96–112)
Creatinine, Ser: 2.39 mg/dL — ABNORMAL HIGH (ref 0.50–1.35)
GFR calc Af Amer: 28 mL/min — ABNORMAL LOW (ref >90)
GFR calc non Af Amer: 24 mL/min — ABNORMAL LOW (ref >90)
Glucose, Bld: 290 mg/dL — ABNORMAL HIGH (ref 70–99)
Potassium: 5.4 meq/L — ABNORMAL HIGH (ref 3.7–5.3)
Sodium: 143 meq/L (ref 137–147)

## 2013-09-25 LAB — CBC
HCT: 30.2 % — ABNORMAL LOW (ref 39.0–52.0)
HEMOGLOBIN: 9.5 g/dL — AB (ref 13.0–17.0)
MCH: 28.4 pg (ref 26.0–34.0)
MCHC: 31.5 g/dL (ref 30.0–36.0)
MCV: 90.4 fL (ref 78.0–100.0)
PLATELETS: 84 10*3/uL — AB (ref 150–400)
RBC: 3.34 MIL/uL — ABNORMAL LOW (ref 4.22–5.81)
RDW: 20 % — ABNORMAL HIGH (ref 11.5–15.5)
WBC: 11.8 10*3/uL — AB (ref 4.0–10.5)

## 2013-09-25 LAB — GLUCOSE, CAPILLARY
GLUCOSE-CAPILLARY: 191 mg/dL — AB (ref 70–99)
Glucose-Capillary: 286 mg/dL — ABNORMAL HIGH (ref 70–99)
Glucose-Capillary: 202 mg/dL — ABNORMAL HIGH (ref 70–99)
Glucose-Capillary: 240 mg/dL — ABNORMAL HIGH (ref 70–99)
Glucose-Capillary: 201 mg/dL — ABNORMAL HIGH (ref 70–99)

## 2013-09-25 LAB — CBC WITH DIFFERENTIAL/PLATELET
Basophils Absolute: 0 K/uL (ref 0.0–0.1)
Basophils Relative: 0 % (ref 0–1)
Eosinophils Absolute: 0 K/uL (ref 0.0–0.7)
Eosinophils Relative: 0 % (ref 0–5)
HCT: 31.2 % — ABNORMAL LOW (ref 39.0–52.0)
Hemoglobin: 9.6 g/dL — ABNORMAL LOW (ref 13.0–17.0)
Lymphocytes Relative: 11 % — ABNORMAL LOW (ref 12–46)
Lymphs Abs: 0.9 K/uL (ref 0.7–4.0)
MCH: 28.6 pg (ref 26.0–34.0)
MCHC: 30.8 g/dL (ref 30.0–36.0)
MCV: 92.9 fL (ref 78.0–100.0)
Monocytes Absolute: 0.7 K/uL (ref 0.1–1.0)
Monocytes Relative: 9 % (ref 3–12)
Neutro Abs: 6.1 K/uL (ref 1.7–7.7)
Neutrophils Relative %: 80 % — ABNORMAL HIGH (ref 43–77)
Platelets: 101 K/uL — ABNORMAL LOW (ref 150–400)
RBC: 3.36 MIL/uL — ABNORMAL LOW (ref 4.22–5.81)
RDW: 20.2 % — ABNORMAL HIGH (ref 11.5–15.5)
WBC: 7.7 K/uL (ref 4.0–10.5)

## 2013-09-25 LAB — CULTURE, BLOOD (ROUTINE X 2)
CULTURE: NO GROWTH
Culture: NO GROWTH

## 2013-09-25 LAB — RENAL FUNCTION PANEL
ALBUMIN: 2.2 g/dL — AB (ref 3.5–5.2)
BUN: 88 mg/dL — ABNORMAL HIGH (ref 6–23)
CHLORIDE: 96 meq/L (ref 96–112)
CO2: 37 meq/L — AB (ref 19–32)
CREATININE: 2.63 mg/dL — AB (ref 0.50–1.35)
Calcium: 8.1 mg/dL — ABNORMAL LOW (ref 8.4–10.5)
GFR, EST AFRICAN AMERICAN: 25 mL/min — AB (ref >90)
GFR, EST NON AFRICAN AMERICAN: 22 mL/min — AB (ref >90)
Glucose, Bld: 238 mg/dL — ABNORMAL HIGH (ref 70–99)
Phosphorus: 6.2 mg/dL — ABNORMAL HIGH (ref 2.3–4.6)
Potassium: 5.3 mEq/L (ref 3.7–5.3)
SODIUM: 143 meq/L (ref 137–147)

## 2013-09-25 LAB — BASIC METABOLIC PANEL
BUN: 90 mg/dL — ABNORMAL HIGH (ref 6–23)
CO2: 37 meq/L — AB (ref 19–32)
Calcium: 8.1 mg/dL — ABNORMAL LOW (ref 8.4–10.5)
Chloride: 96 mEq/L (ref 96–112)
Creatinine, Ser: 2.43 mg/dL — ABNORMAL HIGH (ref 0.50–1.35)
GFR calc non Af Amer: 24 mL/min — ABNORMAL LOW (ref >90)
GFR, EST AFRICAN AMERICAN: 28 mL/min — AB (ref >90)
Glucose, Bld: 251 mg/dL — ABNORMAL HIGH (ref 70–99)
POTASSIUM: 5.3 meq/L (ref 3.7–5.3)
SODIUM: 142 meq/L (ref 137–147)

## 2013-09-25 MED ORDER — SODIUM CHLORIDE 0.9 % IV BOLUS (SEPSIS)
250.0000 mL | Freq: Once | INTRAVENOUS | Status: AC
  Administered 2013-09-25: 250 mL via INTRAVENOUS

## 2013-09-25 MED ORDER — OXYCODONE-ACETAMINOPHEN 5-325 MG PO TABS
1.0000 | ORAL_TABLET | ORAL | Status: DC | PRN
  Administered 2013-09-25: 1 via ORAL
  Filled 2013-09-25 (×2): qty 1

## 2013-09-25 NOTE — Consult Note (Signed)
Consult: difficult foley, anuria Requested by: Dr. Mahala Menghini   History of Present Illness: Critically ill 80 yo with progressive renal failure, anasarca, anuria. A foley was placed earlier which did not drain any urine. A coude was attempted this evening and it is not draining and pt has developed bleeding per urethra through the foley and around it. The catheter would not irrigate. Pt had been incontinent and his exact UOP unknown. An I&O cath in ER x 2 also unsuccessful last week. His   Per family, wife and son - pt has h/o BPH, but they deny any prostate surgery, GU surgery, tx for prostate cancer (surgery or XRT).     Past Medical History  Diagnosis Date  . Hypertension   . Diabetes mellitus   . Symptomatic bradycardia     s/p pacemaker  . CHF (congestive heart failure)   . Dysrhythmia   . Shortness of breath   . Pacemaker   . Anginal pain     PER PATIENT  . Pulmonary hypertension 04/15/2013  . HTN (hypertension) 08/06/2013  . Protein-calorie malnutrition, severe 08/06/2013  . Ex-cigarette smoker 08/08/2013   Past Surgical History  Procedure Laterality Date  . Pacemaker insertion  06/16/2010    Biotronik devise at [place]  . Neck surgery    . Joint replacement      bilateral hip  . Cholecystectomy      Home Medications:  Prescriptions prior to admission  Medication Sig Dispense Refill  . aspirin EC 81 MG EC tablet Take 1 tablet (81 mg total) by mouth daily.      . carvedilol (COREG) 3.125 MG tablet Take 1 tablet (3.125 mg total) by mouth 2 (two) times daily with a meal.  60 tablet  5  . furosemide (LASIX) 40 MG tablet Take 1 tablet (40 mg total) by mouth 2 (two) times daily.  60 tablet  4  . glipiZIDE (GLUCOTROL) 5 MG tablet Take 0.5 tablets (2.5 mg total) by mouth daily before breakfast.  30 tablet  2  . lisinopril (PRINIVIL,ZESTRIL) 20 MG tablet Take 20 mg by mouth daily.       Marland Kitchen oxyCODONE (OXY IR/ROXICODONE) 5 MG immediate release tablet Take 1 tablet (5 mg total) by  mouth every 4 (four) hours as needed for moderate pain.  30 tablet  0   Allergies: No Known Allergies  Family History  Problem Relation Age of Onset  . Heart failure Mother    Social History:  reports that he quit smoking about 41 years ago. His smoking use included Cigarettes. He smoked 0.00 packs per day. He has quit using smokeless tobacco. His smokeless tobacco use included Snuff and Chew. He reports that he does not drink alcohol or use illicit drugs.  ROS: A complete review of systems was performed.  All systems are negative except for pertinent findings as noted. ROS   Physical Exam:  Vital signs in last 24 hours: Temp:  [97.5 F (36.4 C)-97.9 F (36.6 C)] 97.9 F (36.6 C) (02/21 1312) Pulse Rate:  [61-70] 61 (02/21 1312) Resp:  [16-18] 16 (02/21 0650) BP: (102-108)/(48-56) 102/54 mmHg (02/21 1312) SpO2:  [95 %-98 %] 95 % (02/21 1312) General:  NAd but obtunded  HEENT: Normocephalic, atraumatic Neck: No JVD or lymphadenopathy Cardiovascular: Regular rate and rhythm Lungs: Regular rate and effort Abdomen: Soft, nontender, nondistended, no abdominal masses, bladder not distended Back: No CVA tenderness Extremities: No edema Neurologic: Grossly intact GU - active bleeding around a coude catheter and in bag,  most of the coude length is hanging out of the penis and the catheter is not mobile. I had the foley removed and he had BRB per urethra.   PVR bladder scan = 30 ml   Laboratory Data:  Results for orders placed during the hospital encounter of 09/21/2013 (from the past 24 hour(s))  GLUCOSE, CAPILLARY     Status: Abnormal   Collection Time    09/24/13  9:06 PM      Result Value Ref Range   Glucose-Capillary 395 (*) 70 - 99 mg/dL   Comment 1 Notify RN    GLUCOSE, CAPILLARY     Status: Abnormal   Collection Time    09/25/13  2:24 AM      Result Value Ref Range   Glucose-Capillary 286 (*) 70 - 99 mg/dL   Comment 1 Documented in Chart     Comment 2 Notify RN     BASIC METABOLIC PANEL     Status: Abnormal   Collection Time    09/25/13  4:00 AM      Result Value Ref Range   Sodium 143  137 - 147 mEq/L   Potassium 5.4 (*) 3.7 - 5.3 mEq/L   Chloride 96  96 - 112 mEq/L   CO2 39 (*) 19 - 32 mEq/L   Glucose, Bld 290 (*) 70 - 99 mg/dL   BUN 88 (*) 6 - 23 mg/dL   Creatinine, Ser 9.602.39 (*) 0.50 - 1.35 mg/dL   Calcium 8.2 (*) 8.4 - 10.5 mg/dL   GFR calc non Af Amer 24 (*) >90 mL/min   GFR calc Af Amer 28 (*) >90 mL/min  CBC WITH DIFFERENTIAL     Status: Abnormal   Collection Time    09/25/13  4:00 AM      Result Value Ref Range   WBC 7.7  4.0 - 10.5 K/uL   RBC 3.36 (*) 4.22 - 5.81 MIL/uL   Hemoglobin 9.6 (*) 13.0 - 17.0 g/dL   HCT 45.431.2 (*) 09.839.0 - 11.952.0 %   MCV 92.9  78.0 - 100.0 fL   MCH 28.6  26.0 - 34.0 pg   MCHC 30.8  30.0 - 36.0 g/dL   RDW 14.720.2 (*) 82.911.5 - 56.215.5 %   Platelets 101 (*) 150 - 400 K/uL   Neutrophils Relative % 80 (*) 43 - 77 %   Neutro Abs 6.1  1.7 - 7.7 K/uL   Lymphocytes Relative 11 (*) 12 - 46 %   Lymphs Abs 0.9  0.7 - 4.0 K/uL   Monocytes Relative 9  3 - 12 %   Monocytes Absolute 0.7  0.1 - 1.0 K/uL   Eosinophils Relative 0  0 - 5 %   Eosinophils Absolute 0.0  0.0 - 0.7 K/uL   Basophils Relative 0  0 - 1 %   Basophils Absolute 0.0  0.0 - 0.1 K/uL  GLUCOSE, CAPILLARY     Status: Abnormal   Collection Time    09/25/13  7:44 AM      Result Value Ref Range   Glucose-Capillary 202 (*) 70 - 99 mg/dL  GLUCOSE, CAPILLARY     Status: Abnormal   Collection Time    09/25/13 11:20 AM      Result Value Ref Range   Glucose-Capillary 240 (*) 70 - 99 mg/dL  BASIC METABOLIC PANEL     Status: Abnormal   Collection Time    09/25/13 12:58 PM      Result Value Ref Range  Sodium 142  137 - 147 mEq/L   Potassium 5.3  3.7 - 5.3 mEq/L   Chloride 96  96 - 112 mEq/L   CO2 37 (*) 19 - 32 mEq/L   Glucose, Bld 251 (*) 70 - 99 mg/dL   BUN 90 (*) 6 - 23 mg/dL   Creatinine, Ser 1.61 (*) 0.50 - 1.35 mg/dL   Calcium 8.1 (*) 8.4 - 10.5 mg/dL    GFR calc non Af Amer 24 (*) >90 mL/min   GFR calc Af Amer 28 (*) >90 mL/min  GLUCOSE, CAPILLARY     Status: Abnormal   Collection Time    09/25/13  4:47 PM      Result Value Ref Range   Glucose-Capillary 201 (*) 70 - 99 mg/dL   Recent Results (from the past 240 hour(s))  CULTURE, BLOOD (ROUTINE X 2)     Status: None   Collection Time    09/19/13  5:05 AM      Result Value Ref Range Status   Specimen Description BLOOD RIGHT WRIST   Final   Special Requests BOTTLES DRAWN AEROBIC AND ANAEROBIC 5CC   Final   Culture  Setup Time     Final   Value: 09/19/2013 13:28     Performed at [facility]   Culture     Final   Value: NO GROWTH 5 DAYS     Performed at [facility]   Report Status 09/25/2013 FINAL   Final  CULTURE, BLOOD (ROUTINE X 2)     Status: None   Collection Time    09/19/13  5:18 AM      Result Value Ref Range Status   Specimen Description BLOOD BLOOD RIGHT FOREARM   Final   Special Requests BOTTLES DRAWN AEROBIC ONLY 4CC   Final   Culture  Setup Time     Final   Value: 09/19/2013 13:28     Performed at [facility]   Culture     Final   Value: NO GROWTH 5 DAYS     Performed at [facility]   Report Status 09/25/2013 FINAL   Final  MRSA PCR SCREENING     Status: None   Collection Time    09/19/13  6:37 AM      Result Value Ref Range Status   MRSA by PCR NEGATIVE  NEGATIVE Final   Comment:            The GeneXpert MRSA Assay (FDA     approved for NASAL specimens     only), is one component of a     comprehensive MRSA colonization     surveillance program. It is not     intended to diagnose MRSA     infection nor to guide or     monitor treatment for     MRSA infections.   Creatinine:  Recent Labs  09/20/13 0530 09/21/13 0430 09/22/13 0405 09/23/13 0520 09/24/13 0540 09/25/13 0400 09/25/13 1258  CREATININE 1.27 1.32 1.31 1.51* 1.99* 2.39* 2.43*    Impression/Assessment/plan: -gross hematuria -ARF -anuria,  anascarca -BPH  I had a long discussion with wife and son and discussed pt poor prognosis. Discussed the nature, r/b/a to foley catheter placement, possible cystoscopy. Discussed he is likely not making any urine, so the catheter wont help him "make water", it will only drain what his kidneys produce. However, a foley might help tamponade some of the bleeding and might help guide the medical Dr's on  how much IVF, lasix etc to give based on a more accurate assessment of his UOP. We then futher discussed the alternative -- remove the catheter that is in (not working), follow serial bladder scans, if pt goes in retention then we can place a foley. They were then insistent I try to place a foley and perform cystoscopy. The consented to catheter placement, cystoscopy.   Antony Haste 09/25/2013, 6:04 PM

## 2013-09-25 NOTE — Consult Note (Addendum)
Reason for Consult:AKI Referring Physician: Mahala Menghini, MD  Frederick [name] is an 80 y.o. male.  HPI: Pt is a 80yo M with multiple medical problems including DM, HTN, COPD, h/o pacemaker placement due to bradycardia, and severe ICMP with an EF of 15-20% who has been admitted 3 times over the last 6 weeks.  Initially admitted early January with decompensated CHF, then readmitted soon afterward with HCAP, and most recently on 09/12/2013 for FTT and the development of sacral decubiti and inability to care for himself.  During the hospitalization, the patient's condition has continued to decline.  He has a history of ongoing GIB requiring transfusion, however was deemed not a candidate for colonoscopy and/or surgery.  We were consulted because he has developed oliguric ARF.  The trend in Scr is seen below.    Of note, he had a CT angio on 09/19/13 and received 43ml of omipaque contrast IV.  He also had relative hypotension starting on 09/21/13.  He had been on lisinopril PTA but was not given an ACE/ARB during this hospitalization.  Trend in Creatinine: Creatinine, Ser  Date/Time Value Ref Range Status  09/25/2013  8:10 PM 2.63* 0.50 - 1.35 mg/dL Final  3/64/6803 21:22 PM 2.43* 0.50 - 1.35 mg/dL Final  4/82/5003  7:04 AM 2.39* 0.50 - 1.35 mg/dL Final  8/88/9169  4:50 AM 1.99* 0.50 - 1.35 mg/dL Final  3/88/8280  0:34 AM 1.51* 0.50 - 1.35 mg/dL Final  04/21/9149  5:69 AM 1.31  0.50 - 1.35 mg/dL Final  7/94/8016  5:53 AM 1.32  0.50 - 1.35 mg/dL Final  7/48/2707  8:67 AM 1.27  0.50 - 1.35 mg/dL Final  5/44/9201  0:07 AM 1.41* 0.50 - 1.35 mg/dL Final  08/25/9756  8:32 AM 0.90  0.50 - 1.35 mg/dL Final  5/49/8264  1:58 AM 0.96  0.50 - 1.35 mg/dL Final  10/12/4074  8:08 AM 1.02  0.50 - 1.35 mg/dL Final  03/15/314  9:45 AM 1.20  0.50 - 1.35 mg/dL Final  03/10/9291  4:46 AM 1.19  0.50 - 1.35 mg/dL Final  09/13/6379  7:71 PM 1.24  0.50 - 1.35 mg/dL Final  08/10/5788  3:83 AM 1.20  0.50 - 1.35 mg/dL Final  10/06/8327  1:91 AM  1.35  0.50 - 1.35 mg/dL Final  01/08/599  4:59 AM 1.72* 0.50 - 1.35 mg/dL Final  04/11/7413  2:39 AM 1.88* 0.50 - 1.35 mg/dL Final  12/05/2021  3:43 AM 2.05* 0.50 - 1.35 mg/dL Final  5/68/6168  3:72 AM 0.82  0.50 - 1.35 mg/dL Final  04/06/1114  5:20 AM 0.85  0.50 - 1.35 mg/dL Final  03/07/2335 12:24 PM 0.81  0.50 - 1.35 mg/dL Final  4/97/5300  5:11 AM 0.94  0.50 - 1.35 mg/dL Final  0/21/1173  5:67 AM 0.94  0.50 - 1.35 mg/dL Final  0/14/1030  1:31 AM 0.84  0.50 - 1.35 mg/dL Final  11/05/8885  5:79 PM 0.98  0.50 - 1.35 mg/dL Final  02/03/8205  0:15 PM 1.00  0.50 - 1.35 mg/dL Final  01/03/5378  4:32 AM 0.88  0.50 - 1.35 mg/dL Final  76/14/7092  9:57 PM 0.90  0.50 - 1.35 mg/dL Final    PMH:   Past Medical History  Diagnosis Date  . Hypertension   . Diabetes mellitus   . Symptomatic bradycardia     s/p pacemaker  . CHF (congestive heart failure)   . Dysrhythmia   . Shortness of breath   . Pacemaker   . Anginal pain  PER PATIENT  . Pulmonary hypertension 04/15/2013  . HTN (hypertension) 08/06/2013  . Protein-calorie malnutrition, severe 08/06/2013  . Ex-cigarette smoker 08/08/2013    PSH:   Past Surgical History  Procedure Laterality Date  . Pacemaker insertion  06/16/2010    Biotronik devise at [place]  . Neck surgery    . Joint replacement      bilateral hip  . Cholecystectomy      Allergies: No Known Allergies  Medications:   Prior to Admission medications   Medication Sig Start Date End Date Taking? Authorizing Provider  aspirin EC 81 MG EC tablet Take 1 tablet (81 mg total) by mouth daily. 04/21/13  Yes Brittainy Sharol Harness, PA-C  carvedilol (COREG) 3.125 MG tablet Take 1 tablet (3.125 mg total) by mouth 2 (two) times daily with a meal. 04/21/13  Yes Brittainy Simmons, PA-C  furosemide (LASIX) 40 MG tablet Take 1 tablet (40 mg total) by mouth 2 (two) times daily. 08/10/13  Yes Esperanza Sheets, MD  glipiZIDE (GLUCOTROL) 5 MG tablet Take 0.5 tablets (2.5 mg total) by mouth daily before  breakfast. 08/10/13  Yes Esperanza Sheets, MD  lisinopril (PRINIVIL,ZESTRIL) 20 MG tablet Take 20 mg by mouth daily.    Yes Historical Provider, MD  oxyCODONE (OXY IR/ROXICODONE) 5 MG immediate release tablet Take 1 tablet (5 mg total) by mouth every 4 (four) hours as needed for moderate pain. 08/17/13  Yes Dorothea Ogle, MD    Inpatient medications: . feeding supplement (ENSURE COMPLETE)  237 mL Oral BID BM  . fluconazole  100 mg Oral Daily  . levofloxacin  750 mg Oral Q48H  . pantoprazole  40 mg Oral BID AC  . saccharomyces boulardii  250 mg Oral BID  . sodium chloride  10-40 mL Intracatheter Q12H    Discontinued Meds:   Medications Discontinued During This Encounter  Medication Reason  . levofloxacin (LEVAQUIN) 500 MG tablet Completed Course  . ceFEPIme (MAXIPIME) 1 g in dextrose 5 % 50 mL IVPB Dose change  . fluconazole (DIFLUCAN) IVPB 100 mg   . 0.9 %  sodium chloride infusion   . 0.9 %  sodium chloride infusion   . vancomycin (VANCOCIN) IVPB 1000 mg/200 mL premix   . ceFEPIme (MAXIPIME) 1 g in dextrose 5 % 50 mL IVPB   . insulin detemir (LEVEMIR) injection 10 Units   . insulin aspart (novoLOG) injection 0-5 Units   . furosemide (LASIX) tablet 40 mg   . pantoprazole (PROTONIX) injection 40 mg   . insulin aspart (novoLOG) injection 0-9 Units   . ceFEPIme (MAXIPIME) 1 g in dextrose 5 % 50 mL IVPB   . ceFEPIme (MAXIPIME) 1 g in dextrose 5 % 50 mL IVPB   . vancomycin (VANCOCIN) IVPB 750 mg/150 ml premix   . spironolactone (ALDACTONE) tablet 12.5 mg   . furosemide (LASIX) tablet 80 mg   . insulin aspart (novoLOG) injection 0-9 Units   . insulin aspart (novoLOG) injection 0-5 Units   . carvedilol (COREG) tablet 3.125 mg   . potassium chloride (K-DUR,KLOR-CON) CR tablet 10 mEq     Social History:  reports that he quit smoking about 41 years ago. His smoking use included Cigarettes. He smoked 0.00 packs per day. He has quit using smokeless tobacco. His smokeless tobacco use included  Snuff and Chew. He reports that he does not drink alcohol or use illicit drugs.  Family History:   Family History  Problem Relation Age of Onset  . Heart failure Mother  Review of systems not obtained due to patient factors. Weight change:   Intake/Output Summary (Last 24 hours) at 09/25/13 2059 Last data filed at 09/25/13 1900  Gross per 24 hour  Intake    130 ml  Output     95 ml  Net     35 ml   BP 102/54  Pulse 61  Temp(Src) 97.9 F (36.6 C) (Axillary)  Resp 16  Ht 5\' 7"  (1.702 m)  Wt 58.968 kg (130 lb)  BMI 20.36 kg/m2  SpO2 95% Filed Vitals:   09/24/13 1241 09/24/13 2135 09/25/13 0650 09/25/13 1312  BP: 101/61 108/56 103/48 102/54  Pulse: 76 70 62 61  Temp: 97.4 F (36.3 C) 97.9 F (36.6 C) 97.5 F (36.4 C) 97.9 F (36.6 C)  TempSrc: Axillary Oral Oral Axillary  Resp: 18 18 16    Height:      Weight:      SpO2: 100% 98% 97% 95%     General appearance: cachectic and obtunded Head: Normocephalic, without obvious abnormality, atraumatic, bitemporal wasting Resp: diminished breath sounds bilaterally and rhonchi bilaterally Cardio: faint HS, no rub GI: +BS, mildly distended, no guarding/rebound Extremities: edema 2+ edema of upper arms and 1+ pretibial lower ext  Labs: Basic Metabolic Panel:  Recent Labs Lab 09/19/13 0100  09/21/13 0430 09/21/13 0825 09/22/13 0405 09/23/13 0520 09/24/13 0540 09/25/13 0400 09/25/13 1258 09/25/13 2010  NA 146  < > 146  --  141 142 140 143 142 143  K 3.4*  < > 3.5*  --  3.9 4.6 5.2 5.4* 5.3 5.3  CL 93*  < > 99  --  95* 92* 92* 96 96 96  CO2 >45*  < > >45*  --  44* 38* 36* 39* 37* 37*  GLUCOSE 219*  < > 32* 51* 90 76 206* 290* 251* 238*  BUN 57*  < > 50*  --  52* 58* 78* 88* 90* 88*  CREATININE 1.41*  < > 1.32  --  1.31 1.51* 1.99* 2.39* 2.43* 2.63*  ALBUMIN 2.6*  --   --   --   --  2.3* 2.3*  --   --  2.2*  CALCIUM 8.6  < > 7.9*  --  8.2* 8.9 8.8 8.2* 8.1* 8.1*  PHOS  --   --   --   --   --   --  5.7*  --   --   6.2*  < > = values in this interval not displayed. Liver Function Tests:  Recent Labs Lab 09/19/13 0100 09/23/13 0520 09/24/13 0540 09/25/13 2010  AST 29 22  --   --   ALT 20 16  --   --   ALKPHOS 91 92  --   --   BILITOT 1.1 1.2  --   --   PROT 6.0 5.8*  --   --   ALBUMIN 2.6* 2.3* 2.3* 2.2*   No results found for this basename: LIPASE, AMYLASE,  in the last 168 hours  Recent Labs Lab 09/19/13 1804  AMMONIA 25   CBC:  Recent Labs Lab 09/19/13 0100  09/23/13 0520 09/24/13 0540 09/25/13 0400 09/25/13 2007  WBC 12.8*  < > 11.8* 10.5 7.7 11.8*  NEUTROABS 11.3*  --   --   --  6.1  --   HGB 7.9*  < > 10.6* 10.6* 9.6* 9.5*  HCT 26.5*  < > 34.4* 33.8* 31.2* 30.2*  MCV 88.9  < > 92.0 91.4 92.9 90.4  PLT  138*  < > 118* 126* 101* 84*  < > = values in this interval not displayed. PT/INR: @LABRCNTIP (inr:5) Cardiac Enzymes: ) Recent Labs Lab 09/19/13 0100  TROPONINI <0.30   CBG:  Recent Labs Lab 09/24/13 2106 09/25/13 0224 09/25/13 0744 09/25/13 1120 09/25/13 1647  GLUCAP 395* 286* 202* 240* 201*    Iron Studies:  Recent Labs Lab 09/20/13 0800  IRON 12*  TIBC 169*  TRANSFERRIN 137*  FERRITIN 241    Xrays/Other Studies: No results found.   Assessment/Plan: 1.  AKI- oliguric: possibly Contrast-induced nephropathy in setting of volume depletion (FeNa <1%) 1. Check bladder scan to r/o BOO (foley removed) 2. Pt is not a candidate for dialysis given his poor prognosis and functional status (as well as his severe ICMP) 3. Recommend palliative care and hospice services 4. Might try lasix after bladder scan to help with hypoxia and hyperkalemia but would not offer RRT 2. Hyperkalemia- due to #1 1. Consider dose of Lasix 80mg  IV x 1 (although has pacemaker so bradycardia is not an issue) 3. Anemia from chronic GI losses- 4. Contraction alkalosis- due to hypoalbuminemia and GIB 5. Iron deficiency 6. Severe protein malnutrition 7. ICMP/CHF- total body  volume overload and intravascular volume depletion due to hypoalbuminemia and GIB 8. Sacral decubiti- wound care 9. COPD- on O2 10. HCAP- on levaquin 11. FTT- agree with Dr. Mahala MenghiniSamtani, palliative care/hospice is indicated for this unfortunate gentleman. 12. BOO- traumatic foley and urology unable to place coude catheter.  Hold lasix until US/bladder scan performed   Devaun Hernandez A 09/25/2013, 8:59 PM

## 2013-09-25 NOTE — Progress Notes (Signed)
Pt has order to place Coude cath. Pt found with in bed with previous cath placed no urine noted.  Moderate amount of blood noted with clots coming from the penis. MD on unit came to room to assess. Asked to proceed with the placement of the cath. Immediate return of  Bloody with clot. Advanced foley without difficulty to the hub. MD requested that cath be irrigated RN caring for pt to complete this task.

## 2013-09-25 NOTE — Progress Notes (Signed)
[facility] PROGRESS NOTE  Frederick [name] ZOX:[id number] DOB: May 27, 1932 DOA: 09/30/2013 PCP: Arvella Nigh, MD  Brief Summary  80 y/o ? PMH htn, DM ty 2, PPM insertion bradycardia, severe NICM  EF of 15-20%, who has had 4 hospitalizations in the last month + rapid decline.  He was discharged from the [place] [facility] home at the request of his debilitated wife who also has a caregiver, however, he was severely deconditioned and probably needed SNF.  He was unable to care for himself and he had previously been her caretaker.  He complained of increasing pain on his sacrum from his large stage III sacral decub so he returned to the hospital.  He is being tx for HCAP, assessing fluid status.  He required transfusion of blood due to GI losses, but he is not a good candidate for colo or surgery.  Per wife, he was transfused at the [facility] for same reason.  Given his heart failure, weight loss, severe malnutrition, rapid decline, recurrent anemia, he would be a good candidate for palliative care, however, the wife has repeatedly declined.  He is DNR.  He is recommended for SNF, however, the wife again wants him to be discharged to an apartment in [place] and she would like to arrange for 24 hour aid assistance for both of them.  Social work and case management are assisting.     multiple discussions with wife regarding exceedingly poor prognosis and high likelihood to be readmitted for compensated heart failure-patient's wife still wishes to take the patient home as she has had bad feelings and notions about nursing home care   Assessment/Plan  Acute hypoxic respiratory failure due to HCAP (healthcare-associated pneumonia) vs. atelectasis.  CTA negative for PE.   -  BCx NGTD -  Flu neg -  S. pneumo neg -  Legionella neg - Narrow IV vancomycin and IV cefepime 2/18 to Levaquin PO and reassess WBC in am and fever curve -Antibiotics stop date 09/27/13=10 days for Hcap  Lethargy and confusion ? to infection or heart  failure-sporadically and resolves spontaneously. -  ABG with mild hypercapnea, however, pH elevated.  Suspect chronic CO2 retention -  Ammonia 25  Chronic systolic heart failure with severe NICM EF 15-20% s/p pacemaker for bradycardia Recent admission for heart failure. Discharge weight of 126-lbs and currently 130lbs.    Has pronounced JVP and effusions on CXR, however, no overt pulmonary edema.  proBNP > 70K.   - Held lasix to 80mg  BID - Held aldactone 12.5 mg given volume depletion - Net positive 2.5-difficult situation with cardiorenal syndrome -CXR am  AKI, -  FENa:  Prerenal  -suspect this is new baseline 2/2 to need for diuresis -Patient I suspect is becoming uremic and I will hold off on further diuresis today -IV saline started for fluid repletion on 2/21 but will d/c as developing anasarca  Blood leaking from urethra likely due to I/O cath trauma, resolving -  Spoke with urology Dr. Jolyne Loa regarding management who recommended avoiding cath unless he has evidence of obstruction.  Placing coude catheter and leave in for approximately 2 days.   -  Nurse to monitor uop and obtain PVR if uop decreases.  If increasing residuals, notify MD for orders  Hypertension, at risk for hypotension -  Restart coreg 3.125 BID -  Hold ACEI due to AKI  T2DM, hypoglycemic A1c 9.2 last month -SSI sensitive re-started 2/21 given mild hyprglycemia -  D/c levemir -Liberalize diet patient is taking in less than  50% of all meals   Bilateral hip and knee pain, worse than baseline -  X-rays of hip and knee:  No fracture  Left arm basilic vein thrombus -  Wife declines a/c b/c she feels it caused the large bruise of his right side  Deconditioning due to heart failure, recurrent infections, severe protein calorie malnutrition -  PT/OT assessments consistently recommned SNF  Multiple pressure ulcers, feet, sacrum, back -  Appreciate wound care recs -  Air mattress  -  Booties -see pictures  from prior-stable  Severe protein calorie malnutrition -  Supplements, liberalized diet  Leukocytosis,improved since admission -  Trend WBC -  Continue abx, stop date 2/23  Normocytic anemia, recently received blood transfusion [facility], occult stool positive.  Patient is not a good candidate for surgery or colonoscopy  -  Iron studies consistent with anemia of chronic disease but given occult positive stool, may be mixed picture -  Feraheme x 1  -  b12 1004 folate wnl, TSH 0.834 -  Occult stool positive -  S/p 2 units PRBC 09/20/13  Thrombocytopenia, acute phase reactant  Hypokalemia due to malnutrition, replete with oral potassium  Hypothermia due to severe heart failure  -  Warming blanket if needed  Thrush:  Continue oral fluconazole once daily, stop 2/26  Diet:  Dysphagia 1 Access:  PICC IVF:  yes Proph:  lovenox  Code Status: DNR Family Communication: spoke with patient and wife-long discussion with wife today 2/20 who seems to understand that patient is at the end of his natural life expectancy with severe multiorgan disease. She still adamantly is insistent on taking him home and understand the risks of this. When asked directly regarding CODE STATUS she confirms DO NOT RESUSCITATE  30 min discussion with son, wife again 2/21 Seem to understand EOL issues are present Multiple questions answered Consultants:  None  Procedures:  CXR  CT anio  XR knee right  XR bilateral hips  Antibiotics:  Vancomycin 2/15-2/18  Zosyn x 1 2/15  Cefepime 2/15 >> 2/18  Levaquin 2/18>>>2/21  Fluconazole 2/16>>>  HPI/Subjective:  more alert Doing poorly Looks about the same Less talkative today Catheter placed-unfortunatelys now bleeding.   Poor UOP   Objective: Filed Vitals:   09/24/13 1241 09/24/13 2135 09/25/13 0650 09/25/13 1312  BP: 101/61 108/56 103/48 102/54  Pulse: 76 70 62 61  Temp: 97.4 F (36.3 C) 97.9 F (36.6 C) 97.5 F (36.4 C)  97.9 F (36.6 C)  TempSrc: Axillary Oral Oral Axillary  Resp: 18 18 16    Height:      Weight:      SpO2: 100% 98% 97% 95%    Intake/Output Summary (Last 24 hours) at 09/25/13 1609 Last data filed at 09/25/13 1300  Gross per 24 hour  Intake    465 ml  Output     25 ml  Net    440 ml   Filed Weights   09/21/13 1246 09/22/13 0627 09/24/13 0508  Weight: 59.33 kg (130 lb 12.8 oz) 59.104 kg (130 lb 4.8 oz) 58.968 kg (130 lb)    Exam:   General:  Cachectic BM, No acute distress  HEENT:  NCAT, MMM, JVP to preauricular area   Cardiovascular:  Distant IRRR, nl S1, S2, + gallop, 1+ pulses, cool extremities  Respiratory:   Diminished bilateral BS, no wheezes or rhonchi, no increased WOB  Abdomen:   NABS, soft, NT/ND  MSK:   Decreased tone and bulk, no LEE,  bilateral arms with 1+ nonpitting edema   Neuro:  Grossly intact  Skin:  See below             Data Reviewed: Basic Metabolic Panel:  Recent Labs Lab 09/22/13 0405 09/23/13 0520 09/24/13 0540 09/25/13 0400 09/25/13 1258  NA 141 142 140 143 142  K 3.9 4.6 5.2 5.4* 5.3  CL 95* 92* 92* 96 96  CO2 44* 38* 36* 39* 37*  GLUCOSE 90 76 206* 290* 251*  BUN 52* 58* 78* 88* 90*  CREATININE 1.31 1.51* 1.99* 2.39* 2.43*  CALCIUM 8.2* 8.9 8.8 8.2* 8.1*  PHOS  --   --  5.7*  --   --    Liver Function Tests:  Recent Labs Lab 09/19/13 0100 09/23/13 0520 09/24/13 0540  AST 29 22  --   ALT 20 16  --   ALKPHOS 91 92  --   BILITOT 1.1 1.2  --   PROT 6.0 5.8*  --   ALBUMIN 2.6* 2.3* 2.3*   No results found for this basename: LIPASE, AMYLASE,  in the last 168 hours  Recent Labs Lab 09/19/13 1804  AMMONIA 25   CBC:  Recent Labs Lab 09/19/13 0100  09/21/13 0430 09/22/13 0405 09/23/13 0520 09/24/13 0540 09/25/13 0400  WBC 12.8*  < > 12.8* 9.9 11.8* 10.5 7.7  NEUTROABS 11.3*  --   --   --   --   --  6.1  HGB 7.9*  < > 9.5* 10.0* 10.6* 10.6* 9.6*  HCT 26.5*  < > 29.6* 32.1* 34.4* 33.8* 31.2*  MCV  88.9  < > 88.1 90.9 92.0 91.4 92.9  PLT 138*  < > 98* 94* 118* 126* 101*  < > = values in this interval not displayed. Cardiac Enzymes:  Recent Labs Lab 09/19/13 0100  TROPONINI <0.30   BNP (last 3 results)  Recent Labs  08/06/13 1121 08/12/13 1649 09/19/13 0100  PROBNP >70000.0* 61891.0* >70000.0*   CBG:  Recent Labs Lab 09/24/13 1652 09/24/13 2106 09/25/13 0224 09/25/13 0744 09/25/13 1120  GLUCAP 319* 395* 286* 202* 240*    Recent Results (from the past 240 hour(s))  CULTURE, BLOOD (ROUTINE X 2)     Status: None   Collection Time    09/19/13  5:05 AM      Result Value Ref Range Status   Specimen Description BLOOD RIGHT WRIST   Final   Special Requests BOTTLES DRAWN AEROBIC AND ANAEROBIC 5CC   Final   Culture  Setup Time     Final   Value: 09/19/2013 13:28     Performed at [facility]   Culture     Final   Value: NO GROWTH 5 DAYS     Performed at [facility]   Report Status 09/25/2013 FINAL   Final  CULTURE, BLOOD (ROUTINE X 2)     Status: None   Collection Time    09/19/13  5:18 AM      Result Value Ref Range Status   Specimen Description BLOOD BLOOD RIGHT FOREARM   Final   Special Requests BOTTLES DRAWN AEROBIC ONLY 4CC   Final   Culture  Setup Time     Final   Value: 09/19/2013 13:28     Performed at [facility]   Culture     Final   Value: NO GROWTH 5 DAYS     Performed at [facility]   Report Status 09/25/2013 FINAL   Final  MRSA  PCR SCREENING     Status: None   Collection Time    09/19/13  6:37 AM      Result Value Ref Range Status   MRSA by PCR NEGATIVE  NEGATIVE Final   Comment:            The GeneXpert MRSA Assay (FDA     approved for NASAL specimens     only), is one component of a     comprehensive MRSA colonization     surveillance program. It is not     intended to diagnose MRSA     infection nor to guide or     monitor treatment for     MRSA infections.     Studies: No results  found.  Scheduled Meds: . feeding supplement (ENSURE COMPLETE)  237 mL Oral BID BM  . fluconazole  100 mg Oral Daily  . levofloxacin  750 mg Oral Q48H  . pantoprazole  40 mg Oral BID AC  . saccharomyces boulardii  250 mg Oral BID  . sodium chloride  10-40 mL Intracatheter Q12H   Continuous Infusions: . 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

## 2013-09-25 NOTE — Progress Notes (Signed)
Patient ID: Frederick [name], male   DOB: [birth year], 79 y.o.   MRN: [id number]   Procedure note-  Preop diagnosis: Acute renal failure Gross hematuria BPH  Postop diagnosis: Urethral stricture, urethral false passage Urinary retention   Procedure: Cystoscopy, placement of Foley over wire  Surgeon: Mena Goes  Description of procedure: After consent was obtained the patient was prepped and draped in the usual sterile fashion. He was supine in the bed. A flexible cystoscope was passed per urethra and in the bulb urethral false passage was encountered ventrally. Dorsally the true lumen was visible and the membranous urethra could be seen a few centimeters proximal. There appeared to be a stricture of the urethra here, but large enough to accommodate the flexible cystoscope which was angled up and into the true lumen, through the prostatic urethra which did not appear to be particularly obstructed with BPH and into the bladder. The urine was cloudy so bladder mucosal visualization was poor. However what could be noted was that there was certainly more than 30 cc and the bladder.  A sensor wire was advanced through the scope and coiled in the bladder. The cystoscope was then backed out and over the wire I attempted to place an 18 [place] council tip catheter. The 9 [place] council-tip would not pass, therefore a 37 [place] was passed without difficulty. The balloon was inflated and seated at the bladder neck. The wire was removed. The catheter irrigated normally. The urine was clear.  The patient is on Levaquin. The catheter was secured to the patient's leg.  I discussed the findings with the wife.

## 2013-09-26 ENCOUNTER — Inpatient Hospital Stay (HOSPITAL_COMMUNITY): Payer: Medicare Other

## 2013-09-26 DIAGNOSIS — I471 Supraventricular tachycardia: Secondary | ICD-10-CM

## 2013-09-26 LAB — GLUCOSE, CAPILLARY
GLUCOSE-CAPILLARY: 183 mg/dL — AB (ref 70–99)
GLUCOSE-CAPILLARY: 188 mg/dL — AB (ref 70–99)
GLUCOSE-CAPILLARY: 172 mg/dL — AB (ref 70–99)
Glucose-Capillary: 190 mg/dL — ABNORMAL HIGH (ref 70–99)

## 2013-09-26 LAB — TROPONIN I
Troponin I: 0.79 ng/mL (ref <0.30)
Troponin I: 1.01 ng/mL (ref <0.30)

## 2013-09-26 LAB — SODIUM, URINE, RANDOM: SODIUM UR: 6 meq/L

## 2013-09-26 LAB — CBC
HCT: 32.2 % — ABNORMAL LOW (ref 39.0–52.0)
Hemoglobin: 10 g/dL — ABNORMAL LOW (ref 13.0–17.0)
MCH: 28.4 pg (ref 26.0–34.0)
MCHC: 31.1 g/dL (ref 30.0–36.0)
MCV: 91.5 fL (ref 78.0–100.0)
Platelets: 104 10*3/uL — ABNORMAL LOW (ref 150–400)
RBC: 3.52 MIL/uL — ABNORMAL LOW (ref 4.22–5.81)
RDW: 20.4 % — AB (ref 11.5–15.5)
WBC: 14.3 10*3/uL — AB (ref 4.0–10.5)

## 2013-09-26 LAB — CREATININE, URINE, RANDOM: CREATININE, URINE: 6 mg/dL

## 2013-09-26 MED ORDER — CARVEDILOL 3.125 MG PO TABS
3.1250 mg | ORAL_TABLET | Freq: Two times a day (BID) | ORAL | Status: DC
  Filled 2013-09-26 (×3): qty 1

## 2013-09-26 MED ORDER — METOPROLOL TARTRATE 1 MG/ML IV SOLN
2.5000 mg | Freq: Four times a day (QID) | INTRAVENOUS | Status: DC | PRN
  Administered 2013-09-27: 2.5 mg via INTRAVENOUS
  Filled 2013-09-26: qty 5

## 2013-09-26 MED ORDER — FUROSEMIDE 40 MG PO TABS
40.0000 mg | ORAL_TABLET | Freq: Once | ORAL | Status: AC
  Administered 2013-09-26: 40 mg via ORAL
  Filled 2013-09-26: qty 1

## 2013-09-26 MED ORDER — CARVEDILOL 3.125 MG PO TABS
3.1250 mg | ORAL_TABLET | Freq: Two times a day (BID) | ORAL | Status: DC
  Filled 2013-09-26 (×2): qty 1

## 2013-09-26 MED ORDER — ALBUMIN HUMAN 25 % IV SOLN
25.0000 g | Freq: Once | INTRAVENOUS | Status: AC
  Administered 2013-09-26: 25 g via INTRAVENOUS
  Filled 2013-09-26: qty 100

## 2013-09-26 MED ORDER — FUROSEMIDE 10 MG/ML IJ SOLN
100.0000 mg | Freq: Two times a day (BID) | INTRAVENOUS | Status: DC
  Filled 2013-09-26 (×2): qty 10

## 2013-09-26 MED ORDER — METOPROLOL TARTRATE 1 MG/ML IV SOLN
2.5000 mg | Freq: Four times a day (QID) | INTRAVENOUS | Status: DC
  Administered 2013-09-26: 2.5 mg via INTRAVENOUS
  Filled 2013-09-26: qty 5

## 2013-09-26 MED ORDER — CARVEDILOL 3.125 MG PO TABS
1.5600 mg | ORAL_TABLET | Freq: Two times a day (BID) | ORAL | Status: DC
  Administered 2013-09-27 (×2): 1.56 mg via ORAL
  Filled 2013-09-26 (×4): qty 0.5

## 2013-09-26 NOTE — Progress Notes (Signed)
Assessment/Plan:  1. AKI- oliguric: possibly Contrast-induced nephropathy and hypotension                Plan: limited options given co morbidities, but will give bolus of albumin followed by furosemide; stop IVF,              Rec: Hold Coreg for hypotension  Interval History: Ultrasound no obstruction   Objective: Vital signs in last 24 hours: Temp:  [97.9 F (36.6 [name])-98.2 F (36.8 [name])] 98.2 F (36.8 [name]) (02/22 0506) Pulse Rate:  [61-125] 125 (02/22 0506) Resp:  [16] 16 (02/22 0506) BP: (93-114)/(38-56) 93/56 mmHg (02/22 0506) SpO2:  [95 %-100 %] 100 % (02/22 0506) Weight change:   Intake/Output from previous day: 02/21 0701 - 02/22 0700 In: 708.3 [P.O.:130; I.V.:578.3] Out: 270 [Urine:270] Intake/Output this shift:   General appearance: alert and cooperative Resp: diminished breath sounds bilaterally Chest wall: no tenderness Extremities: upper arms edematous, legs without edema  Lab Results:  Recent Labs  09/25/13 2007 09/26/13 0600  WBC 11.8* 14.3*  HGB 9.5* 10.0*  HCT 30.2* 32.2*  PLT 84* 104*   BMET:  Recent Labs  09/25/13 1258 09/25/13 2010  NA 142 143  K 5.3 5.3  CL 96 96  CO2 37* 37*  GLUCOSE 251* 238*  BUN 90* 88*  CREATININE 2.43* 2.63*  CALCIUM 8.1* 8.1*   No results found for this basename: PTH,  in the last 72 hours Iron Studies: No results found for this basename: IRON, TIBC, TRANSFERRIN, FERRITIN,  in the last 72 hours Studies/Results: US Renal  09/26/2013   CLINICAL DATA:  Renal insufficiency; assess for obstructive uropathy.  EXAM: RENAL/URINARY TRACT ULTRASOUND COMPLETE  COMPARISON:  None.  FINDINGS: Right Kidney:  Length: 9.5 cm. Diffusely increased parenchymal echogenicity, concerning for medical renal disease. No mass or hydronephrosis visualized.  Left Kidney:  Length: 8.9 cm. Diffusely increased parenchymal echogenicity, concerning for medical renal disease. No hydronephrosis visualized. A 0.9 cm cyst is noted at the interpole region of  the left kidney.  Bladder:  Decompressed, with a Foley catheter in place.  Bilateral pleural effusions are seen.  IMPRESSION: 1. No evidence of hydronephrosis. 2. Diffusely increased renal parenchymal echogenicity raises concern for medical renal disease. 3. Small left renal cyst seen. 4. Bilateral pleural effusions noted.   Electronically Signed   By: Roanna Raider M.D.   On: 09/26/2013 03:08   Dg Chest Port 1 View  09/26/2013   CLINICAL DATA:  Pleural effusions  EXAM: PORTABLE CHEST - 1 VIEW  COMPARISON:  09/20/2013  FINDINGS: Left subclavian 2 lead pacer noted. Mild cardiomegaly but normal vascularity. Moderate pleural effusions noted layering posteriorly with associated bibasilar atelectasis. Dense left lower lobe collapse/ consolidation process. No pneumothorax. Atherosclerosis of the aorta and degenerative changes of the spine and shoulders. Right PICC line tip at the upper SVC level.  IMPRESSION: Persistent bilateral pleural effusions with bibasilar atelectasis and dense left lower lobe collapse/ consolidation.   Electronically Signed   By: Ruel Favors M.D.   On: 09/26/2013 07:38   Scheduled: . carvedilol  3.125 mg Oral BID WC  . feeding supplement (ENSURE COMPLETE)  237 mL Oral BID BM  . fluconazole  100 mg Oral Daily  . levofloxacin  750 mg Oral Q48H  . pantoprazole  40 mg Oral BID AC  . saccharomyces boulardii  250 mg Oral BID  . sodium chloride  10-40 mL Intracatheter Q12H     LOS: 8 days   Frederick [name]  09/26/2013,9:08 AM

## 2013-09-26 NOTE — Progress Notes (Signed)
Patient ID: Frederick [name], male   DOB: 12/08/1931, 80 y.o.   MRN: [id number]   Given patient's urethral false passage/foley catheter placement - I recommend leaving foley catheter for a minimum of 7 days. If patient is ambulatory at that time a void trial/foley removal could be attempted.

## 2013-09-26 NOTE — Progress Notes (Signed)
Primary Cardiologist:Dr. Allyson SabalBerry Admit date: 01-09-14 Referring Physician  Dr. Mahala MenghiniSamtani Primary Physician ADCOCK, Jarvis NewcomerJIMMIE, MD Reason for Consultation  abnormal EKG, nonischemic cardiomyopathy  HPI: [id number] year old male with nonischemic cardiomyopathy, cardiac catheterization on 04/19/13 demonstrating ejection fraction of 15-20 % with no significant coronary artery disease, pacemaker following severe bradycardia, several admissions, 3 times in the past 6 weeks, recent pneumonia, failure to thrive, sacral decubiti, cardiorenal syndrome, relative hypotension with ongoing heart failure. Creatinine is currently 2.63 up from 1.27 on 09/20/13. He did receive 80 mL of contrast earlier this admission. Appreciate nephrology's assistance, reviewed notes. Total body volume overload with intravascular volume depletion due to hypoalbuminemia. The candidate for hemodialysis. Recommendation was for palliative and hospice service.    PMH:   Past Medical History  Diagnosis Date  . Hypertension   . Diabetes mellitus   . Symptomatic bradycardia     s/p pacemaker  . CHF (congestive heart failure)   . Dysrhythmia   . Shortness of breath   . Pacemaker   . Anginal pain     PER PATIENT  . Pulmonary hypertension 04/15/2013  . HTN (hypertension) 08/06/2013  . Protein-calorie malnutrition, severe 08/06/2013  . Ex-cigarette smoker 08/08/2013    PSH:   Past Surgical History  Procedure Laterality Date  . Pacemaker insertion  06/16/2010    [facility]  . Neck surgery    . Joint replacement      bilateral hip  . Cholecystectomy     Allergies:  Review of patient's allergies indicates no known allergies. Prior to Admit Meds:   Prior to Admission medications   Medication Sig Start Date End Date Taking? Authorizing Provider  aspirin EC 81 MG EC tablet Take 1 tablet (81 mg total) by mouth daily. 04/21/13  Yes Brittainy Sharol HarnessSimmons, PA-C  carvedilol (COREG) 3.125 MG tablet Take 1 tablet (3.125 mg total) by  mouth 2 (two) times daily with a meal. 04/21/13  Yes Brittainy Simmons, PA-C  furosemide (LASIX) 40 MG tablet Take 1 tablet (40 mg total) by mouth 2 (two) times daily. 08/10/13  Yes Esperanza SheetsUlugbek N Buriev, MD  glipiZIDE (GLUCOTROL) 5 MG tablet Take 0.5 tablets (2.5 mg total) by mouth daily before breakfast. 08/10/13  Yes Esperanza SheetsUlugbek N Buriev, MD  lisinopril (PRINIVIL,ZESTRIL) 20 MG tablet Take 20 mg by mouth daily.    Yes Historical Provider, MD  oxyCODONE (OXY IR/ROXICODONE) 5 MG immediate release tablet Take 1 tablet (5 mg total) by mouth every 4 (four) hours as needed for moderate pain. 08/17/13  Yes Dorothea OgleIskra M Myers, MD   Fam HX:    Family History  Problem Relation Age of Onset  . Heart failure Mother    Social HX:    History   Social History  . Marital Status: Married    Spouse Name: N/A    Number of Children: N/A  . Years of Education: N/A   Occupational History  . Not on file.   Social History Main Topics  . Smoking status: Former Smoker    Types: Cigarettes    Quit date: 08/08/1972  . Smokeless tobacco: Former NeurosurgeonUser    Types: Snuff, Chew  . Alcohol Use: No  . Drug Use: No  . Sexual Activity: No   Other Topics Concern  . Not on file   Social History Narrative   ** Merged History Encounter **         ROS:  All 11 ROS were addressed and are negative except what is stated in the HPI  Physical Exam: Blood pressure 93/56, pulse 125, temperature 98.2 F (36.8 C), temperature source Axillary, resp. rate 16, height 5\' 7"  (1.702 m), weight 130 lb (58.968 kg), SpO2 100.00%.   General: Ill-appearing, in no acute distress Head: Eyes PERRLA, No xanthomas.   Normal cephalic and atramatic  Lungs:   Clear bilaterally to auscultation and percussion. Normal respiratory effort. No wheezes, no rales. Heart:   HRRR S1 S2 Pulses are 2+ & equal. No murmur, rubs, gallops.  No carotid bruit. No JVD.  No abdominal bruits.  Abdomen: Bowel sounds are positive, abdomen soft and non-tender without masses.  No hepatosplenomegaly. Msk:  Decubiti noted Extremities:  No clubbing, cyanosis or edema.  DP +1 Neuro: Alert and oriented X 3, non-focal, MAE x 4 GU: Deferred Rectal: Deferred Psych:  Good affect, responds appropriately      Labs: Lab Results  Component Value Date   WBC 14.3* 09/26/2013   HGB 10.0* 09/26/2013   HCT 32.2* 09/26/2013   MCV 91.5 09/26/2013   PLT 104* 09/26/2013     Recent Labs Lab 09/23/13 0520  09/25/13 2010  NA 142  < > 143  K 4.6  < > 5.3  CL 92*  < > 96  CO2 38*  < > 37*  BUN 58*  < > 88*  CREATININE 1.51*  < > 2.63*  CALCIUM 8.9  < > 8.1*  PROT 5.8*  --   --   BILITOT 1.2  --   --   ALKPHOS 92  --   --   ALT 16  --   --   AST 22  --   --   GLUCOSE 76  < > 238*  < > = values in this interval not displayed.  Recent Labs  09/26/13 0845  TROPONINI 0.79*   Lab Results  Component Value Date   CHOL 132 08/06/2013   HDL 54 08/06/2013   LDLCALC 60 08/06/2013   TRIG 91 08/06/2013   Lab Results  Component Value Date   DDIMER 1.60* 05/25/2011     Radiology:  US Renal  09/26/2013   CLINICAL DATA:  Renal insufficiency; assess for obstructive uropathy.  EXAM: RENAL/URINARY TRACT ULTRASOUND COMPLETE  COMPARISON:  None.  FINDINGS: Right Kidney:  Length: 9.5 cm. Diffusely increased parenchymal echogenicity, concerning for medical renal disease. No mass or hydronephrosis visualized.  Left Kidney:  Length: 8.9 cm. Diffusely increased parenchymal echogenicity, concerning for medical renal disease. No hydronephrosis visualized. A 0.9 cm cyst is noted at the interpole region of the left kidney.  Bladder:  Decompressed, with a Foley catheter in place.  Bilateral pleural effusions are seen.  IMPRESSION: 1. No evidence of hydronephrosis. 2. Diffusely increased renal parenchymal echogenicity raises concern for medical renal disease. 3. Small left renal cyst seen. 4. Bilateral pleural effusions noted.   Electronically Signed   By: Roanna Raider M.D.   On: 09/26/2013 03:08   Dg  Chest Port 1 View  09/26/2013   CLINICAL DATA:  Pleural effusions  EXAM: PORTABLE CHEST - 1 VIEW  COMPARISON:  09/20/2013  FINDINGS: Left subclavian 2 lead pacer noted. Mild cardiomegaly but normal vascularity. Moderate pleural effusions noted layering posteriorly with associated bibasilar atelectasis. Dense left lower lobe collapse/ consolidation process. No pneumothorax. Atherosclerosis of the aorta and degenerative changes of the spine and shoulders. Right PICC line tip at the upper SVC level.  IMPRESSION: Persistent bilateral pleural effusions with bibasilar atelectasis and dense left lower lobe collapse/ consolidation.   Electronically Signed  By: Ruel Favors M.D.   On: 09/26/2013 07:38   Personally viewed.  EKG:  EKG today shows tachycardia heart rate 119, right bundle branch block morphology, could be prolonged PR interval or perhaps accelerated junctional rhythm, T-wave inversion in the lateral leads, precordial. Personally viewed.   ASSESSMENT/PLAN:   80 year old with multiple medical problems including acute kidney injury, nonischemic cardiomyopathy, severe, decubiti, SVT.  1. SVT-I agree with resuming Coreg. Avoiding ACE inhibitor because of worsening creatinine. Troublesome situation. Low output/cardiorenal syndrome. If he is getting carvedilol once again, he may not need metoprolol 2.5 mg IV every 6 hours injections. Currently is back in NSR. Excellent. OK to Use IV metoprolol push if needed to break tachycardia in future.   2. Abnormal EKG-T-wave inversions are likely results of tachycardia. Recent cardiac catheterization showed no CAD. Also this could be secondary to nonischemic cardiomyopathy and underlying metabolic derangement.  3. I agree with nephrology, strongly pursue palliative care. He appears to be in the end stages of his life.   Donato Schultz, MD  09/26/2013  9:46 AM

## 2013-09-26 NOTE — Progress Notes (Signed)
CRITICAL VALUE ALERT  Critical value received:  Troponin 0.79  Date of notification:  09/26/2013  Time of notification: 0921  Critical value read back:yes  Nurse who received alert:  Lollie Marrow  MD notified (1st page): dr. Mahala Menghini  Time of first page: 0922  MD notified (2nd page):  Time of second page:  Responding MD:dr. Mahala Menghini  Time MD responded: [phone]

## 2013-09-26 NOTE — Progress Notes (Signed)
[facility] PROGRESS NOTE  Gillis EndsRobert Lonsway ZOX:[id number] DOB: 08/20/1931 DOA: 01/08/14 PCP: Arvella NighADCOCK, JIMMIE, MD  Brief Summary  80 y/o ? PMH htn, DM ty 2, PPM insertion bradycardia, severe NICM  EF of 15-20%, ~4 hospitalizations recently + rapid decline.  He was discharged from the [facility] to home at the request of his debilitated wife who also has a caregiver.  He was unable to care for himself and he had previously been her caretaker.  He complained of increasing pain on his sacrum from his large stage III sacral decub so he returned to the hospital.   Rx for HCAP, assessing fluid status.   He required transfusion of blood due to GI losses, but he is not a good candidate for colo or surgery.  Per wife, [facility]  Multiple Co-morbdis including heart failure, weight loss, severe malnutrition, rapid decline, recurrent anemia wife has repeatedly declined SNF/Hospice/Palliative care.  He is DNR.  g.     Multiple discussions with wife regarding exceedingly poor prognosis and high likelihood to be readmitted for compensated heart failure-patient's wife still wishes to take the patient home as she has had bad feelings and notions about nursing home care   Assessment/Plan  Oliguric/and uric ATN-potentially postobstructive component -Potentially secondary to IV contrast secondary to CT angiogram -  FENa:  Prerenal initially -suspect this is new baseline 2/2 to need for diuresis -Nephrology consulted 2/21-recommended IV albumin/IV Lasix one-time dose-unfortunately cannot aggressively diurese given hypotension-Diuresis limited by hypotension-by mouth Lasix 40 mg given 2/22 -IV saline started for fluid repletion on 2/21 but was DC as developing anasarca -Creatinine continues to rise Renal ultrasound 2/20 = medical renal disease no evidence hydronephrosis  Paroxysmal SVT, heart rate 160 -Likely secondary to beta blocker withdrawal -Troponin secondary to demand  ischemia -EKG showed T wave inversions, however repeat EKG showed resolution 2/22 -Continue by mouth Coreg 1.5 6 twice a day - IV metoprolol 2.5 every 6 when necessary her blood pressure-specific instructions delineated regarding dosing IV metoprolol  Acute hypoxic respiratory failure due to HCAP (healthcare-associated pneumonia) vs. atelectasis.  CTA negative for PE.   -  BCx NGTD -  Flu neg -  S. pneumo neg -  Legionella neg - Narrow IV vancomycin and IV cefepime 2/18 to Levaquin PO and reassess WBC in am and fever curve -Antibiotics stop date 09/27/13=10 days for Hcap  Lethargy and confusion ? to infection or heart failure-sporadically and resolves spontaneously. -  ABG with mild hypercapnea, however, pH elevated.  Suspect chronic CO2 retention -  Ammonia 25  Chronic systolic heart failure with severe NICM EF 15-20% s/p pacemaker for bradycardia Recent admission for heart failure. Discharge weight of 126-lbs and currently 130lbs.    Has pronounced JVP and effusions on CXR, however, no overt pulmonary edema.  proBNP > 70K.   - See above discussion-hypotension limits diuresis - Held aldactone 12.5 mg given volume depletion - Net positive 2.5-difficult situation with cardiorenal syndrome -CXR shows LL consolidation/collapse-Order incentive spirometry when awak   Blood leaking from urethra likely due to I/O cath trauma, -  Developed recurrent GU bleed after palcing foley for assesment renal/vol status - S./P. cystoscopy , placement of Foley over wire -  Nurse to monitor uop and obtain PVR if uop decreases.  If increasing residuals, notify MD for orders  Hypertension, at risk for hypotension -  Restart coreg 1.5 twice a day -  Hold ACEI due to AKI  T2DM, hypoglycemic A1c 9.2  last month -SSI sensitive re-started 2/21 given mild hyprglycemia -  D/c levemir -Liberalize diet patient is taking in less than 50% of all meals   Bilateral hip and knee pain, worse than baseline -  X-rays  of hip and knee:  No fracture  Left arm basilic vein thrombus -  Wife declines a/c b/c she feels it caused the large bruise of his right side  Deconditioning due to heart failure, recurrent infections, severe protein calorie malnutrition -  PT/OT assessments consistently recommned SNF  Multiple pressure ulcers, feet, sacrum, back -  Appreciate wound care recs -  Air mattress  -  Booties -Will look at decubitus and heels 2/20 3 AM  Severe protein calorie malnutrition -  Supplements, liberalized diet  Leukocytosis,improved since admission -  Trend WBC -  Continue abx, stop date 2/23  Normocytic anemia, recently received blood transfusion of 2 units from [place], occult stool positive.  Patient is not a good candidate for surgery or colonoscopy  -  Iron studies consistent with anemia of chronic disease but given occult positive stool, may be mixed picture -  Feraheme x 1  -  b12 1004 folate wnl, TSH 0.834 -  Occult stool positive -  S/p 2 units PRBC 09/20/13  Thrombocytopenia, acute phase reactant  Hypokalemia due to malnutrition, replete with oral potassium  Hypothermia due to severe heart failure  -  Warming blanket if needed  Thrush:  Continue oral fluconazole once daily, stop 2/26  Diet:  Dysphagia 1 Access:  PICC IVF:  yes Proph:  lovenox  Code Status: DNR Family Communication: No family present at bedside today Disposition unknown Consultants:  None  Procedures:  CXR  CT anio  XR knee right  XR bilateral hips  Cystoscopy and Foley placement 2/23  Antibiotics:  Vancomycin 2/15-2/18  Zosyn x 1 2/15  Cefepime 2/15 >> 2/18  Levaquin 2/18>>>2/21  Fluconazole 2/16>>>  HPI/Subjective:   looks ill Episodic SVT this morning associated with mild discomfort Troponin 0.7 Resolved with IV metoprolol push Tolerating diet poorly No other issues 1    Objective: Filed Vitals:   09/26/13 1000 09/26/13 1155 09/26/13 1317 09/26/13 1444  BP:  99/50 95/44  101/51  Pulse: 69 69 75 74  Temp:  97.9 F (36.6 C) 98.1 F (36.7 C)   TempSrc:  Oral Oral   Resp:  16 16   Height:      Weight:      SpO2:  100% 100%     Intake/Output Summary (Last 24 hours) at 09/26/13 1732 Last data filed at 09/26/13 1259  Gross per 24 hour  Intake 578.33 ml  Output    345 ml  Net 233.33 ml   Filed Weights   09/21/13 1246 09/22/13 0627 09/24/13 0508  Weight: 59.33 kg (130 lb 12.8 oz) 59.104 kg (130 lb 4.8 oz) 58.968 kg (130 lb)    Exam:   General:  Cachectic BM, No acute distress  HEENT:  NCAT, MMM, JVP to preauricular area   Cardiovascular:  Distant IRRR, nl S1, S2, + gallop, 1+ pulses, cool extremities  Respiratory:   Diminished bilateral BS, no wheezes or rhonchi, no increased WOB  Abdomen:   NABS, soft, NT/ND  MSK:   Decreased tone and bulk, no LEE, bilateral arms with 1+ nonpitting edema   Neuro:  Grossly intact  Skin:  Not examined today  Data Reviewed: Basic Metabolic Panel:  Recent Labs Lab 09/23/13 0520 09/24/13 0540 09/25/13 0400 09/25/13 1258 09/25/13 2010  NA  142 140 143 142 143  K 4.6 5.2 5.4* 5.3 5.3  CL 92* 92* 96 96 96  CO2 38* 36* 39* 37* 37*  GLUCOSE 76 206* 290* 251* 238*  BUN 58* 78* 88* 90* 88*  CREATININE 1.51* 1.99* 2.39* 2.43* 2.63*  CALCIUM 8.9 8.8 8.2* 8.1* 8.1*  PHOS  --  5.7*  --   --  6.2*   Liver Function Tests:  Recent Labs Lab 09/23/13 0520 09/24/13 0540 09/25/13 2010  AST 22  --   --   ALT 16  --   --   ALKPHOS 92  --   --   BILITOT 1.2  --   --   PROT 5.8*  --   --   ALBUMIN 2.3* 2.3* 2.2*   No results found for this basename: LIPASE, AMYLASE,  in the last 168 hours  Recent Labs Lab 09/19/13 1804  AMMONIA 25   CBC:  Recent Labs Lab 09/23/13 0520 09/24/13 0540 09/25/13 0400 09/25/13 2007 09/26/13 0600  WBC 11.8* 10.5 7.7 11.8* 14.3*  NEUTROABS  --   --  6.1  --   --   HGB 10.6* 10.6* 9.6* 9.5* 10.0*  HCT 34.4* 33.8* 31.2* 30.2* 32.2*  MCV 92.0 91.4 92.9 90.4 91.5   PLT 118* 126* 101* 84* 104*   Cardiac Enzymes:  Recent Labs Lab 09/26/13 0845  TROPONINI 0.79*   BNP (last 3 results)  Recent Labs  08/06/13 1121 08/12/13 1649 09/19/13 0100  PROBNP >70000.0* 61891.0* >70000.0*   CBG:  Recent Labs Lab 09/25/13 1120 09/25/13 1647 09/25/13 2217 09/26/13 0735 09/26/13 1137  GLUCAP 240* 201* 191* 190* 183*    Recent Results (from the past 240 hour(s))  CULTURE, BLOOD (ROUTINE X 2)     Status: None   Collection Time    09/19/13  5:05 AM      Result Value Ref Range Status   Specimen Description BLOOD RIGHT WRIST   Final   Special Requests BOTTLES DRAWN AEROBIC AND ANAEROBIC 5CC   Final   Culture  Setup Time     Final   Value: 09/19/2013 13:28     Performed at [facility]   Culture     Final   Value: NO GROWTH 5 DAYS     Performed at [facility]   Report Status 09/25/2013 FINAL   Final  CULTURE, BLOOD (ROUTINE X 2)     Status: None   Collection Time    09/19/13  5:18 AM      Result Value Ref Range Status   Specimen Description BLOOD BLOOD RIGHT FOREARM   Final   Special Requests BOTTLES DRAWN AEROBIC ONLY 4CC   Final   Culture  Setup Time     Final   Value: 09/19/2013 13:28     Performed at [facility]   Culture     Final   Value: NO GROWTH 5 DAYS     Performed at [facility]   Report Status 09/25/2013 FINAL   Final  MRSA PCR SCREENING     Status: None   Collection Time    09/19/13  6:37 AM      Result Value Ref Range Status   MRSA by PCR NEGATIVE  NEGATIVE Final   Comment:            The GeneXpert MRSA Assay (FDA     approved for NASAL specimens     only), is one component of a  comprehensive MRSA colonization     surveillance program. It is not     intended to diagnose MRSA     infection nor to guide or     monitor treatment for     MRSA infections.     Studies: US Renal  09/26/2013   CLINICAL DATA:  Renal insufficiency; assess for obstructive uropathy.  EXAM:  RENAL/URINARY TRACT ULTRASOUND COMPLETE  COMPARISON:  None.  FINDINGS: Right Kidney:  Length: 9.5 cm. Diffusely increased parenchymal echogenicity, concerning for medical renal disease. No mass or hydronephrosis visualized.  Left Kidney:  Length: 8.9 cm. Diffusely increased parenchymal echogenicity, concerning for medical renal disease. No hydronephrosis visualized. A 0.9 cm cyst is noted at the interpole region of the left kidney.  Bladder:  Decompressed, with a Foley catheter in place.  Bilateral pleural effusions are seen.  IMPRESSION: 1. No evidence of hydronephrosis. 2. Diffusely increased renal parenchymal echogenicity raises concern for medical renal disease. 3. Small left renal cyst seen. 4. Bilateral pleural effusions noted.   Electronically Signed   By: Roanna Raider M.D.   On: 09/26/2013 03:08   Dg Chest Port 1 View  09/26/2013   CLINICAL DATA:  Pleural effusions  EXAM: PORTABLE CHEST - 1 VIEW  COMPARISON:  09/20/2013  FINDINGS: Left subclavian 2 lead pacer noted. Mild cardiomegaly but normal vascularity. Moderate pleural effusions noted layering posteriorly with associated bibasilar atelectasis. Dense left lower lobe collapse/ consolidation process. No pneumothorax. Atherosclerosis of the aorta and degenerative changes of the spine and shoulders. Right PICC line tip at the upper SVC level.  IMPRESSION: Persistent bilateral pleural effusions with bibasilar atelectasis and dense left lower lobe collapse/ consolidation.   Electronically Signed   By: Ruel Favors M.D.   On: 09/26/2013 07:38    Scheduled Meds: . carvedilol  1.56 mg Oral BID WC  . feeding supplement (ENSURE COMPLETE)  237 mL Oral BID BM  . fluconazole  100 mg Oral Daily  . levofloxacin  750 mg Oral Q48H  . pantoprazole  40 mg Oral BID AC  . saccharomyces boulardii  250 mg Oral BID  . 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

## 2013-09-27 LAB — COMPREHENSIVE METABOLIC PANEL
ALT: 31 U/L (ref 0–53)
AST: 24 U/L (ref 0–37)
Albumin: 2.6 g/dL — ABNORMAL LOW (ref 3.5–5.2)
Alkaline Phosphatase: 154 U/L — ABNORMAL HIGH (ref 39–117)
BUN: 104 mg/dL — ABNORMAL HIGH (ref 6–23)
CALCIUM: 8.3 mg/dL — AB (ref 8.4–10.5)
CO2: 35 meq/L — AB (ref 19–32)
CREATININE: 3.04 mg/dL — AB (ref 0.50–1.35)
Chloride: 97 mEq/L (ref 96–112)
GFR calc non Af Amer: 18 mL/min — ABNORMAL LOW (ref >90)
GFR, EST AFRICAN AMERICAN: 21 mL/min — AB (ref >90)
Glucose, Bld: 179 mg/dL — ABNORMAL HIGH (ref 70–99)
Potassium: 5.1 mEq/L (ref 3.7–5.3)
Sodium: 143 mEq/L (ref 137–147)
TOTAL PROTEIN: 5.3 g/dL — AB (ref 6.0–8.3)
Total Bilirubin: 0.7 mg/dL (ref 0.3–1.2)

## 2013-09-27 LAB — CBC
HCT: 30 % — ABNORMAL LOW (ref 39.0–52.0)
HEMOGLOBIN: 9 g/dL — AB (ref 13.0–17.0)
MCH: 28.1 pg (ref 26.0–34.0)
MCHC: 30 g/dL (ref 30.0–36.0)
MCV: 93.8 fL (ref 78.0–100.0)
PLATELETS: 76 10*3/uL — AB (ref 150–400)
RBC: 3.2 MIL/uL — AB (ref 4.22–5.81)
RDW: 20.4 % — ABNORMAL HIGH (ref 11.5–15.5)
WBC: 9.1 10*3/uL (ref 4.0–10.5)

## 2013-09-27 LAB — GLUCOSE, CAPILLARY
GLUCOSE-CAPILLARY: 164 mg/dL — AB (ref 70–99)
GLUCOSE-CAPILLARY: 191 mg/dL — AB (ref 70–99)
Glucose-Capillary: 203 mg/dL — ABNORMAL HIGH (ref 70–99)
Glucose-Capillary: 240 mg/dL — ABNORMAL HIGH (ref 70–99)

## 2013-09-27 LAB — HEMOGLOBIN AND HEMATOCRIT, BLOOD
HEMATOCRIT: 31 % — AB (ref 39.0–52.0)
HEMOGLOBIN: 9.5 g/dL — AB (ref 13.0–17.0)

## 2013-09-27 MED ORDER — CARVEDILOL 3.125 MG PO TABS
3.1250 mg | ORAL_TABLET | Freq: Two times a day (BID) | ORAL | Status: DC
  Administered 2013-09-28: 3.125 mg via ORAL
  Filled 2013-09-27 (×3): qty 1

## 2013-09-27 NOTE — Progress Notes (Signed)
[facility] PROGRESS NOTE  Frederick [name] ZOX:[id number] DOB: [birth year] DOA: 2013/10/02 PCP: Arvella Nigh, MD  Brief Summary  80 y/o ? PMH htn, DM ty 2, PPM insertion bradycardia, severe NICM  EF of 15-20%, ~4 hospitalizations recently + rapid decline.  He was discharged from the [place] to home at the request of his debilitated wife who also has a caregiver.  He was unable to care for himself and he had previously been her caretaker.  He complained of increasing pain on his sacrum from his large stage III sacral decub so he returned to the hospital.   Rx for HCAP, assessing fluid status.   He required transfusion of blood due to GI losses, but he is not a good candidate for colo or surgery.  Per wife, he was transfused at the [facility] for same reason.  Multiple Co-morbdis including heart failure, weight loss, severe malnutrition, rapid decline, recurrent anemia wife has repeatedly declined SNF/Hospice/Palliative care.  He is DNR.   Recently    Multiple discussions with wife regarding exceedingly poor prognosis and high likelihood to be readmitted for compensated heart failure   Assessment/Plan  Oliguric/and uric ATN-potentially postobstructive component -Potentially secondary to IV contrast secondary to CT angiogram -  FENa:  Prerenal initially -suspect this is new baseline 2/2 to need for diuresis -Nephrology consulted 2/21-recommended IV albumin/IV Lasix one-time dose-unfortunately cannot aggressively diurese given hypotension-Diuresis limited by hypotension-by mouth Lasix 40 mg given 2/22 -IV saline started for fluid repletion on 2/21 but was DC as developing anasarca -Creatinine continues to rise Renal ultrasound 2/20 = medical renal disease no evidence hydronephrosis  Paroxysmal SVT, heart rate 160 -Likely secondary to beta blocker withdrawal -Troponin secondary to demand ischemia -EKG showed T wave inversions, however repeat EKG showed resolution 2/22 -increased Coreg 1.56-back to 3.25   twice a day - IV metoprolol 2.5 every 6 when necessary her blood pressure-specific instructions delineated regarding dosing IV metoprolol  Acute hypoxic respiratory failure due to HCAP (healthcare-associated pneumonia) vs. atelectasis.  CTA negative for PE.   -  BCx NGTD -  Flu neg -  S. pneumo neg -  Legionella neg - Narrow IV vancomycin and IV cefepime 2/18 to Levaquin PO and reassess WBC in am and fever curve -Antibiotics stop date 09/27/13=10 days for Hcap  Lethargy and confusion ? to infection or heart failure-sporadically and resolves spontaneously. -  ABG with mild hypercapnea, however, pH elevated.  Suspect chronic CO2 retention -  Ammonia 25  Chronic systolic heart failure with severe NICM EF 15-20% s/p pacemaker for bradycardia Recent admission for heart failure. Discharge weight of 126-lbs and currently 138lbs.    Has pronounced JVP and effusions on CXR, however, no overt pulmonary edema.  proBNP > 70K.   - See above discussion-hypotension limits diuresis - Held aldactone 12.5 mg given volume depletion  - Net positive 2.5-difficult situation with cardiorenal syndrome -CXR shows LL consolidation/collapse-Order incentive spirometry when awak  Blood leaking from urethra likely due to I/O cath trauma, -  Developed recurrent GU bleed after palcing foley for assesment renal/vol status - S./P. cystoscopy , placement of Foley over wire -  Nurse to monitor uop and obtain PVR if uop decreases.  If increasing residuals, notify MD for orders  Hypertension, at risk for hypotension -  Restart coreg 3.25 twice a day -  Hold ACEI due to AKI  T2DM, hypoglycemic A1c 9.2 last month -SSI sensitive re-started 2/21 given mild hyprglycemia -  D/c levemir -Liberalize diet patient is taking in less  than 50% of all meals   Bilateral hip and knee pain, worse than baseline -  X-rays of hip and knee:  No fracture  Left arm basilic vein thrombus -  Wife declines a/c b/c she feels it caused the  large bruise of his right side  Deconditioning due to heart failure, recurrent infections, severe protein calorie malnutrition -  PT/OT assessments consistently recommned SNF  Multiple pressure ulcers, feet, sacrum, back -  Appreciate wound care recs -  Air mattress  -  Booties -Will look at decubitus and heels 2/20 3 AM  Severe protein calorie malnutrition -  Supplements, liberalized diet  Leukocytosis,improved since admission -  Trend WBC -  Continue abx, stop date 2/23  Normocytic anemia, recently received blood transfusion of 2 units from [place], occult stool positive.  Patient is not a good candidate for surgery or colonoscopy  -  Iron studies consistent with anemia of chronic disease but given occult positive stool, may be mixed picture -  Feraheme x 1  -  b12 1004 folate wnl, TSH 0.834 -  Occult stool positive -  S/p 2 units PRBC 09/20/13  Thrombocytopenia, acute phase reactant  Hypokalemia due to malnutrition, replete with oral potassium  Hypothermia due to severe heart failure  -  Warming blanket if needed  Thrush:  Continue oral fluconazole once daily, stop 2/26  Diet:  Dysphagia 1 Access:  PICC IVF:  yes Proph:  lovenox  Code Status: DNR Family Communication: No family present at bedside today Disposition unknown Consultants:  None  Procedures:  CXR  CT anio  XR knee right  XR bilateral hips  Cystoscopy and Foley placement 2/23  Antibiotics:  Vancomycin 2/15-2/18  Zosyn x 1 2/15  Cefepime 2/15 >> 2/18  Levaquin 2/18>>>2/21  Fluconazole 2/16>>>  HPI/Subjective:   looks ill Episodic SVT this morning associated with mild discomfort Troponin 0.7 Resolved with IV metoprolol push Tolerating diet poorly No other issues 1    Objective: Filed Vitals:   09/27/13 0806 09/27/13 1021 09/27/13 1134 09/27/13 1452  BP: 106/47 106/47 104/61 94/56  Pulse: 69 69 114 118  Temp:  97.7 F (36.5 C)  97.6 F (36.4 C)  TempSrc:  Oral  Oral  Resp:   16 15 16   Height:      Weight:      SpO2:  100% 100% 100%    Intake/Output Summary (Last 24 hours) at 09/27/13 1648 Last data filed at 09/27/13 1300  Gross per 24 hour  Intake    120 ml  Output    150 ml  Net    -30 ml   Filed Weights   09/22/13 0627 09/24/13 0508 09/27/13 0520  Weight: 59.104 kg (130 lb 4.8 oz) 58.968 kg (130 lb) 62.869 kg (138 lb 9.6 oz)    Exam:   General:  Cachectic BM, No acute distress  HEENT:  NCAT, MMM, JVP to preauricular area   Cardiovascular:  Distant IRRR, nl S1, S2, + gallop, 1+ pulses, cool extremities  Respiratory:   Diminished bilateral BS, no wheezes or rhonchi, no increased WOB  Abdomen:   NABS, soft, NT/ND  MSK:   Decreased tone and bulk, no LEE, bilateral arms with 1+ nonpitting edema   Neuro:  Grossly intact  Skin:  Not examined today  Data Reviewed: Basic Metabolic Panel:  Recent Labs Lab 09/24/13 0540 09/25/13 0400 09/25/13 1258 09/25/13 2010 09/27/13 0615  NA 140 143 142 143 143  K 5.2 5.4* 5.3 5.3 5.1  CL  92* 96 96 96 97  CO2 36* 39* 37* 37* 35*  GLUCOSE 206* 290* 251* 238* 179*  BUN 78* 88* 90* 88* 104*  CREATININE 1.99* 2.39* 2.43* 2.63* 3.04*  CALCIUM 8.8 8.2* 8.1* 8.1* 8.3*  PHOS 5.7*  --   --  6.2*  --    Liver Function Tests:  Recent Labs Lab 09/23/13 0520 09/24/13 0540 09/25/13 2010 09/27/13 0615  AST 22  --   --  24  ALT 16  --   --  31  ALKPHOS 92  --   --  154*  BILITOT 1.2  --   --  0.7  PROT 5.8*  --   --  5.3*  ALBUMIN 2.3* 2.3* 2.2* 2.6*   No results found for this basename: LIPASE, AMYLASE,  in the last 168 hours No results found for this basename: AMMONIA,  in the last 168 hours CBC:  Recent Labs Lab 09/24/13 0540 09/25/13 0400 09/25/13 2007 09/26/13 0600 09/27/13 0615 09/27/13 1035  WBC 10.5 7.7 11.8* 14.3* 9.1  --   NEUTROABS  --  6.1  --   --   --   --   HGB 10.6* 9.6* 9.5* 10.0* 9.0* 9.5*  HCT 33.8* 31.2* 30.2* 32.2* 30.0* 31.0*  MCV 91.4 92.9 90.4 91.5 93.8  --    PLT 126* 101* 84* 104* 76*  --    Cardiac Enzymes:  Recent Labs Lab 09/26/13 0845 09/26/13 1745  TROPONINI 0.79* 1.01*   BNP (last 3 results)  Recent Labs  08/06/13 1121 08/12/13 1649 09/19/13 0100  PROBNP >70000.0* 61891.0* >70000.0*   CBG:  Recent Labs Lab 09/26/13 1137 09/26/13 1706 09/26/13 2126 09/27/13 0750 09/27/13 1131  GLUCAP 183* 188* 172* 164* 203*    Recent Results (from the past 240 hour(s))  CULTURE, BLOOD (ROUTINE X 2)     Status: None   Collection Time    09/19/13  5:05 AM      Result Value Ref Range Status   Specimen Description BLOOD RIGHT WRIST   Final   Special Requests BOTTLES DRAWN AEROBIC AND ANAEROBIC 5CC   Final   Culture  Setup Time     Final   Value: 09/19/2013 13:28     Performed at [facility]   Culture     Final   Value: NO GROWTH 5 DAYS     Performed at [facility]   Report Status 09/25/2013 FINAL   Final  CULTURE, BLOOD (ROUTINE X 2)     Status: None   Collection Time    09/19/13  5:18 AM      Result Value Ref Range Status   Specimen Description BLOOD BLOOD RIGHT FOREARM   Final   Special Requests BOTTLES DRAWN AEROBIC ONLY 4CC   Final   Culture  Setup Time     Final   Value: 09/19/2013 13:28     Performed at [facility]   Culture     Final   Value: NO GROWTH 5 DAYS     Performed at [facility]   Report Status 09/25/2013 FINAL   Final  MRSA PCR SCREENING     Status: None   Collection Time    09/19/13  6:37 AM      Result Value Ref Range Status   MRSA by PCR NEGATIVE  NEGATIVE Final   Comment:            The GeneXpert MRSA Assay (FDA     approved  for NASAL specimens     only), is one component of a     comprehensive MRSA colonization     surveillance program. It is not     intended to diagnose MRSA     infection nor to guide or     monitor treatment for     MRSA infections.     Studies: [place] Renal  09/26/2013   CLINICAL DATA:  Renal insufficiency; assess for obstructive  uropathy.  EXAM: RENAL/URINARY TRACT ULTRASOUND COMPLETE  COMPARISON:  None.  FINDINGS: Right Kidney:  Length: 9.5 cm. Diffusely increased parenchymal echogenicity, concerning for medical renal disease. No mass or hydronephrosis visualized.  Left Kidney:  Length: 8.9 cm. Diffusely increased parenchymal echogenicity, concerning for medical renal disease. No hydronephrosis visualized. A 0.9 cm cyst is noted at the interpole region of the left kidney.  Bladder:  Decompressed, with a Foley catheter in place.  Bilateral pleural effusions are seen.  IMPRESSION: 1. No evidence of hydronephrosis. 2. Diffusely increased renal parenchymal echogenicity raises concern for medical renal disease. 3. Small left renal cyst seen. 4. Bilateral pleural effusions noted.   Electronically Signed   By: Roanna RaiderJeffery  Chang M.D.   On: 09/26/2013 03:08   Dg Chest Port 1 View  09/26/2013   CLINICAL DATA:  Pleural effusions  EXAM: PORTABLE CHEST - 1 VIEW  COMPARISON:  09/20/2013  FINDINGS: Left subclavian 2 lead pacer noted. Mild cardiomegaly but normal vascularity. Moderate pleural effusions noted layering posteriorly with associated bibasilar atelectasis. Dense left lower lobe collapse/ consolidation process. No pneumothorax. Atherosclerosis of the aorta and degenerative changes of the spine and shoulders. Right PICC line tip at the upper SVC level.  IMPRESSION: Persistent bilateral pleural effusions with bibasilar atelectasis and dense left lower lobe collapse/ consolidation.   Electronically Signed   By: Ruel Favorsrevor  Shick M.D.   On: 09/26/2013 07:38    Scheduled Meds: . carvedilol  1.56 mg Oral BID WC  . feeding supplement (ENSURE COMPLETE)  237 mL Oral BID BM  . pantoprazole  40 mg Oral BID AC  . saccharomyces boulardii  250 mg Oral BID  . 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

## 2013-09-27 NOTE — Progress Notes (Signed)
Subjective:  No further SVT from 9am yesterday. Mildly elevated troponin in the setting of SVT No complaints currently, no CP, no SOB  Prior notes reviewed.   Objective:  Vital Signs in the last 24 hours: Temp:  [97.9 F (36.6 C)-98.4 F (36.9 C)] 98.4 F (36.9 C) (02/22 2009) Pulse Rate:  [69-75] 69 (02/22 2009) Resp:  [16] 16 (02/22 2009) BP: (95-101)/(41-58) 100/58 mmHg (02/22 2009) SpO2:  [100 %] 100 % (02/22 2009)  Intake/Output from previous day: 02/22 0701 - 02/23 0700 In: -  Out: 200 [Urine:200]   Physical Exam: General: Elderly, frail, in no acute distress. Head:  Normocephalic and atraumatic. Lungs: Clear to auscultation and percussion. Heart: Normal S1 and S2.  Soft systolic murmur, no rubs or gallops.  Abdomen: soft, non-tender, positive bowel sounds. Extremities: No clubbing or cyanosis. No edema. Neurologic: Alert.    Lab Results:  Recent Labs  09/26/13 0600 09/27/13 0615  WBC 14.3* 9.1  HGB 10.0* 9.0*  PLT 104* 76*    Recent Labs  09/25/13 2010 09/27/13 0615  NA 143 143  K 5.3 5.1  CL 96 97  CO2 37* 35*  GLUCOSE 238* 179*  BUN 88* 104*  CREATININE 2.63* 3.04*    Recent Labs  09/26/13 0845 09/26/13 1745  TROPONINI 0.79* 1.01*   Hepatic Function Panel  Recent Labs  09/27/13 0615  PROT 5.3*  ALBUMIN 2.6*  AST 24  ALT 31  ALKPHOS 154*  BILITOT 0.7   Imaging: US Renal  09/26/2013   CLINICAL DATA:  Renal insufficiency; assess for obstructive uropathy.  EXAM: RENAL/URINARY TRACT ULTRASOUND COMPLETE  COMPARISON:  None.  FINDINGS: Right Kidney:  Length: 9.5 cm. Diffusely increased parenchymal echogenicity, concerning for medical renal disease. No mass or hydronephrosis visualized.  Left Kidney:  Length: 8.9 cm. Diffusely increased parenchymal echogenicity, concerning for medical renal disease. No hydronephrosis visualized. A 0.9 cm cyst is noted at the interpole region of the left kidney.  Bladder:  Decompressed, with a Foley  catheter in place.  Bilateral pleural effusions are seen.  IMPRESSION: 1. No evidence of hydronephrosis. 2. Diffusely increased renal parenchymal echogenicity raises concern for medical renal disease. 3. Small left renal cyst seen. 4. Bilateral pleural effusions noted.   Electronically Signed   By: Roanna Raider M.D.   On: 09/26/2013 03:08   Dg Chest Port 1 View  09/26/2013   CLINICAL DATA:  Pleural effusions  EXAM: PORTABLE CHEST - 1 VIEW  COMPARISON:  09/20/2013  FINDINGS: Left subclavian 2 lead pacer noted. Mild cardiomegaly but normal vascularity. Moderate pleural effusions noted layering posteriorly with associated bibasilar atelectasis. Dense left lower lobe collapse/ consolidation process. No pneumothorax. Atherosclerosis of the aorta and degenerative changes of the spine and shoulders. Right PICC line tip at the upper SVC level.  IMPRESSION: Persistent bilateral pleural effusions with bibasilar atelectasis and dense left lower lobe collapse/ consolidation.   Electronically Signed   By: Ruel Favors M.D.   On: 09/26/2013 07:38   Personally viewed.   Telemetry: No further SVT, a-paced Personally viewed.   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 EF of 20-25%   HTN (hypertension)   COPD (chronic obstructive pulmonary disease)   ARF (acute renal failure)   Protein-calorie malnutrition, severe   Pleural effusion   Prolonged immobilization   Decubitus ulcer  Physical deconditioning   Anemia of chronic disease   Anemia, iron deficiency  80 year old male with nonischemic cardiomyopathy, cardiac catheterization on 04/19/13 demonstrating ejection fraction of 15-20 % with no significant coronary artery disease, pacemaker following severe bradycardia, several admissions, 3 times in the past 6 weeks, recent  pneumonia, failure to thrive, sacral decubiti, cardiorenal syndrome/AKI, relative hypotension with ongoing heart failure.   1) SVT - much better with coreg. Continue. Try IV metop if needed in the situation of return of SVT.   2) Elevated troponin - demand in the setting of SVT. Prior cath with no CAD.   3) Cardiomyopathy/chronic systolic HF - appears well compensated. No SOB, no orthopnea. Difficult with worsening renal failure.  4) Renal failure/AKI - ?cardiorenal, low output, contrast? Nephology following. Avoiding ACE-I.   5) End of life issues - recommend palliative care. Family has been hesitant on prior occurrences.     Patti Shorb 09/27/2013, 7:44 AM

## 2013-09-27 NOTE — Progress Notes (Signed)
PT Cancellation Note  ___Treatment cancelled today due to medical issues with patient which prohibited therapy  ___ Treatment cancelled today due to patient receiving procedure or test   ___ Treatment cancelled today due to patient's refusal to participate   _X_ Treatment cancelled today due to pt's inability to tolerate.  Will consult LPT regarding cont need for acute PT considering pt's poor prognosis.  Felecia Shelling  PTA [facility]  Acute  Rehab Pager      [phone]

## 2013-09-27 NOTE — Progress Notes (Signed)
Patient DX:Frederick [name]      DOB: January 16, 1932      MVE:[id number]  Received consult from Dr. Mahala Menghini .  Family had been hesitant to talk with Palliative Medicine.  Patient has further declined and now they are willing to speak with [place].  I called spouse Frederick [name], she asked me to leave our number with her daughter Frederick [name].  Family now has our number and was asked to call with a time to meet tomorrow.  Daughter states "we do not want hospice, isn;t there a home health team that can help [place]?" directed her to CM in the am.     [name], MD MBA The Palliative Medicine Team at [facility] Phone: [phone] Pager: [phone]

## 2013-09-27 NOTE — Progress Notes (Signed)
  [facility] Progress Note   Subjective: UOP remains poor, 200 cc yesterday  Filed Vitals:   09/27/13 0806 09/27/13 1021 09/27/13 1134 09/27/13 1452  BP: 106/47 106/47 104/61 94/56  Pulse: 69 69 114 118  Temp:  97.7 F (36.5 C)  97.6 F (36.4 C)  TempSrc:  Oral  Oral  Resp:  16 15 16   Height:      Weight:      SpO2:  100% 100% 100%  Exam Alert, cachectic, frail, no distress Decreased BS bilat RRR no rub or gallop Abd soft, nt, nd Ext no LE edema, mild UE edema bilat Neuro grossly intact, gen weakness   Assessment: 1 Acute kidney injury- oliguric, due to contrast nephropathy and/or cardiorenal syndrome 2 Severe NICM 3 COPD 4 Decub ulcers 5 Debility 6 Hypotension, multifactorial 7 DNR  Rec- no further suggestions, poor prognosis. Not a candidate for dialysis. Will sign off.     Vinson Moselle MD  pager [phone]    cell [phone]  09/27/2013, 8:12 PM     Recent Labs Lab 09/24/13 0540  09/25/13 1258 09/25/13 2010 09/27/13 0615  NA 140  < > 142 143 143  K 5.2  < > 5.3 5.3 5.1  CL 92*  < > 96 96 97  CO2 36*  < > 37* 37* 35*  GLUCOSE 206*  < > 251* 238* 179*  BUN 78*  < > 90* 88* 104*  CREATININE 1.99*  < > 2.43* 2.63* 3.04*  CALCIUM 8.8  < > 8.1* 8.1* 8.3*  PHOS 5.7*  --   --  6.2*  --   < > = values in this interval not displayed.  Recent Labs Lab 09/23/13 0520 09/24/13 0540 09/25/13 2010 09/27/13 0615  AST 22  --   --  24  ALT 16  --   --  31  ALKPHOS 92  --   --  154*  BILITOT 1.2  --   --  0.7  PROT 5.8*  --   --  5.3*  ALBUMIN 2.3* 2.3* 2.2* 2.6*    Recent Labs Lab 09/25/13 0400 09/25/13 2007 09/26/13 0600 09/27/13 0615 09/27/13 1035  WBC 7.7 11.8* 14.3* 9.1  --   NEUTROABS 6.1  --   --   --   --   HGB 9.6* 9.5* 10.0* 9.0* 9.5*  HCT 31.2* 30.2* 32.2* 30.0* 31.0*  MCV 92.9 90.4 91.5 93.8  --   PLT 101* 84* 104* 76*  --    . [START ON 09/28/2013] carvedilol  3.125 mg Oral BID WC  . feeding supplement (ENSURE  COMPLETE)  237 mL Oral BID BM  . pantoprazole  40 mg Oral BID AC  . saccharomyces boulardii  250 mg Oral BID  . sodium chloride  10-40 mL Intracatheter Q12H     acetaminophen, bisacodyl, docusate sodium, metoprolol, oxyCODONE-acetaminophen, sodium chloride, traMADol

## 2013-09-27 NOTE — Progress Notes (Signed)
Calorie Count Note  48 hour calorie count ordered.  Diet: Dys1 Supplements: Ensure Complete BID  Breakfast: 0% Lunch: 35 kcal, 2 gram protein Dinner: 0% Supplements: 263 kcal, 10 gram protein  Total intake: 298 kcal (18% of minimum estimated needs)  12 gram protein (16% of minimum estimated needs)  Estimated needs:  Kcal: 1700-1900  Protein: 75-85 gram  Fluid: >/=1700 ml/daily  RN noted that pt continues with lethargy and SOB, both of which inhibit adequate PO intake. Pt noted some nausea during time of follow up. Staff has been attempting to encourage fluid and supplement intake   Nutrition Dx: Inadequate oral intake related to decreased appetite as evidenced by PO intake <75%-ongoing   Goal: Pt to meet >/= 90% of their estimated nutrition needs -not met   Intervention: -Continue with supplementation and staff encouragement of PO intake -Consider addition of appetite stimulant -Monitor POC-Pt likely not candidate for nutrition support d/t overall poor prognosis per MD documentation; will monitor family's wishes   Frederick [name] [place] LDN Clinical Dietitian [phone]

## 2013-09-28 LAB — CBC
HCT: 31.7 % — ABNORMAL LOW (ref 39.0–52.0)
Hemoglobin: 9.9 g/dL — ABNORMAL LOW (ref 13.0–17.0)
MCH: 28.5 pg (ref 26.0–34.0)
MCHC: 31.2 g/dL (ref 30.0–36.0)
MCV: 91.4 fL (ref 78.0–100.0)
PLATELETS: 79 10*3/uL — AB (ref 150–400)
RBC: 3.47 MIL/uL — AB (ref 4.22–5.81)
RDW: 21 % — AB (ref 11.5–15.5)
WBC: 10.4 10*3/uL (ref 4.0–10.5)

## 2013-09-28 LAB — GLUCOSE, CAPILLARY
Glucose-Capillary: 215 mg/dL — ABNORMAL HIGH (ref 70–99)
Glucose-Capillary: 235 mg/dL — ABNORMAL HIGH (ref 70–99)
Glucose-Capillary: 281 mg/dL — ABNORMAL HIGH (ref 70–99)
Glucose-Capillary: 246 mg/dL — ABNORMAL HIGH (ref 70–99)

## 2013-09-28 MED ORDER — AMIODARONE HCL 200 MG PO TABS
200.0000 mg | ORAL_TABLET | Freq: Two times a day (BID) | ORAL | Status: DC
  Administered 2013-09-28 – 2013-09-30 (×6): 200 mg via ORAL
  Filled 2013-09-28 (×14): qty 1

## 2013-09-28 NOTE — Progress Notes (Signed)
OT Cancellation Note and Discharge:  Patient Details Name: Frederick [name] MRN: [id number] DOB: Jan 15, 1932   Cancelled Treatment:    Reason Eval/Treat Not Completed: Other (comment)  Noted pt with decline in status.  He needs A x 2 for bed mobility and LB adls.Total A is required for adls.   Noted family wants home for discharge.  Pt would need 24/7; we are using 2 people to move and position him.  If they cannot provide this, recommend SNF.  Will sign off.  Deagen Krass 09/28/2013, 11:54 AM Marica Otter, OTR/L [phone] 09/28/2013

## 2013-09-28 NOTE — Progress Notes (Signed)
Physical Therapy Discharge Patient Details Name: Frederick [name] MRN: [id number] DOB: [birth year] Today's Date: 09/28/2013 Time:  -     Patient discharged from PT services secondary to medical decline - will need to re-order PT to resume therapy services.  Please see latest therapy progress note for current level of functioning and progress toward goals.      GP     Sharen Heck PT [phone]  09/28/2013, 7:34 AM

## 2013-09-28 NOTE — Progress Notes (Signed)
Patient Name: Frederick [name]      SUBJECTIVE: 80 year old male with nonischemic cardiomyopathy, cardiac catheterization on 04/19/13 demonstrating ejection fraction of 15-20 % with no significant coronary artery disease, pacemaker following severe bradycardia, several admissions, 3 times in the past 6 weeks, recent pneumonia, failure to thrive, sacral decubiti, cardiorenal syndrome/AKI and now worsing renal function not candidate for intervention relative hypotension with ongoing heart failure. And recurring transufsions  + Tn with SVT  Rx wi coreg     Past Medical History  Diagnosis Date  . Hypertension   . Diabetes mellitus   . Symptomatic bradycardia     s/p pacemaker  . CHF (congestive heart failure)   . Dysrhythmia   . Shortness of breath   . Pacemaker   . Anginal pain     PER PATIENT  . Pulmonary hypertension 04/15/2013  . HTN (hypertension) 08/06/2013  . Protein-calorie malnutrition, severe 08/06/2013  . Ex-cigarette smoker 08/08/2013    Scheduled Meds:  Scheduled Meds: . carvedilol  3.125 mg Oral BID WC  . feeding supplement (ENSURE COMPLETE)  237 mL Oral BID BM  . pantoprazole  40 mg Oral BID AC  . saccharomyces boulardii  250 mg Oral BID  . sodium chloride  10-40 mL Intracatheter Q12H   Continuous Infusions:   PHYSICAL EXAM Filed Vitals:   09/27/13 1134 09/27/13 1452 09/27/13 2153 09/28/13 0530  BP: 104/61 94/56 86/50  82/47  Pulse: 114 118 113 110  Temp:  97.6 F (36.4 C) 97.7 F (36.5 C) 97.5 F (36.4 C)  TempSrc:  Oral Axillary Oral  Resp: 15 16 16 16   Height:      Weight:    140 lb 12.8 oz (63.866 kg)  SpO2: 100% 100% 100% 100%    General appearance: alert, appears stated age and no distress Neck: no carotid bruit Lungs: clear to auscultation bilaterally Heart: regular rate and rhythm and rapid Extremities: edema none a Skin: Skin color, texture, turgor normal. No rashes or lesions Neurologic: Mental status: Alert, oriented, thought  content appropriate, alertness: alert      Intake/Output Summary (Last 24 hours) at 09/28/13 1111 Last data filed at 09/28/13 0535  Gross per 24 hour  Intake    180 ml  Output    150 ml  Net     30 ml    LABS: Basic Metabolic Panel:  Recent Labs Lab 09/22/13 0405 09/23/13 0520 09/24/13 0540 09/25/13 0400 09/25/13 1258 09/25/13 2010 09/27/13 0615  NA 141 142 140 143 142 143 143  K 3.9 4.6 5.2 5.4* 5.3 5.3 5.1  CL 95* 92* 92* 96 96 96 97  CO2 44* 38* 36* 39* 37* 37* 35*  GLUCOSE 90 76 206* 290* 251* 238* 179*  BUN 52* 58* 78* 88* 90* 88* 104*  CREATININE 1.31 1.51* 1.99* 2.39* 2.43* 2.63* 3.04*  CALCIUM 8.2* 8.9 8.8 8.2* 8.1* 8.1* 8.3*  PHOS  --   --  5.7*  --   --  6.2*  --    Cardiac Enzymes:  Recent Labs  09/26/13 0845 09/26/13 1745  TROPONINI 0.79* 1.01*   CBC:  Recent Labs Lab 09/23/13 0520 09/24/13 0540 09/25/13 0400 09/25/13 2007 09/26/13 0600 09/27/13 0615 09/27/13 1035 09/28/13 0520  WBC 11.8* 10.5 7.7 11.8* 14.3* 9.1  --  10.4  NEUTROABS  --   --  6.1  --   --   --   --   --   HGB 10.6* 10.6* 9.6* 9.5* 10.0*  9.0* 9.5* 9.9*  HCT 34.4* 33.8* 31.2* 30.2* 32.2* 30.0* 31.0* 31.7*  MCV 92.0 91.4 92.9 90.4 91.5 93.8  --  91.4  PLT 118* 126* 101* 84* 104* 76*  --  79*   PROTIME: No results found for this basename: LABPROT, INR,  in the last 72 hours Liver Function Tests:  Recent Labs  09/25/13 2010 09/27/13 0615  AST  --  24  ALT  --  31  ALKPHOS  --  154*  BILITOT  --  0.7  PROT  --  5.3*  ALBUMIN 2.2* 2.6*   No results found for this basename: LIPASE, AMYLASE,  in the last 72 hours BNP: BNP (last 3 results)  Recent Labs  08/06/13 1121 08/12/13 1649 09/19/13 0100  PROBNP >70000.0* 16384.5* >70000.0*   D-Dimer: No results found for this basename: DDIMER,  in the last 72 hours Hemoglobin A1C: No results found for this basename: HGBA1C,  in the last 72 hours Fasting Lipid Panel: No results found for this basename: CHOL, HDL,  LDLCALC, TRIG, CHOLHDL, LDLDIRECT,  in the last 72 hours Thyroid Function Tests: No results found for this basename: TSH, T4TOTAL, FREET3, T3FREE, THYROIDAB,  in the last 72 hours Anemia Panel: No results found for this basename: VITAMINB12, FOLATE, FERRITIN, TIBC, IRON, RETICCTPCT,  in the last 72 hours    ASSESSMENT AND PLAN:  Principal Problem:   HCAP (healthcare-associated pneumonia) Active Problems:   Cardiac pacemaker in situ, Biotronik   Cardiomyopathy, dilated- nonischemic w/ normal cath 04/19/13 EF of 20-25%   HTN (hypertension)   COPD (chronic obstructive pulmonary disease)   ARF (acute renal failure)   Protein-calorie malnutrition, severe   Pleural effusion   Prolonged immobilization   Decubitus ulcer   Physical deconditioning   Anemia of chronic disease   Anemia, iron deficiency    Carotidsiinus massage terminatedSVT  withBP precluding uptitration would recommend amio for rhtyhm control 200 bid  Will follow with you Signed, Sherryl Manges MD  09/28/2013

## 2013-09-28 NOTE — Progress Notes (Signed)
[facility] PROGRESS NOTE  Frederick [name] ZOX:[id number] DOB: 06/23/1932 DOA: 09/21/2013 PCP: Arvella Nigh, MD  Brief Summary  80 y/o ? PMH htn, DM ty 2, PPM insertion bradycardia, severe NICM  EF of 15-20%, ~4 hospitalizations recently + rapid decline.  He was discharged from the [place] to home at the request of his debilitated wife who also has a caregiver.  He was unable to care for himself and he had previously been her caretaker.  He complained of increasing pain on his sacrum from his large stage III sacral decub so he returned to the hospital.   Rx for HCAP, assessing fluid status.   He required transfusion of blood due to GI losses, but he is not a good candidate for colo or surgery.  Per wife, he was transfused at the [facility] for same reason.  Multiple Co-morbdis including heart failure, weight loss, severe malnutrition, rapid decline, recurrent anemia wife has repeatedly declined SNF/Hospice/Palliative care.  He is DNR.   Recently    Multiple discussions with wife regarding exceedingly poor prognosis and high likelihood to be readmitted for compensated heart failure   Assessment/Plan  Oliguric/and uric ATN-potentially postobstructive component -Potentially secondary to IV contrast secondary to CT angiogram - FENa:  Prerenal initially -Suspect this is new baseline 2/2 to need for diuresis -Nephrology consulted 2/21 and have signed off-recommended IV albumin/IV Lasix one-time dose-unfortunately cannot aggressively diurese given hypotension-Diuresis limited by hypotension-by mouth Lasix 40 mg given 2/22 -IV saline started for fluid repletion on 2/21 but was DC as developing anasarca -Creatinine continues to rise Renal ultrasound 2/20 = medical renal disease no evidence hydronephrosis  Paroxysmal SVT, heart rate 160 -Likely secondary to beta blocker withdrawal -Troponin secondary to demand ischemia -EKG showed T wave inversions, however repeat EKG showed resolution 2/22 -Carotid sinus  massage 2/24 -see cards note   Acute hypoxic respiratory failure due to HCAP (healthcare-associated pneumonia) vs. atelectasis.  CTA negative for PE.   -  BCx NGTD -  Flu neg -  S. pneumo neg -  Legionella neg - Narrow IV vancomycin and IV cefepime 2/18 to Levaquin PO and reassess WBC in am and fever curve -Antibiotics stop date 09/27/13=10 days for Hcap  Lethargy and confusion ? to infection or heart failure-sporadically and resolves spontaneously. -  ABG with mild hypercapnea, however, pH elevated.  Suspect chronic CO2 retention -  Ammonia 25  Chronic systolic heart failure with severe NICM EF 15-20% s/p pacemaker for bradycardia-Anasarca Recent admission for heart failure. Discharge weight of 126-lbs and currently 138lbs.    Has pronounced JVP and effusions on CXR, however, no overt pulmonary edema.  proBNP > 70K.   - See above discussion-hypotension limits diuresis - Held aldactone 12.5 mg given volume depletion  - Net positive 2.5-difficult situation with cardiorenal syndrome -CXR shows LL consolidation/collapse-Order incentive spirometry when awake  Blood leaking from urethra likely due to I/O cath trauma, -  Developed recurrent GU bleed after palcing foley for assesment renal/vol status - S./P. cystoscopy , placement of Foley over wire -Would keep in for comfort  Hypertension, at risk for hypotension -  D/c BB _amio started per Cardiology  T2DM, hypoglycemic A1c 9.2 last month -SSI sensitive re-started 2/21 given mild hyprglycemia [200's] -  D/c levemir -Liberalize diet patient is taking in less than 50% of all meals   Bilateral hip and knee pain, worse than baseline -  X-rays of hip and knee:  No fracture  Left arm basilic vein thrombus -  Wife declines a/c  b/c she feels it caused the large bruise of his right side  Deconditioning due to heart failure, recurrent infections, severe protein calorie malnutrition -  PT/OT assessments consistently recommned  SNF  Multiple pressure ulcers, feet, sacrum, back -  Appreciate wound care recs -  Air mattress  -  Booties -decubituc reviewed 2/23=     Severe protein calorie malnutrition -  Supplements, liberalized diet  Leukocytosis,improved since admission -  Trend WBC -  Continue abx, stop date 2/23  Normocytic anemia, recently received blood transfusion of 2 units from [place], occult stool positive.  Patient is not a good candidate for surgery or colonoscopy  -  Iron studies consistent with anemia of chronic disease but given occult positive stool, may be mixed picture -  Feraheme x 1  -  b12 1004 folate wnl, TSH 0.834 -  Occult stool positive -  S/p 2 units PRBC 09/20/13  Thrombocytopenia, acute phase reactant  Hypokalemia due to malnutrition, replete with oral potassium  Hypothermia due to severe heart failure  -  Warming blanket if needed  Thrush:  Continue oral fluconazole once daily, stop 2/26  Diet:  Dysphagia 1 Access:  PICC IVF:  yes Proph:  lovenox  Code Status: DNR Family Communication:   No family present at bedside today-LONG discussion>15 min with Grand-daughter in [place] for her to discuss care [phone].  Explained patient is actively dying with Adult FTT. Discussed that Palliative will do Goals of care Discussed Hospice eligible Discussed safest d/c plan likely In-patient hospice Disposition unknown Consultants:   Cardiology  Renal  Procedures:   CXR  CT anio  XR knee right  XR bilateral hips  Cystoscopy and Foley placement 2/23  Antibiotics:   Vancomycin 2/15-2/18  Zosyn x 1 2/15  Cefepime 2/15 >> 2/18  Levaquin 2/18>>>2/21  Fluconazole 2/16>>>  HPI/Subjective:  Weaker today Cannot understand what he is saying-speaking sof tDrank 1 bottle ensure No family present-long discussion as below Palliative formally consulted today-Dr. Phillips Odor will be seeing and will conference c family in am   Objective: Filed Vitals:    09/27/13 1452 09/27/13 2153 09/28/13 0530 09/28/13 1432  BP: 94/56 86/50 82/47  90/46  Pulse: 118 113 110 65  Temp: 97.6 F (36.4 C) 97.7 F (36.5 C) 97.5 F (36.4 C) 98.1 F (36.7 C)  TempSrc: Oral Axillary Oral Oral  Resp: 16 16 16 18   Height:      Weight:   63.866 kg (140 lb 12.8 oz)   SpO2: 100% 100% 100% 100%    Intake/Output Summary (Last 24 hours) at 09/28/13 1742 Last data filed at 09/28/13 1300  Gross per 24 hour  Intake    120 ml  Output    100 ml  Net     20 ml   Filed Weights   09/24/13 0508 09/27/13 0520 09/28/13 0530  Weight: 58.968 kg (130 lb) 62.869 kg (138 lb 9.6 oz) 63.866 kg (140 lb 12.8 oz)    Exam:   General:  Cachectic BM, No acute distress  HEENT:  NCAT, MMM, JVP to preauricular area   Cardiovascular:  Distant IRRR, nl S1, S2, + gallop, 1+ pulses, cool extremities  Respiratory:   Diminished bilateral BS, no wheezes or rhonchi, no increased WOB  Abdomen:   NABS, soft, NT/ND    Data Reviewed: Basic Metabolic Panel:  Recent Labs Lab 09/24/13 0540 09/25/13 0400 09/25/13 1258 09/25/13 2010 09/27/13 0615  NA 140 143 142 143 143  K 5.2 5.4* 5.3 5.3 5.1  CL 92* 96 96 96 97  CO2 36* 39* 37* 37* 35*  GLUCOSE 206* 290* 251* 238* 179*  BUN 78* 88* 90* 88* 104*  CREATININE 1.99* 2.39* 2.43* 2.63* 3.04*  CALCIUM 8.8 8.2* 8.1* 8.1* 8.3*  PHOS 5.7*  --   --  6.2*  --    Liver Function Tests:  Recent Labs Lab 09/23/13 0520 09/24/13 0540 09/25/13 2010 09/27/13 0615  AST 22  --   --  24  ALT 16  --   --  31  ALKPHOS 92  --   --  154*  BILITOT 1.2  --   --  0.7  PROT 5.8*  --   --  5.3*  ALBUMIN 2.3* 2.3* 2.2* 2.6*   No results found for this basename: LIPASE, AMYLASE,  in the last 168 hours No results found for this basename: AMMONIA,  in the last 168 hours CBC:  Recent Labs Lab 09/25/13 0400 09/25/13 2007 09/26/13 0600 09/27/13 0615 09/27/13 1035 09/28/13 0520  WBC 7.7 11.8* 14.3* 9.1  --  10.4  NEUTROABS 6.1  --   --   --    --   --   HGB 9.6* 9.5* 10.0* 9.0* 9.5* 9.9*  HCT 31.2* 30.2* 32.2* 30.0* 31.0* 31.7*  MCV 92.9 90.4 91.5 93.8  --  91.4  PLT 101* 84* 104* 76*  --  79*   Cardiac Enzymes:  Recent Labs Lab 09/26/13 0845 09/26/13 1745  TROPONINI 0.79* 1.01*   BNP (last 3 results)  Recent Labs  08/06/13 1121 08/12/13 1649 09/19/13 0100  PROBNP >70000.0* 61891.0* >70000.0*   CBG:  Recent Labs Lab 09/27/13 1714 09/27/13 2150 09/28/13 0720 09/28/13 1208 09/28/13 1726  GLUCAP 191* 240* 215* 235* 281*    Recent Results (from the past 240 hour(s))  CULTURE, BLOOD (ROUTINE X 2)     Status: None   Collection Time    09/19/13  5:05 AM      Result Value Ref Range Status   Specimen Description BLOOD RIGHT WRIST   Final   Special Requests BOTTLES DRAWN AEROBIC AND ANAEROBIC 5CC   Final   Culture  Setup Time     Final   Value: 09/19/2013 13:28     Performed at [facility]   Culture     Final   Value: NO GROWTH 5 DAYS     Performed at [facility]   Report Status 09/25/2013 FINAL   Final  CULTURE, BLOOD (ROUTINE X 2)     Status: None   Collection Time    09/19/13  5:18 AM      Result Value Ref Range Status   Specimen Description BLOOD BLOOD RIGHT FOREARM   Final   Special Requests BOTTLES DRAWN AEROBIC ONLY 4CC   Final   Culture  Setup Time     Final   Value: 09/19/2013 13:28     Performed at [facility]   Culture     Final   Value: NO GROWTH 5 DAYS     Performed at [facility]   Report Status 09/25/2013 FINAL   Final  MRSA PCR SCREENING     Status: None   Collection Time    09/19/13  6:37 AM      Result Value Ref Range Status   MRSA by PCR NEGATIVE  NEGATIVE Final   Comment:            The GeneXpert MRSA Assay (FDA  approved for NASAL specimens     only), is one component of a     comprehensive MRSA colonization     surveillance program. It is not     intended to diagnose MRSA     infection nor to guide or     monitor treatment for      MRSA infections.     Studies: No results found.  Scheduled Meds: . amiodarone  200 mg Oral BID  . feeding supplement (ENSURE COMPLETE)  237 mL Oral BID BM  . pantoprazole  40 mg Oral BID AC  . saccharomyces boulardii  250 mg Oral BID  . 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

## 2013-09-29 DIAGNOSIS — I5041 Acute combined systolic (congestive) and diastolic (congestive) heart failure: Secondary | ICD-10-CM

## 2013-09-29 DIAGNOSIS — Z515 Encounter for palliative care: Secondary | ICD-10-CM

## 2013-09-29 DIAGNOSIS — I509 Heart failure, unspecified: Secondary | ICD-10-CM

## 2013-09-29 LAB — BASIC METABOLIC PANEL
BUN: 123 mg/dL — ABNORMAL HIGH (ref 6–23)
CO2: 33 mEq/L — ABNORMAL HIGH (ref 19–32)
Calcium: 7.6 mg/dL — ABNORMAL LOW (ref 8.4–10.5)
Chloride: 96 mEq/L (ref 96–112)
Creatinine, Ser: 3.75 mg/dL — ABNORMAL HIGH (ref 0.50–1.35)
GFR calc Af Amer: 16 mL/min — ABNORMAL LOW (ref >90)
GFR, EST NON AFRICAN AMERICAN: 14 mL/min — AB (ref >90)
GLUCOSE: 336 mg/dL — AB (ref 70–99)
POTASSIUM: 6.2 meq/L — AB (ref 3.7–5.3)
Sodium: 141 mEq/L (ref 137–147)

## 2013-09-29 LAB — CBC
HEMATOCRIT: 30.2 % — AB (ref 39.0–52.0)
HEMOGLOBIN: 9.1 g/dL — AB (ref 13.0–17.0)
MCH: 27.8 pg (ref 26.0–34.0)
MCHC: 30.1 g/dL (ref 30.0–36.0)
MCV: 92.4 fL (ref 78.0–100.0)
Platelets: 70 10*3/uL — ABNORMAL LOW (ref 150–400)
RBC: 3.27 MIL/uL — ABNORMAL LOW (ref 4.22–5.81)
RDW: 20.9 % — AB (ref 11.5–15.5)
WBC: 9.2 10*3/uL (ref 4.0–10.5)

## 2013-09-29 LAB — GLUCOSE, CAPILLARY
GLUCOSE-CAPILLARY: 339 mg/dL — AB (ref 70–99)
GLUCOSE-CAPILLARY: 258 mg/dL — AB (ref 70–99)
Glucose-Capillary: 273 mg/dL — ABNORMAL HIGH (ref 70–99)
Glucose-Capillary: 312 mg/dL — ABNORMAL HIGH (ref 70–99)

## 2013-09-29 MED ORDER — SODIUM POLYSTYRENE SULFONATE 15 GM/60ML PO SUSP
15.0000 g | Freq: Once | ORAL | Status: AC
  Administered 2013-09-29: 15 g via ORAL
  Filled 2013-09-29: qty 60

## 2013-09-29 MED ORDER — INSULIN ASPART 100 UNIT/ML ~~LOC~~ SOLN
0.0000 [IU] | Freq: Three times a day (TID) | SUBCUTANEOUS | Status: DC
Start: 2013-09-29 — End: 2013-10-07
  Administered 2013-09-29: 7 [IU] via SUBCUTANEOUS
  Administered 2013-09-30: 3 [IU] via SUBCUTANEOUS
  Administered 2013-09-30: 2 [IU] via SUBCUTANEOUS
  Administered 2013-09-30: 3 [IU] via SUBCUTANEOUS
  Administered 2013-10-06 (×2): 1 [IU] via SUBCUTANEOUS

## 2013-09-29 NOTE — Progress Notes (Addendum)
A call to Mrs. Ditomaso was made to encourage her to come visit pt with a NA to care for her needs.This is related to pt not being able to care for herself in ADL's/she will need someone with her to care for her needs. [facility], Social Worker J. Fulton Mole [phone], with pt at present time, states she will assist pt with understanding that someone needs to be with her when she comes to visit pt.

## 2013-09-29 NOTE — Progress Notes (Signed)
Spoke with pt's wife and pt's daughter Noland Fordyce who lives in [place]. Pt's daughter was concerned because her mother called her to say that the MD said that the pt only had a few hours to live. I spoke with the pt's RN, Darl Pikes, and clarified that the MD said that the pt may have a few hours, to weeks, to months. Pt's wife misinterpreted and thought that her husband was dying at this time. Pt's wife and daughter were reassured by the RN's assessment that the pt is no different today than he was yesterday and he is not showing any signs of actively dying at this time. Pt's wife was encouraged to visit, but it was made clear to her and her daughter that the pt must have someone with her while she is visiting. Pt's wife said that she had an NT coming at about 6pm. Pt's wife was told that she could visit as long as the NT came with her and stayed and took her back home. It was made clear to the pt's wife and daughter  that our main concern if for the safety of the pt's wife. They both stated they understood.   Arta Bruce [facility] 09/29/2013 5:49 PM

## 2013-09-29 NOTE — Progress Notes (Addendum)
PROGRESS NOTE  Frederick [name]:[id number] DOB: 04/16/1932 DOA: 09/17/2013 PCP: Arvella NighADCOCK, JIMMIE, MD  Assessment / Plan: Goals of care - patient is with multi organ failure as below, oliguric, hyperkalemic and with minimal PO intake. Palliative care has been consulted; discussed case with his wife who is not fully realizing that he is terminal.  - will most likely benefit from inpatient hospice.  Oliguric/ATN-potentially postobstructive component  - Potentially secondary to IV contrast secondary to CT angiogram  - FENa: Prerenal initially  - Suspect this is new baseline 2/2 to need for diuresis  - Nephrology consulted 2/21 and have signed off-recommended IV albumin/IV Lasix one-time dose-unfortunately cannot aggressively diurese given hypotension-Diuresis limited by hypotension-by mouth Lasix 40 mg given 2/22  - IV saline started for fluid repletion on 2/21 but was DC as developing anasarca  - Creatinine continues to rise - Renal ultrasound 2/20 = medical renal disease no evidence hydronephrosis  - not a dialysis candidate.  - more hyperkalemic today, BUN/Cr rising. Very poor prognosis. Kayexalate  Paroxysmal SVT, heart rate 160  - Likely secondary to beta blocker withdrawal  - Troponin secondary to demand ischemia  - EKG showed T wave inversions, however repeat EKG showed resolution 2/22  - Carotid sinus massage 2/24  - see cards note  Acute hypoxic respiratory failure due to HCAP (healthcare-associated pneumonia) vs. atelectasis. CTA negative for PE.  - BCx NGTD  - Flu neg  - S. pneumo neg  - Legionella neg  - Narrow IV vancomycin and IV cefepime 2/18 to Levaquin PO and reassess WBC in am and fever curve  - Antibiotics stop date 09/27/13=10 days for Hcap  Lethargy and confusion ? to infection or heart failure-sporadically and resolves spontaneously.  - ABG with mild hypercapnea, however, pH elevated. Suspect chronic CO2 retention  - Ammonia 25  Chronic systolic heart failure with  severe NICM EF 15-20% s/p pacemaker for bradycardia-Anasarca  Recent admission for heart failure. Discharge weight of 126-lbs and currently 138lbs. Has pronounced JVP and effusions on CXR, however, no overt pulmonary edema. proBNP > 70K.  - See above discussion-hypotension limits diuresis  - Held aldactone 12.5 mg given volume depletion  - Net positive 2.5-difficult situation with cardiorenal syndrome  - CXR shows LL consolidation/collapse-Order incentive spirometry when awake  Blood leaking from urethra likely due to I/O cath trauma,  - Developed recurrent GU bleed after palcing foley for assesment renal/vol status  - S./P. cystoscopy, placement of Foley over wire  - Would keep in for comfort  Hypertension, at risk for hypotension  - D/c BB  _amio started per Cardiology  T2DM, hypoglycemic A1c 9.2 last month  - SSI sensitive re-started 2/21 given mild hyprglycemia [200's]  - D/c levemir  - Liberalize diet patient is taking in less than 50% of all meals  Bilateral hip and knee pain, worse than baseline  - X-rays of hip and knee: No fracture  Left arm basilic vein thrombus  - Wife declines a/c b/c she feels it caused the large bruise of his right side  Deconditioning due to heart failure, recurrent infections, severe protein calorie malnutrition  - PT/OT assessments consistently recommned SNF  Multiple pressure ulcers, feet, sacrum, back  - Appreciate wound care recs  - Air mattress  - Booties  - decubituc reviewed 2/23    Severe protein calorie malnutrition  - Supplements, liberalized diet  Leukocytosis,improved since admission  - Trend WBC  - Continue abx, stop date 2/23  Normocytic anemia, recently received blood transfusion  of 2 units from [place], occult stool positive. Patient is not a good candidate for surgery or colonoscopy  - Iron studies consistent with anemia of chronic disease but given occult positive stool, may be mixed picture  - Feraheme x 1  - b12 1004 folate wnl, TSH  0.834  - Occult stool positive  - S/p 2 units PRBC 09/20/13  Thrombocytopenia Hypokalemia due to malnutrition, replete with oral potassium  Hypothermia due to severe heart failure  - Warming blanket if needed  Thrush: Continue oral fluconazole once daily, stop 2/26  Diet: Dysphagia  Fluids: NS DVT Prophylaxis: SCD  Code Status: DNR Family Communication: wife over phone  Disposition Plan: inpatient  Consultants:  Palliative  Nephrology  Cardiology   Procedures:  none   Antibiotics Vancomycin 2/15-2/18 Zosyn x 1 2/15 Cefepime 2/15 >> 2/18  Levaquin 2/18>>>2/21  Fluconazole 2/16>>>  HPI/Subjective: - opens eyes, confused  Objective: Filed Vitals:   09/28/13 1432 09/28/13 2150 09/29/13 0037 09/29/13 0515  BP: 90/46 90/41 89/44  82/40  Pulse: 65 69 65 65  Temp: 98.1 F (36.7 C) 97.3 F (36.3 C)  97.2 F (36.2 C)  TempSrc: Oral Oral  Oral  Resp: 18 16  15   Height:      Weight:    63.864 kg (140 lb 12.7 oz)  SpO2: 100% 95%  100%    Intake/Output Summary (Last 24 hours) at 09/29/13 1333 Last data filed at 09/29/13 0900  Gross per 24 hour  Intake    420 ml  Output    150 ml  Net    270 ml   Filed Weights   09/27/13 0520 09/28/13 0530 09/29/13 0515  Weight: 62.869 kg (138 lb 9.6 oz) 63.866 kg (140 lb 12.8 oz) 63.864 kg (140 lb 12.7 oz)    Exam:   General:  NAD, cachectic   Cardiovascular: regular rate and rhythm, without MRG  Respiratory: decreased breath sounds, no wheezing  Abdomen: soft, positive bowel sounds  MSK: 1+ peripheral edema  Data Reviewed: Basic Metabolic Panel:  Recent Labs Lab 09/24/13 0540 09/25/13 0400 09/25/13 1258 09/25/13 2010 09/27/13 0615 09/29/13 1129  NA 140 143 142 143 143 141  K 5.2 5.4* 5.3 5.3 5.1 6.2*  CL 92* 96 96 96 97 96  CO2 36* 39* 37* 37* 35* 33*  GLUCOSE 206* 290* 251* 238* 179* 336*  BUN 78* 88* 90* 88* 104* 123*  CREATININE 1.99* 2.39* 2.43* 2.63* 3.04* 3.75*  CALCIUM 8.8 8.2* 8.1* 8.1* 8.3*  7.6*  PHOS 5.7*  --   --  6.2*  --   --    Liver Function Tests:  Recent Labs Lab 09/23/13 0520 09/24/13 0540 09/25/13 2010 09/27/13 0615  AST 22  --   --  24  ALT 16  --   --  31  ALKPHOS 92  --   --  154*  BILITOT 1.2  --   --  0.7  PROT 5.8*  --   --  5.3*  ALBUMIN 2.3* 2.3* 2.2* 2.6*   CBC:  Recent Labs Lab 09/25/13 0400 09/25/13 2007 09/26/13 0600 09/27/13 0615 09/27/13 1035 09/28/13 0520 09/29/13 0405  WBC 7.7 11.8* 14.3* 9.1  --  10.4 9.2  NEUTROABS 6.1  --   --   --   --   --   --   HGB 9.6* 9.5* 10.0* 9.0* 9.5* 9.9* 9.1*  HCT 31.2* 30.2* 32.2* 30.0* 31.0* 31.7* 30.2*  MCV 92.9 90.4 91.5 93.8  --  91.4  92.4  PLT 101* 84* 104* 76*  --  79* 70*   Cardiac Enzymes:  Recent Labs Lab 09/26/13 0845 09/26/13 1745  TROPONINI 0.79* 1.01*   BNP (last 3 results)  Recent Labs  08/06/13 1121 08/12/13 1649 09/19/13 0100  PROBNP >70000.0* 61891.0* >70000.0*   CBG:  Recent Labs Lab 09/28/13 1208 09/28/13 1726 09/28/13 2131 09/29/13 0738 09/29/13 1151  GLUCAP 235* 281* 246* 273* 312*    No results found for this or any previous visit (from the past 240 hour(s)).   Studies: No results found.  Scheduled Meds: . amiodarone  200 mg Oral BID  . feeding supplement (ENSURE COMPLETE)  237 mL Oral BID BM  . pantoprazole  40 mg Oral BID AC  . saccharomyces boulardii  250 mg Oral BID  . sodium chloride  10-40 mL Intracatheter Q12H  . 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 Any transcriptional errors are unintentional.   Pamella Pert, MD [facility] Pager [phone]. If 7 PM - 7 AM, please contact night-coverage at [email or web address], password [facility] 09/29/2013, 1:33 PM  LOS: 11 days

## 2013-09-29 NOTE — Progress Notes (Addendum)
     Subjective:  No further SVT - carotid massage termination. Mildly elevated troponin in the setting of SVT No complaints currently, no CP, no SOB. Somnolent.   Prior notes reviewed.   Objective:  Vital Signs in the last 24 hours: Temp:  [97.2 F (36.2 C)-98.1 F (36.7 C)] 97.2 F (36.2 C) (02/25 0515) Pulse Rate:  [65-69] 65 (02/25 0515) Resp:  [15-18] 15 (02/25 0515) BP: (82-90)/(40-46) 82/40 mmHg (02/25 0515) SpO2:  [95 %-100 %] 100 % (02/25 0515) Weight:  [140 lb 12.7 oz (63.864 kg)] 140 lb 12.7 oz (63.864 kg) (02/25 0515)  Intake/Output from previous day: 02/24 0701 - 02/25 0700 In: 420 [P.O.:420] Out: 150 [Urine:150]   Physical Exam: General: Elderly, frail, asleep Head:  Normocephalic and atraumatic. Lungs: Clear to auscultation and percussion. Heart: Normal S1 and S2.  Soft systolic murmur, no rubs or gallops.  Abdomen: soft, non-tender, positive bowel sounds. Extremities: No clubbing or cyanosis. No edema. Neurologic: Alert.    Lab Results:  Recent Labs  09/28/13 0520 09/29/13 0405  WBC 10.4 9.2  HGB 9.9* 9.1*  PLT 79* 70*    Recent Labs  09/27/13 0615  NA 143  K 5.1  CL 97  CO2 35*  GLUCOSE 179*  BUN 104*  CREATININE 3.04*    Recent Labs  09/26/13 1745  TROPONINI 1.01*   Hepatic Function Panel  Recent Labs  09/27/13 0615  PROT 5.3*  ALBUMIN 2.6*  AST 24  ALT 31  ALKPHOS 154*  BILITOT 0.7   Imaging: No results found. Personally viewed.   Telemetry: No further SVT, a-paced Personally viewed.   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 EF of 20-25%   HTN (hypertension)   COPD (chronic obstructive pulmonary disease)   ARF (acute renal failure)   Protein-calorie malnutrition, severe   Pleural effusion   Prolonged immobilization  Decubitus ulcer   Physical deconditioning   Anemia of chronic disease   Anemia, iron deficiency  80 year old male with nonischemic cardiomyopathy, cardiac catheterization on 04/19/13 demonstrating ejection fraction of 15-20 % with no significant coronary artery disease, pacemaker following severe bradycardia, several admissions, 3 times in the past 6 weeks, recent pneumonia, failure to thrive, sacral decubiti, cardiorenal syndrome/AKI, relative hypotension with ongoing heart failure.   1) SVT - much better with coreg. Continue. Try IV metop if needed in the situation of return of SVT. Dr. Graciela Husbands started amiodarone 200mg  BID low dose for suppression.  2) Elevated troponin - demand in the setting of SVT. Prior cath with no CAD.   3) Cardiomyopathy/chronic systolic HF - appears well compensated. No SOB, no orthopnea. Difficult with worsening renal failure.  4) Renal failure/AKI - ?cardiorenal, low output, contrast? Nephology following. Avoiding ACE-I.   5) End of life issues -  palliative care team   Frederick [name] 09/29/2013, 9:09 AM

## 2013-09-29 NOTE — Progress Notes (Signed)
Dr. Elvera Lennox made aware of Frederick [name]. Pacer not sensing and hitting in middle of qrs.

## 2013-09-30 DIAGNOSIS — Z66 Do not resuscitate: Secondary | ICD-10-CM | POA: Diagnosis present

## 2013-09-30 LAB — CBC
HEMATOCRIT: 30.4 % — AB (ref 39.0–52.0)
HEMOGLOBIN: 9.2 g/dL — AB (ref 13.0–17.0)
MCH: 28 pg (ref 26.0–34.0)
MCHC: 30.3 g/dL (ref 30.0–36.0)
MCV: 92.7 fL (ref 78.0–100.0)
PLATELETS: 80 10*3/uL — AB (ref 150–400)
RBC: 3.28 MIL/uL — ABNORMAL LOW (ref 4.22–5.81)
RDW: 20.9 % — ABNORMAL HIGH (ref 11.5–15.5)
WBC: 9.2 10*3/uL (ref 4.0–10.5)

## 2013-09-30 LAB — GLUCOSE, CAPILLARY
GLUCOSE-CAPILLARY: 196 mg/dL — AB (ref 70–99)
GLUCOSE-CAPILLARY: 220 mg/dL — AB (ref 70–99)
GLUCOSE-CAPILLARY: 128 mg/dL — AB (ref 70–99)
Glucose-Capillary: 227 mg/dL — ABNORMAL HIGH (ref 70–99)

## 2013-09-30 LAB — BASIC METABOLIC PANEL
BUN: 133 mg/dL — ABNORMAL HIGH (ref 6–23)
CHLORIDE: 96 meq/L (ref 96–112)
CO2: 33 mEq/L — ABNORMAL HIGH (ref 19–32)
Calcium: 7.2 mg/dL — ABNORMAL LOW (ref 8.4–10.5)
Creatinine, Ser: 3.78 mg/dL — ABNORMAL HIGH (ref 0.50–1.35)
GFR calc Af Amer: 16 mL/min — ABNORMAL LOW (ref >90)
GFR calc non Af Amer: 14 mL/min — ABNORMAL LOW (ref >90)
Glucose, Bld: 221 mg/dL — ABNORMAL HIGH (ref 70–99)
Potassium: 5.5 mEq/L — ABNORMAL HIGH (ref 3.7–5.3)
Sodium: 142 mEq/L (ref 137–147)

## 2013-09-30 MED ORDER — SODIUM POLYSTYRENE SULFONATE 15 GM/60ML PO SUSP
15.0000 g | Freq: Once | ORAL | Status: AC
  Administered 2013-09-30: 15 g via ORAL
  Filled 2013-09-30: qty 60

## 2013-09-30 NOTE — Progress Notes (Signed)
PROGRESS NOTE  Frederick [name] [facility]:[id number] DOB: 10-31-1931 DOA: 10/02/2013 PCP: Arvella Nigh, MD  Assessment / Plan: Goals of care - patient is with multi organ failure as below, oliguric, hyperkalemic and with minimal PO intake. Palliative care has been consulted; discussed case with his wife who is not fully realizing that he is terminal.  - will most likely benefit from inpatient hospice.  - discussed again with wife today; patient stated that he does not wish hospice and wants home. He has underlying confusion and is not oriented to place and time but can articulate his wishes.  Oliguric/ATN-potentially postobstructive component  - Potentially secondary to IV contrast secondary to CT angiogram  - FENa: Prerenal initially  - Suspect this is new baseline 2/2 to need for diuresis  - Nephrology consulted 2/21 and have signed off-recommended IV albumin/IV Lasix one-time dose-unfortunately cannot aggressively diurese given hypotension-Diuresis limited by hypotension-by mouth Lasix 40 mg given 2/22. BP improving slightly on 2/26, if continues to stay >100 systolic, will repeat Lasix - IV saline started for fluid repletion on 2/21 but was DC as developing anasarca  - Creatinine continues to rise, BUN elevated and rising.  - Renal ultrasound 2/20 = medical renal disease no evidence hydronephrosis  - not a dialysis candidate.  - hyperkalemia improved today.  Paroxysmal SVT, heart rate 160  - Likely secondary to beta blocker withdrawal  - Troponin secondary to demand ischemia  - EKG showed T wave inversions, however repeat EKG showed resolution 2/22  - Carotid sinus massage 2/24  - see cards note, appreciated input.  Acute hypoxic respiratory failure due to HCAP (healthcare-associated pneumonia) vs. atelectasis. CTA negative for PE.  - BCx NGTD  - Flu neg  - S. pneumo neg  - Legionella neg  - Antibiotics stop date 09/27/13=10 days for Hcap  Lethargy and confusion ? to infection or heart  failure-sporadically and resolves spontaneously.  - ABG with mild hypercapnea, however, pH elevated. Suspect chronic CO2 retention  - Ammonia 25  Chronic systolic heart failure with severe NICM EF 15-20% s/p pacemaker for bradycardia-Anasarca  Recent admission for heart failure. Discharge weight of 126-lbs and currently 138lbs. Has pronounced JVP and effusions on CXR, however, no overt pulmonary edema. proBNP > 70K.  - See above discussion-hypotension limits diuresis  - Held aldactone 12.5 mg given volume depletion  - Net positive 2.5-difficult situation with cardiorenal syndrome  - CXR shows LL consolidation/collapse-Order incentive spirometry when awake  Blood leaking from urethra likely due to I/O cath trauma,  - Developed recurrent GU bleed after palcing foley for assesment renal/vol status  - S./P. cystoscopy, placement of Foley over wire  - Would keep in for comfort  Hypertension, at risk for hypotension  - D/c BB  _amio started per Cardiology  T2DM, hypoglycemic A1c 9.2 last month  - SSI sensitive re-started 2/21 given mild hyprglycemia [200's]  - D/c levemir  - Liberalize diet patient is taking in less than 50% of all meals  Bilateral hip and knee pain, worse than baseline  - X-rays of hip and knee: No fracture  Left arm basilic vein thrombus  - Wife declines a/c b/c she feels it caused the large bruise of his right side  Deconditioning due to heart failure, recurrent infections, severe protein calorie malnutrition  - PT/OT assessments consistently recommned SNF  Multiple pressure ulcers, feet, sacrum, back  - Appreciate wound care recs  - Air mattress  - Booties  - decubituc reviewed 2/23    Severe protein  calorie malnutrition  - Supplements, liberalized diet  Leukocytosis,improved since admission  - Trend WBC  -  abx stop date 2/23  Normocytic anemia, [facility] [place] blood [facility], occult stool positive. Patient is not a good candidate for  surgery or colonoscopy  - Iron studies consistent with anemia of chronic disease but given occult positive stool, may be mixed picture  - Feraheme x 1  - b12 1004 folate wnl, TSH 0.834  - Occult stool positive  - S/p 2 units PRBC 09/20/13, stable since Thrombocytopenia Hypokalemia, now hyperkalemic due to renal failure  Hypothermia due to severe heart failure - Warming blanket if needed  Thrush: Continue oral fluconazole once daily, stop 2/26  Diet: Dysphagia  Fluids: NS DVT Prophylaxis: SCD  Code Status: DNR Family Communication: wife in the room Disposition Plan: inpatient  Consultants:  Palliative  Nephrology  Cardiology   Procedures:  none   Antibiotics Vancomycin 2/15-2/18 Zosyn x 1 2/15 Cefepime 2/15 >> 2/18  Levaquin 2/18>>>2/21  Fluconazole 2/16>>>  HPI/Subjective: - opens eyes, confused  Objective: Filed Vitals:   09/29/13 0515 09/29/13 1426 09/29/13 2200 09/30/13 0700  BP: 82/40 90/45 107/43 93/40  Pulse: 65 67 56 60  Temp: 97.2 F (36.2 C) 97.9 F (36.6 C) 97.5 F (36.4 C) 97.4 F (36.3 C)  TempSrc: Oral Oral Axillary Oral  Resp: 15 14 16 16   Height:      Weight: 63.864 kg (140 lb 12.7 oz)   66.1 kg (145 lb 11.6 oz)  SpO2: 100% 81% 100% 100%    Intake/Output Summary (Last 24 hours) at 09/30/13 1401 Last data filed at 09/30/13 0700  Gross per 24 hour  Intake    280 ml  Output    175 ml  Net    105 ml   Filed Weights   09/28/13 0530 09/29/13 0515 09/30/13 0700  Weight: 63.866 kg (140 lb 12.8 oz) 63.864 kg (140 lb 12.7 oz) 66.1 kg (145 lb 11.6 oz)    Exam:   General:  NAD, cachectic   Cardiovascular: regular rate and rhythm, without MRG  Respiratory: decreased breath sounds, no wheezing  Abdomen: soft, positive bowel sounds  MSK: 1+ peripheral edema  Data Reviewed: Basic Metabolic Panel:  Recent Labs Lab 09/24/13 0540  09/25/13 1258 09/25/13 2010 09/27/13 0615 09/29/13 1129 09/30/13 0435  NA 140  < > 142 143 143 141  142  K 5.2  < > 5.3 5.3 5.1 6.2* 5.5*  CL 92*  < > 96 96 97 96 96  CO2 36*  < > 37* 37* 35* 33* 33*  GLUCOSE 206*  < > 251* 238* 179* 336* 221*  BUN 78*  < > 90* 88* 104* 123* 133*  CREATININE 1.99*  < > 2.43* 2.63* 3.04* 3.75* 3.78*  CALCIUM 8.8  < > 8.1* 8.1* 8.3* 7.6* 7.2*  PHOS 5.7*  --   --  6.2*  --   --   --   < > = values in this interval not displayed. Liver Function Tests:  Recent Labs Lab 09/24/13 0540 09/25/13 2010 09/27/13 0615  AST  --   --  24  ALT  --   --  31  ALKPHOS  --   --  154*  BILITOT  --   --  0.7  PROT  --   --  5.3*  ALBUMIN 2.3* 2.2* 2.6*   CBC:  Recent Labs Lab 09/25/13 0400  09/26/13 0600 09/27/13 0615 09/27/13 1035 09/28/13  3244 09/29/13 0405 09/30/13 0435  WBC 7.7  < > 14.3* 9.1  --  10.4 9.2 9.2  NEUTROABS 6.1  --   --   --   --   --   --   --   HGB 9.6*  < > 10.0* 9.0* 9.5* 9.9* 9.1* 9.2*  HCT 31.2*  < > 32.2* 30.0* 31.0* 31.7* 30.2* 30.4*  MCV 92.9  < > 91.5 93.8  --  91.4 92.4 92.7  PLT 101*  < > 104* 76*  --  79* 70* 80*  < > = values in this interval not displayed. Cardiac Enzymes:  Recent Labs Lab 09/26/13 0845 09/26/13 1745  TROPONINI 0.79* 1.01*   BNP (last 3 results)  Recent Labs  08/06/13 1121 08/12/13 1649 09/19/13 0100  PROBNP >70000.0* 61891.0* >70000.0*   CBG:  Recent Labs Lab 09/29/13 1151 09/29/13 1726 09/29/13 2215 09/30/13 0754 09/30/13 1133  GLUCAP 312* 339* 258* 196* 220*   Studies: No results found.  Scheduled Meds: . amiodarone  200 mg Oral BID  . feeding supplement (ENSURE COMPLETE)  237 mL Oral BID BM  . insulin aspart  0-9 Units Subcutaneous TID WC  . pantoprazole  40 mg Oral BID AC  . saccharomyces boulardii  250 mg Oral BID  . 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 Any transcriptional errors are unintentional.   Pamella Pert, MD [facility] Pager [phone]. If 7 PM - 7 AM, please contact night-coverage at [email or web address], password [facility] 09/30/2013, 2:01 PM  LOS: 12 days

## 2013-09-30 NOTE — Consult Note (Signed)
Palliative Medicine Team at [facility]  Date: 09/30/2013   Patient Name: Frederick [name]  DOB: 07-Jan-1932  MRN: [id number]  Age / Sex: 80 y.o., male   PCP: Arvella Nigh, MD Referring Physician: Leatha Gilding, MD  HPI/Reason for Consultation: 80 year old gentleman with chronic systolic heart failure with severe nonischemic cardiomyopathy with an EF of 15-20% admitted with acute hypoxic respiratory failure secondary to healthcare associated pneumonia. He is extremely cachectic and has failure to thrive. He has multiple decubitus ulcers that have been worsening since his prior admission. He has progressed to cardiorenal failure with a rapidly worsening BUN/creatinine in the setting of continued attempts at diuresis-he has been hypotensive and his BNP remains greater than 70,000. He has a chronic normocytic anemia that has recently required blood transfusions-he has been managed at the [facility] and is not deemed to be a good candidate for surgery or colonoscopy. During this admission he has had several episodes of paroxysmal SVT with heart rates in the 160s he has a pacemaker for prior bradycardia. The attending service has discussed the serious nature of his illness and discuss goals of care with his wife who has been his primary caregiver but had confronted multiple serious barriers to achieving the best possible care for him given how serious his illnesses is currently and that he will likely not survive his current condition.  Participants in Discussion: On my assessment the patient is not oriented to place or time he can whisper his name, he is unable to provide specifics about his health problems to me-there was no family at the bedside. I spoke with his wife Fabion Gatson by phone- I explained to her his worsening kidney failure, and he is not a candidate for dialysis, he is severely malnourished and facing end-of-life. Ms. Head does not accept the recommendation for hospice care-significant  educational barriers. There is also a very difficult social situation in terms of his living situation I am not certain of the details but adult protective services is involved. This gentleman also has 4 biological children who have not been included in his goals of care or in decision making regarding his care plan- the children listed on the contact list are his wife's children. His wife did provide me with four phone numbers for each of his biological children but none of the numbers were to identifiable voice mail and I have been unsuccessful in reaching him.  Goals/Summary of Discussion: Discussed his condition with his wife, she feels uncomfortable with the current level of care he is receiving she does not feel that he is dying- she also says that she needs to get him to "sign some papers" and that she isnt ready for this- she knows he is in renal failure-I told her he was dying with a prognosis of hours to days if his renal function continues to deteriorate- there is the possibility he could stabilize but he is taking in very little nutrition.  1. Code Status:  DNR  2. Scope of Treatment:  Treat Reversible conditions  Continue high level of monitoring  Wife and Patient refuse "Hospice"- he wants to "go home"  Comfort feeding as tolerated  Needs wound care  3. Assessment/Plan:  Primary Diagnoses  1. Renal Failure-not a candidate for HD, malnourished, frail, bedbound--BUN rising 130's suspect he will become uremic soon-he is also becoming more congested. 2. CHF EF15%- NICM- worsening edema-not on IV fluids 3. Paroxysmal SVT-carotid massage-he has pacemaker-d/c tele cardiology has signed off-maintain cardiac meds 4. Severe  decubitus ulcers=- requires daily wound care-he is too malnourished and immobile to heal these wounds. 5. Difficult home situation APS is involved-neighbor called- wife unable to care for him at home.  Recomendations: 1. Continue to work with family-he is not sfe  or stable enough for discharge 2. I have placed calls to his children but unable to get them on the phone. 3. Comfort feeding 4. Monitor labs and treat reversible disease 5, Close attention to symptoms- I told wife with or without hospice we would not let him suffer if we had nothing else to offer from an aggressive medical treatment standpoint.  Prognosis: hours-days-unstable  PPS 20%   Active Symptoms 1. Congestion/Dyspnea 2. Dysphagia 3. Confusion 4. Immobility 5. Pain due to worsening decubitus ulcers  4. Palliative Prophylaxis:   Wife refuses pain or comfort medications 5. Psychosocial Spiritual Asssessment/Interventions:  Patient and Family Adjustment to Illness/Prognosis: very poor, wife tells me she is a Geophysicist/field seismologist and knows from her experience that Hospice kills people.  Spiritual Concerns or Needs: unexpressed  6. Disposition: TBD, home appears to be unsafe-would need APS to weigh in on safety of environment and it would need to be with hospice-if he does not was a hospice facility they will need to consider SNF placement.  ROS:  Unable to provide. Social History:   reports that he quit smoking about 41 years ago. His smoking use included Cigarettes. He smoked 0.00 packs per day. He has quit using smokeless tobacco. His smokeless tobacco use included Snuff and Chew. He reports that he does not drink alcohol or use illicit drugs. Living Situation: previously was home with his wife Occupation: unknown, has [employer] benefits  Family History: Family History  Problem Relation Age of Onset  . Heart failure Mother     Active Medications:  Outpatient medications: Prescriptions prior to admission  Medication Sig Dispense Refill  . aspirin EC 81 MG EC tablet Take 1 tablet (81 mg total) by mouth daily.      . carvedilol (COREG) 3.125 MG tablet Take 1 tablet (3.125 mg total) by mouth 2 (two) times daily with a meal.  60 tablet  5  . furosemide (LASIX) 40 MG tablet  Take 1 tablet (40 mg total) by mouth 2 (two) times daily.  60 tablet  4  . glipiZIDE (GLUCOTROL) 5 MG tablet Take 0.5 tablets (2.5 mg total) by mouth daily before breakfast.  30 tablet  2  . lisinopril (PRINIVIL,ZESTRIL) 20 MG tablet Take 20 mg by mouth daily.       Marland Kitchen oxyCODONE (OXY IR/ROXICODONE) 5 MG immediate release tablet Take 1 tablet (5 mg total) by mouth every 4 (four) hours as needed for moderate pain.  30 tablet  0    Current medications: Infusions:    Scheduled Medications: . amiodarone  200 mg Oral BID  . feeding supplement (ENSURE COMPLETE)  237 mL Oral BID BM  . insulin aspart  0-9 Units Subcutaneous TID WC  . pantoprazole  40 mg Oral BID AC  . saccharomyces boulardii  250 mg Oral BID  . sodium chloride  10-40 mL Intracatheter Q12H    PRN Medications: acetaminophen, bisacodyl, docusate sodium, oxyCODONE-acetaminophen, sodium chloride, traMADol   Vital Signs: BP 125/48  Pulse 59  Temp(Src) 97.4 F (36.3 C) (Axillary)  Resp 16  Ht 5\' 7"  (1.702 m)  Wt 66.1 kg (145 lb 11.6 oz)  BMI 22.82 kg/m2  SpO2 100%   Physical Exam:  Frail, very ill and malnourished appearing Worsening airway  congestion Sleeps most of the day-perios where he can be more talkative. HR is regular but tachycardic Drg on his heels-did not examine his back  Labs:  Basic or Comprehensive Metabolic Panel:    Component Value Date/Time   NA 142 09/30/2013 0435   K 5.5* 09/30/2013 0435   CL 96 09/30/2013 0435   CO2 33* 09/30/2013 0435   BUN 133* 09/30/2013 0435   CREATININE 3.78* 09/30/2013 0435   GLUCOSE 221* 09/30/2013 0435   CALCIUM 7.2* 09/30/2013 0435   AST 24 09/27/2013 0615   ALT 31 09/27/2013 0615   ALKPHOS 154* 09/27/2013 0615   BILITOT 0.7 09/27/2013 0615   PROT 5.3* 09/27/2013 0615   ALBUMIN 2.6* 09/27/2013 0615     CBC:    Component Value Date/Time   WBC 9.2 09/30/2013 0435   HGB 9.2* 09/30/2013 0435   HCT 30.4* 09/30/2013 0435   PLT 80* 09/30/2013 0435   MCV 92.7 09/30/2013 0435    NEUTROABS 6.1 09/25/2013 0400   LYMPHSABS 0.9 09/25/2013 0400   MONOABS 0.7 09/25/2013 0400   EOSABS 0.0 09/25/2013 0400   BASOSABS 0.0 09/25/2013 0400     BNP (last 3 results)  Recent Labs  08/06/13 1121 08/12/13 1649 09/19/13 0100  PROBNP >70000.0* 61891.0* >70000.0*    CBG (last 3)   Recent Labs  09/29/13 2215 09/30/13 0754 09/30/13 1133  GLUCAP 258* 196* 220*    Imaging:  No results found.  Other Data:  (2D echo, EKG...)   Time: 70 minutes Greater than 50%  of this time was spent counseling and coordinating care related to the above assessment and plan.  Signed by: Edsel PetrinElizabeth L Golding, DO  09/30/2013, 3:49 PM  Please contact Palliative Medicine Team phone at [phone] for questions and concerns.

## 2013-09-30 NOTE — Progress Notes (Addendum)
      Subjective:  No further SVT - carotid massage termination last episode. Mildly elevated troponin in the setting of SVT Somnolent.   Prior notes reviewed.   Objective:  Vital Signs in the last 24 hours: Temp:  [97.4 F (36.3 C)-97.9 F (36.6 C)] 97.4 F (36.3 C) (02/26 0700) Pulse Rate:  [56-67] 60 (02/26 0700) Resp:  [14-16] 16 (02/26 0700) BP: (90-107)/(40-45) 93/40 mmHg (02/26 0700) SpO2:  [81 %-100 %] 100 % (02/26 0700) Weight:  [145 lb 11.6 oz (66.1 kg)] 145 lb 11.6 oz (66.1 kg) (02/26 0700)  Intake/Output from previous day: 02/25 0701 - 02/26 0700 In: 520 [P.O.:300; I.V.:220] Out: 180 [Urine:180]   Physical Exam: General: Elderly, frail, asleep, thin, ill appearing.  Head:  Normocephalic and atraumatic. Lungs: Clear to auscultation and percussion. Heart: Normal S1 and S2.  Soft systolic murmur, no rubs or gallops.  Abdomen: soft, non-tender, positive bowel sounds. Extremities: No clubbing or cyanosis. No edema. Neurologic: Alert.    Lab Results:  Recent Labs  09/29/13 0405 09/30/13 0435  WBC 9.2 9.2  HGB 9.1* 9.2*  PLT 70* 80*    Recent Labs  09/29/13 1129 09/30/13 0435  NA 141 142  K 6.2* 5.5*  CL 96 96  CO2 33* 33*  GLUCOSE 336* 221*  BUN 123* 133*  CREATININE 3.75* 3.78*     Telemetry: No further SVT, a-paced Personally viewed.   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 EF of 20-25%   HTN (hypertension)   COPD (chronic obstructive pulmonary disease)   ARF (acute renal failure)   Protein-calorie malnutrition, severe   Pleural effusion   Prolonged immobilization   Decubitus ulcer   Physical deconditioning   Anemia of chronic disease   Anemia, iron deficiency  80 year old male with nonischemic cardiomyopathy, cardiac catheterization on 04/19/13  demonstrating ejection fraction of 15-20 % with no significant coronary artery disease, pacemaker following severe bradycardia, several admissions, 3 times in the past 6 weeks, recent pneumonia, failure to thrive, sacral decubiti, cardiorenal syndrome/AKI, relative hypotension with ongoing heart failure.   1) SVT - much better with coreg. Continue. Try IV metop if needed in the situation of return of SVT. Dr. Graciela Husbands started amiodarone 200mg  BID low dose for suppression. Has pacer for backup.   2) Elevated troponin - demand in the setting of SVT. Prior cath with no CAD.   3) Cardiomyopathy/chronic systolic HF - appears well compensated. No SOB, no orthopnea. Difficult with worsening renal failure.  4) Renal failure/AKI - ?cardiorenal, low output, contrast? Avoiding ACE-I. Worsening. Somnolence in part from renal failure.   5) End of life issues -  palliative care team.   I will sign off. Discussed with Dr. Elvera Lennox.    [name], MARK 09/30/2013, 8:48 AM

## 2013-10-01 LAB — CBC
HEMATOCRIT: 30.8 % — AB (ref 39.0–52.0)
HEMOGLOBIN: 9.6 g/dL — AB (ref 13.0–17.0)
MCH: 28.4 pg (ref 26.0–34.0)
MCHC: 31.2 g/dL (ref 30.0–36.0)
MCV: 91.1 fL (ref 78.0–100.0)
Platelets: 77 10*3/uL — ABNORMAL LOW (ref 150–400)
RBC: 3.38 MIL/uL — ABNORMAL LOW (ref 4.22–5.81)
RDW: 21.3 % — AB (ref 11.5–15.5)
WBC: 10.8 10*3/uL — ABNORMAL HIGH (ref 4.0–10.5)

## 2013-10-01 LAB — BASIC METABOLIC PANEL
BUN: 132 mg/dL — AB (ref 6–23)
CALCIUM: 7.1 mg/dL — AB (ref 8.4–10.5)
CO2: 35 mEq/L — ABNORMAL HIGH (ref 19–32)
Chloride: 96 mEq/L (ref 96–112)
Creatinine, Ser: 3.88 mg/dL — ABNORMAL HIGH (ref 0.50–1.35)
GFR calc Af Amer: 16 mL/min — ABNORMAL LOW (ref >90)
GFR, EST NON AFRICAN AMERICAN: 13 mL/min — AB (ref >90)
Glucose, Bld: 97 mg/dL (ref 70–99)
Potassium: 5.3 mEq/L (ref 3.7–5.3)
SODIUM: 142 meq/L (ref 137–147)

## 2013-10-01 LAB — GLUCOSE, CAPILLARY
GLUCOSE-CAPILLARY: 95 mg/dL (ref 70–99)
GLUCOSE-CAPILLARY: 86 mg/dL (ref 70–99)
Glucose-Capillary: 82 mg/dL (ref 70–99)
Glucose-Capillary: 82 mg/dL (ref 70–99)

## 2013-10-01 MED ORDER — FUROSEMIDE 10 MG/ML IJ SOLN
20.0000 mg | Freq: Once | INTRAMUSCULAR | Status: AC
  Administered 2013-10-01: 20 mg via INTRAVENOUS
  Filled 2013-10-01 (×2): qty 2

## 2013-10-01 MED ORDER — MORPHINE SULFATE (CONCENTRATE) 10 MG /0.5 ML PO SOLN
5.0000 mg | ORAL | Status: DC | PRN
  Administered 2013-10-02 – 2013-10-03 (×2): 5 mg via ORAL
  Filled 2013-10-01 (×2): qty 0.5

## 2013-10-01 NOTE — Progress Notes (Signed)
Palliative Care Team at [facility] Progress Note   SUBJECTIVE: Awake, much weaker today, not oriented. Denies pain.  OBJECTIVE: Vital Signs: BP 120/43  Pulse 60  Temp(Src) 97.4 F (36.3 C) (Oral)  Resp 15  Ht 5\' 7"  (1.702 m)  Wt 65.2 kg (143 lb 11.8 oz)  BMI 22.51 kg/m2  SpO2 99%   Intake and Output: 02/26 0701 - 02/27 0700 In: 120 [P.O.:120] Out: 325 [Urine:325]  Physical Exam: General: Vital signs reviewed, he is extremely frail, developing upper airway congestion  Head: Normocephalic, atraumatic.  Lungs:  Shallow, +Rhonchi  Heart: + murmur  Abdomen:  BS normoactive. Soft, Nondistended, non-tender.  No masses or organomegaly.  Extremities: +edema.    No Known Allergies  Medications: Scheduled Meds:  . amiodarone  200 mg Oral BID  . feeding supplement (ENSURE COMPLETE)  237 mL Oral BID BM  . insulin aspart  0-9 Units Subcutaneous TID WC  . pantoprazole  40 mg Oral BID AC  . saccharomyces boulardii  250 mg Oral BID  . sodium chloride  10-40 mL Intracatheter Q12H    Continuous Infusions:    PRN Meds: acetaminophen, bisacodyl, docusate sodium, oxyCODONE-acetaminophen, sodium chloride, traMADol  Labs: CBC    Component Value Date/Time   WBC 10.8* 10/01/2013 0435   RBC 3.38* 10/01/2013 0435   HGB 9.6* 10/01/2013 0435   HCT 30.8* 10/01/2013 0435   PLT 77* 10/01/2013 0435   MCV 91.1 10/01/2013 0435   MCH 28.4 10/01/2013 0435   MCHC 31.2 10/01/2013 0435   RDW 21.3* 10/01/2013 0435   LYMPHSABS 0.9 09/25/2013 0400   MONOABS 0.7 09/25/2013 0400   EOSABS 0.0 09/25/2013 0400   BASOSABS 0.0 09/25/2013 0400    CMET     Component Value Date/Time   NA 142 10/01/2013 0435   K 5.3 10/01/2013 0435   CL 96 10/01/2013 0435   CO2 35* 10/01/2013 0435   GLUCOSE 97 10/01/2013 0435   BUN 132* 10/01/2013 0435   CREATININE 3.88* 10/01/2013 0435   CALCIUM 7.1* 10/01/2013 0435   PROT 5.3* 09/27/2013 0615   ALBUMIN 2.6* 09/27/2013 0615   AST 24 09/27/2013 0615   ALT 31 09/27/2013 0615   ALKPHOS 154* 09/27/2013 0615   BILITOT 0.7 09/27/2013 0615   GFRNONAA 13* 10/01/2013 0435   GFRAA 16* 10/01/2013 0435    ASSESSMENT/ PLAN: 80 yo man with progressive heart failure-now cardiorenal failure. He appears to be actively dying-I recommended hospice. His daughter Curry Lions is at bedside, I updated her. Phone call placed to APS-awaiting call back.  Ordered Roxanol low dose for dyspnea and pain prn.    15 minutes. Greater than 50%  of this time was spent counseling and coordinating care related to the above assessment and plan.   Edsel Petrin, DO  10/01/2013, 1:43 PM  Please contact Palliative Medicine Team phone at [phone] for questions and concerns.

## 2013-10-01 NOTE — Progress Notes (Signed)
Pt's wife at bedside with daughter Misty Stanley, daughter Aggie Cosier on the telephone. Pt's wife want to take pt home with 24/7 care. Pt's wife do not want Hospice or SNF. Daughter Misty Stanley states they are hiring private duty care 24/7 for pt. Wife given a list for Private Duty Care Agencies and Home Health Agencies List.

## 2013-10-01 NOTE — Progress Notes (Signed)
PROGRESS NOTE  Gillis EndsRobert Staszewski ZOX:[id number] DOB: 03/02/1932 DOA: 09/11/2013 PCP: Arvella NighADCOCK, JIMMIE, MD  Assessment / Plan: Goals of care - patient is with multi organ failure as below, oliguric, hyperkalemic and with minimal PO intake. Palliative care has been consulted; discussed case with his wife who is not fully realizing that he is terminal.  - long discussion today with his daughter [place] Lions[place]ois Oliguric/ATN-potentially postobstructive component  - Potentially secondary to IV contrast secondary to CT angiogram  - FENa: Prerenal initially  - Nephrology consulted 2/21 and have signed off-recommended IV albumin/IV Lasix one-time dose-unfortunately cannot aggressively diurese given hypotension - IV saline started for fluid repletion on 2/21 but was DC as developing anasarca  - Creatinine continues to rise, BUN elevated and rising.  - Renal ultrasound 2/20 = medical renal disease no evidence hydronephrosis  - not a dialysis candidate.  - blood pressure better, will try small dose IV lasix; should help with lung congestion and provide more comfortable breathing  Paroxysmal SVT, heart rate 160  - Likely secondary to beta blocker withdrawal  - Troponin secondary to demand ischemia  - EKG showed T wave inversions, however repeat EKG showed resolution 2/22  - Carotid sinus massage 2/24  - see cards note, appreciated input.  - no further events, d/c telemetry Acute hypoxic respiratory failure due to HCAP (healthcare-associated pneumonia) vs. atelectasis. CTA negative for PE.  - BCx NGTD  - Flu neg  - S. pneumo neg  - Legionella neg  - Antibiotics stop date 09/27/13=10 days for Hcap  Lethargy and confusion ? to infection or heart failure-sporadically and resolves spontaneously.  - ABG with mild hypercapnea, however, pH elevated. Suspect chronic CO2 retention  - Ammonia 25  Chronic systolic heart failure with severe NICM EF 15-20% s/p pacemaker for bradycardia-Anasarca  Recent admission for heart  failure. Discharge weight of 126-lbs and currently 138lbs. Has pronounced JVP and effusions on CXR, however, no overt pulmonary edema. proBNP > 70K.  - See above discussion-hypotension limits diuresis  Blood leaking from urethra likely due to I/O cath trauma,  - Developed recurrent GU bleed after palcing foley for assesment renal/vol status  - S./P. cystoscopy, placement of Foley over wire  - Would keep in for comfort  Hypertension, at risk for hypotension  - D/c BB  _amio started per Cardiology  T2DM, hypoglycemic A1c 9.2 last month  - SSI sensitive re-started 2/21 given mild hyprglycemia [200's]  - D/c levemir  - Liberalize diet patient is taking in less than 50% of all meals  Bilateral hip and knee pain, worse than baseline  - X-rays of hip and knee: No fracture  Left arm basilic vein thrombus  - Wife declines a/c b/c she feels it caused the large bruise of his right side  Deconditioning due to heart failure, recurrent infections, severe protein calorie malnutrition  Multiple pressure ulcers, feet, sacrum, back  - Air mattress  - Booties  - decubituc reviewed 2/23    Severe protein calorie malnutrition  - Supplements, liberalized diet as tolerated Leukocytosis,improved since admission  - Trend WBC  -  abx stop date 2/23  Normocytic anemia, recently [place] [facility] [place], occult stool positive. Patient is not a good candidate for surgery or colonoscopy  - Iron studies consistent with anemia of chronic disease but given occult positive stool, may be mixed picture  - Feraheme x 1  - b12 1004 folate wnl, TSH 0.834  - Occult stool positive  - S/p 2 units  PRBC 09/20/13, stable since Thrombocytopenia Hypokalemia, now hyperkalemic due to renal failure  Hypothermia due to severe heart failure - Warming blanket if needed  Thrush: Continue oral fluconazole once daily, stop 2/26  Diet: Dysphagia  Fluids: NS DVT Prophylaxis: SCD  Code Status: DNR Family  Communication: daughter Disposition Plan: inpatient  Consultants:  Palliative  Nephrology  Cardiology   Procedures:  none   Antibiotics Vancomycin 2/15-2/18 Zosyn x 1 2/15 Cefepime 2/15 >> 2/18  Levaquin 2/18>>>2/21  Fluconazole 2/16>>>2/26  HPI/Subjective: - less responsive today  Objective: Filed Vitals:   09/30/13 0700 09/30/13 1435 09/30/13 2215 10/01/13 0621  BP: 93/40 125/48 95/41 108/40  Pulse: 60 59 60 61  Temp: 97.4 F (36.3 C) 97.4 F (36.3 C) 97.6 F (36.4 C) 97.6 F (36.4 C)  TempSrc: Oral Axillary Axillary Axillary  Resp: 16 16 16 16   Height:      Weight: 66.1 kg (145 lb 11.6 oz)   65.2 kg (143 lb 11.8 oz)  SpO2: 100% 100% 100% 100%    Intake/Output Summary (Last 24 hours) at 10/01/13 1244 Last data filed at 10/01/13 0647  Gross per 24 hour  Intake    120 ml  Output    325 ml  Net   -205 ml   Filed Weights   09/29/13 0515 09/30/13 0700 10/01/13 0621  Weight: 63.864 kg (140 lb 12.7 oz) 66.1 kg (145 lb 11.6 oz) 65.2 kg (143 lb 11.8 oz)    Exam:   General:  NAD, cachectic   Cardiovascular: regular rate and rhythm, without MRG  Respiratory: decreased breath sounds, no wheezing  Abdomen: soft, positive bowel sounds  MSK: 2+ peripheral edema upper and lower extremities  Data Reviewed: Basic Metabolic Panel:  Recent Labs Lab 09/25/13 2010 09/27/13 0615 09/29/13 1129 09/30/13 0435 10/01/13 0435  NA 143 143 141 142 142  K 5.3 5.1 6.2* 5.5* 5.3  CL 96 97 96 96 96  CO2 37* 35* 33* 33* 35*  GLUCOSE 238* 179* 336* 221* 97  BUN 88* 104* 123* 133* 132*  CREATININE 2.63* 3.04* 3.75* 3.78* 3.88*  CALCIUM 8.1* 8.3* 7.6* 7.2* 7.1*  PHOS 6.2*  --   --   --   --    Liver Function Tests:  Recent Labs Lab 09/25/13 2010 09/27/13 0615  AST  --  24  ALT  --  31  ALKPHOS  --  154*  BILITOT  --  0.7  PROT  --  5.3*  ALBUMIN 2.2* 2.6*   CBC:  Recent Labs Lab 09/25/13 0400  09/27/13 0615 09/27/13 1035 09/28/13 0520  09/29/13 0405 09/30/13 0435 10/01/13 0435  WBC 7.7  < > 9.1  --  10.4 9.2 9.2 10.8*  NEUTROABS 6.1  --   --   --   --   --   --   --   HGB 9.6*  < > 9.0* 9.5* 9.9* 9.1* 9.2* 9.6*  HCT 31.2*  < > 30.0* 31.0* 31.7* 30.2* 30.4* 30.8*  MCV 92.9  < > 93.8  --  91.4 92.4 92.7 91.1  PLT 101*  < > 76*  --  79* 70* 80* 77*  < > = values in this interval not displayed. Cardiac Enzymes:  Recent Labs Lab 09/26/13 0845 09/26/13 1745  TROPONINI 0.79* 1.01*   BNP (last 3 results)  Recent Labs  08/06/13 1121 08/12/13 1649 09/19/13 0100  PROBNP >70000.0* 61891.0* >70000.0*   CBG:  Recent Labs Lab 09/30/13 1133 09/30/13 1637 09/30/13 2226 10/01/13  0745 10/01/13 1125  GLUCAP 220* 227* 128* 95 86   Studies: No results found.  Scheduled Meds: . amiodarone  200 mg Oral BID  . feeding supplement (ENSURE COMPLETE)  237 mL Oral BID BM  . insulin aspart  0-9 Units Subcutaneous TID WC  . pantoprazole  40 mg Oral BID AC  . saccharomyces boulardii  250 mg Oral BID  . 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 Any transcriptional errors are unintentional.   Pamella Pert, MD [facility] Pager [phone]. If 7 PM - 7 AM, please contact night-coverage at [email or web address], password [facility] 10/01/2013, 12:44 PM  LOS: 13 days

## 2013-10-02 LAB — BASIC METABOLIC PANEL
BUN: 138 mg/dL — AB (ref 6–23)
CHLORIDE: 97 meq/L (ref 96–112)
CO2: 34 mEq/L — ABNORMAL HIGH (ref 19–32)
Calcium: 7.4 mg/dL — ABNORMAL LOW (ref 8.4–10.5)
Creatinine, Ser: 4.14 mg/dL — ABNORMAL HIGH (ref 0.50–1.35)
GFR calc Af Amer: 14 mL/min — ABNORMAL LOW (ref >90)
GFR calc non Af Amer: 12 mL/min — ABNORMAL LOW (ref >90)
GLUCOSE: 94 mg/dL (ref 70–99)
Potassium: 5.5 mEq/L — ABNORMAL HIGH (ref 3.7–5.3)
SODIUM: 144 meq/L (ref 137–147)

## 2013-10-02 LAB — CBC
HCT: 33.9 % — ABNORMAL LOW (ref 39.0–52.0)
Hemoglobin: 10.7 g/dL — ABNORMAL LOW (ref 13.0–17.0)
MCH: 28.9 pg (ref 26.0–34.0)
MCHC: 31.6 g/dL (ref 30.0–36.0)
MCV: 91.6 fL (ref 78.0–100.0)
PLATELETS: 84 10*3/uL — AB (ref 150–400)
RBC: 3.7 MIL/uL — ABNORMAL LOW (ref 4.22–5.81)
RDW: 21.6 % — ABNORMAL HIGH (ref 11.5–15.5)
WBC: 11 10*3/uL — AB (ref 4.0–10.5)

## 2013-10-02 LAB — GLUCOSE, CAPILLARY
GLUCOSE-CAPILLARY: 111 mg/dL — AB (ref 70–99)
Glucose-Capillary: 78 mg/dL (ref 70–99)
Glucose-Capillary: 107 mg/dL — ABNORMAL HIGH (ref 70–99)
Glucose-Capillary: 111 mg/dL — ABNORMAL HIGH (ref 70–99)

## 2013-10-02 MED ORDER — FUROSEMIDE 10 MG/ML IJ SOLN
40.0000 mg | Freq: Once | INTRAMUSCULAR | Status: AC
  Administered 2013-10-02: 40 mg via INTRAVENOUS
  Filled 2013-10-02: qty 4

## 2013-10-02 MED ORDER — DEXTROSE-NACL 5-0.45 % IV SOLN
INTRAVENOUS | Status: AC
  Administered 2013-10-02: 10:00:00 via INTRAVENOUS

## 2013-10-02 NOTE — Progress Notes (Signed)
PROGRESS NOTE  Frederick [name] ZOX:[id number] DOB: 1931-12-12 DOA: 11-Oct-2013 PCP: Arvella Nigh, MD  Assessment / Plan: Goals of care - patient is with multi organ failure as below, oliguric, hyperkalemic and with minimal PO intake. Palliative care has been consulted; discussed case with his wife who is not fully realizing that he is terminal.  - Discussed again with Mrs. Hyams today about patient's condition. She still wishes to take him home, however she wants to continue medical care. He cannot be discharged given continued need to monitor potassium levels and kidney function and blood glucose given hypoglycemia this morning. He can be discharged from the hospital if the POA decides for full comfort, however she does not wish this at this point, because these means hospice for her. She still has difficulties grasping the severity of his illness. Oliguric/ATN-potentially postobstructive component  - Potentially secondary to IV contrast secondary to CT angiogram  - FENa: Prerenal initially  - Nephrology consulted 2/21 and have signed off-recommended IV albumin/IV Lasix one-time dose-unfortunately cannot aggressively diurese given hypotension - IV saline started for fluid repletion on 2/21 but was DC as developing anasarca  - Creatinine continues to rise, BUN elevated and rising.  - Renal ultrasound 2/20 = medical renal disease no evidence hydronephrosis  - not a dialysis candidate.  - Repeat Lasix today. Hypoglycemia - due to no by mouth intake. 250 cc D5 half normal supplementation. Paroxysmal SVT, heart rate 160  - Likely secondary to beta blocker withdrawal  - Troponin secondary to demand ischemia  - EKG showed T wave inversions, however repeat EKG showed resolution 2/22  - Carotid sinus massage 2/24  - see cards note, appreciated input.  - no further events, d/c telemetry Acute hypoxic respiratory failure due to HCAP (healthcare-associated pneumonia) vs. atelectasis. CTA negative for  PE.  - BCx NGTD  - Flu neg  - S. pneumo neg  - Legionella neg  - Antibiotics stop date 09/27/13=10 days for Hcap  Lethargy and confusion ? to infection or heart failure-sporadically and resolves spontaneously.  - ABG with mild hypercapnea, however, pH elevated. Suspect chronic CO2 retention  - Ammonia 25  Chronic systolic heart failure with severe NICM EF 15-20% s/p pacemaker for bradycardia-Anasarca  Recent admission for heart failure. Discharge weight of 126-lbs and currently 138lbs. Has pronounced JVP and effusions on CXR, however, no overt pulmonary edema. proBNP > 70K.  Blood leaking from urethra likely due to I/O cath trauma,  - Developed recurrent GU bleed after palcing foley for assesment renal/vol status  - S./P. cystoscopy, placement of Foley over wire  - Would keep in for comfort  Hypertension, at risk for hypotension  - D/c BB  _amio started per Cardiology  T2DM, hypoglycemic A1c 9.2 last month  - SSI sensitive re-started 2/21 given mild hyprglycemia [200's]  - D/c levemir  - Liberalize diet patient is taking in less than 50% of all meals  Bilateral hip and knee pain, worse than baseline  - X-rays of hip and knee: No fracture  Left arm basilic vein thrombus  - Wife declines a/c b/c she feels it caused the large bruise of his right side  Deconditioning due to heart failure, recurrent infections, severe protein calorie malnutrition  Multiple pressure ulcers, feet, sacrum, back  - Air mattress  - Booties  - decubituc reviewed 2/23    Severe protein calorie malnutrition  - Supplements, liberalized diet as tolerated Leukocytosis,improved since admission  - Trend WBC  - abx stop date 2/23  Normocytic  anemia, recently received blood transfusion of 2 units from [facility], occult stool positive. Patient is not a good candidate for surgery or colonoscopy  - Iron studies consistent with anemia of chronic disease but given occult positive stool, may be mixed picture  - Feraheme x 1  -  b12 1004 folate wnl, TSH 0.834  - Occult stool positive  - S/p 2 units PRBC 09/20/13, stable since Thrombocytopenia Hypokalemia, now hyperkalemic due to renal failure  Hypothermia due to severe heart failure - Warming blanket if needed  Thrush: Continue oral fluconazole once daily, stop 2/26  Diet: Dysphagia  Fluids: NS DVT Prophylaxis: SCD  Code Status: DNR Family Communication: Wife at the bedside Disposition Plan: inpatient  Consultants:  Palliative  Nephrology  Cardiology   Procedures:  none   Antibiotics Vancomycin 2/15-2/18 Zosyn x 1 2/15 Cefepime 2/15 >> 2/18  Levaquin 2/18>>>2/21  Fluconazole 2/16>>>2/26  HPI/Subjective: - Minimally responsive. Opens eyes intermittently.  Objective: Filed Vitals:   10/01/13 0621 10/01/13 1312 10/01/13 2132 10/02/13 0456  BP: 108/40 120/43 124/44 135/43  Pulse: 61 60 68 72  Temp: 97.6 F (36.4 C) 97.4 F (36.3 C) 98.4 F (36.9 C) 98.4 F (36.9 C)  TempSrc: Axillary Oral Oral Oral  Resp: 16 15 22 30   Height:      Weight: 65.2 kg (143 lb 11.8 oz)   66 kg (145 lb 8.1 oz)  SpO2: 100% 99% 100% 97%    Intake/Output Summary (Last 24 hours) at 10/02/13 0913 Last data filed at 10/02/13 0500  Gross per 24 hour  Intake      0 ml  Output    250 ml  Net   -250 ml   Filed Weights   09/30/13 0700 10/01/13 0621 10/02/13 0456  Weight: 66.1 kg (145 lb 11.6 oz) 65.2 kg (143 lb 11.8 oz) 66 kg (145 lb 8.1 oz)   Exam:  General:  NAD, cachectic   Cardiovascular: regular rate and rhythm, without MRG  Respiratory: decreased breath sounds, no wheezing  Abdomen: soft, positive bowel sounds  MSK: 2+ peripheral edema upper and lower extremities  Data Reviewed: Basic Metabolic Panel:  Recent Labs Lab 09/25/13 2010 09/27/13 0615 09/29/13 1129 09/30/13 0435 10/01/13 0435  NA 143 143 141 142 142  K 5.3 5.1 6.2* 5.5* 5.3  CL 96 97 96 96 96  CO2 37* 35* 33* 33* 35*  GLUCOSE 238* 179* 336* 221* 97  BUN 88* 104* 123*  133* 132*  CREATININE 2.63* 3.04* 3.75* 3.78* 3.88*  CALCIUM 8.1* 8.3* 7.6* 7.2* 7.1*  PHOS 6.2*  --   --   --   --    Liver Function Tests:  Recent Labs Lab 09/25/13 2010 09/27/13 0615  AST  --  24  ALT  --  31  ALKPHOS  --  154*  BILITOT  --  0.7  PROT  --  5.3*  ALBUMIN 2.2* 2.6*   CBC:  Recent Labs Lab 09/28/13 0520 09/29/13 0405 09/30/13 0435 10/01/13 0435 10/02/13 0550  WBC 10.4 9.2 9.2 10.8* 11.0*  HGB 9.9* 9.1* 9.2* 9.6* 10.7*  HCT 31.7* 30.2* 30.4* 30.8* 33.9*  MCV 91.4 92.4 92.7 91.1 91.6  PLT 79* 70* 80* 77* 84*   Cardiac Enzymes:  Recent Labs Lab 09/26/13 0845 09/26/13 1745  TROPONINI 0.79* 1.01*   BNP (last 3 results)  Recent Labs  08/06/13 1121 08/12/13 1649 09/19/13 0100  PROBNP >70000.0* 61891.0* >70000.0*   CBG:  Recent Labs Lab 10/01/13 0745 10/01/13 1125 10/01/13  1807 10/01/13 2130 10/02/13 0814  GLUCAP 95 86 82 82 78   Studies: No results found.  Scheduled Meds: . amiodarone  200 mg Oral BID  . feeding supplement (ENSURE COMPLETE)  237 mL Oral BID BM  . insulin aspart  0-9 Units Subcutaneous TID WC  . pantoprazole  40 mg Oral BID AC  . saccharomyces boulardii  250 mg Oral BID  . 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 Any  transcriptional errors are unintentional.   Pamella Pert, MD [facility] Pager [phone]. If 7 PM - 7 AM, please contact night-coverage at [email or web address], password [facility] 10/02/2013, 9:13 AM  LOS: 14 days

## 2013-10-03 LAB — BASIC METABOLIC PANEL
BUN: 143 mg/dL — AB (ref 6–23)
CHLORIDE: 96 meq/L (ref 96–112)
CO2: 35 meq/L — AB (ref 19–32)
Calcium: 7.4 mg/dL — ABNORMAL LOW (ref 8.4–10.5)
Creatinine, Ser: 4.32 mg/dL — ABNORMAL HIGH (ref 0.50–1.35)
GFR calc non Af Amer: 12 mL/min — ABNORMAL LOW (ref >90)
GFR, EST AFRICAN AMERICAN: 14 mL/min — AB (ref >90)
Glucose, Bld: 125 mg/dL — ABNORMAL HIGH (ref 70–99)
Potassium: 5.7 mEq/L — ABNORMAL HIGH (ref 3.7–5.3)
Sodium: 143 mEq/L (ref 137–147)

## 2013-10-03 LAB — CBC
HCT: 32.5 % — ABNORMAL LOW (ref 39.0–52.0)
HEMOGLOBIN: 9.9 g/dL — AB (ref 13.0–17.0)
MCH: 28.9 pg (ref 26.0–34.0)
MCHC: 30.5 g/dL (ref 30.0–36.0)
MCV: 94.8 fL (ref 78.0–100.0)
Platelets: 90 10*3/uL — ABNORMAL LOW (ref 150–400)
RBC: 3.43 MIL/uL — AB (ref 4.22–5.81)
RDW: 21.7 % — ABNORMAL HIGH (ref 11.5–15.5)
WBC: 10.9 10*3/uL — AB (ref 4.0–10.5)

## 2013-10-03 LAB — GLUCOSE, CAPILLARY
GLUCOSE-CAPILLARY: 141 mg/dL — AB (ref 70–99)
Glucose-Capillary: 124 mg/dL — ABNORMAL HIGH (ref 70–99)
Glucose-Capillary: 129 mg/dL — ABNORMAL HIGH (ref 70–99)
Glucose-Capillary: 155 mg/dL — ABNORMAL HIGH (ref 70–99)

## 2013-10-03 MED ORDER — SODIUM POLYSTYRENE SULFONATE 15 GM/60ML PO SUSP
15.0000 g | Freq: Once | ORAL | Status: DC
  Filled 2013-10-03: qty 60

## 2013-10-03 NOTE — Progress Notes (Signed)
Dr. Gherghe at bedside 

## 2013-10-03 NOTE — Progress Notes (Signed)
PROGRESS NOTE  Frederick [name] [id number]:[id number] DOB: Jul 20, 1932 DOA: 2013/09/29 PCP: Arvella Nigh, MD  Assessment / Plan: Goals of care - patient is with multi organ failure as below, oliguric, hyperkalemic and with minimal PO intake. Palliative care has been consulted; discussed case with his wife who unfortunately is not fully realizing that he is terminal.  - Discussed again with POA Frederick [name] on 2/28 about patient's condition. She still wishes to take him home, however she wants to continue medical care. Medically, he cannot be discharged given continued medical needs. He could be discharged from the hospital if the [facility] decides for full comfort, however she does not wish this, because this would be equivalent to hospice for her. She still has difficulties grasping the severity of his illness. Oliguric/ATN-potentially postobstructive component  - Potentially secondary to IV contrast secondary to CT angiogram  - FENa: Prerenal initially  - Nephrology consulted 2/21 and have signed off-recommended IV albumin/IV Lasix one-time dose-unfortunately cannot aggressively diurese given hypotension - IV saline started for fluid repletion on 2/21 but was DC as developing anasarca  - Creatinine continues to rise, BUN elevated and rising.  - Renal ultrasound 2/20 = medical renal disease no evidence hydronephrosis  - not a dialysis candidate.  - did not respond to Lasix 2/27 and 2/28. Minimal increase in UOP, worsening renal function.  - repeat kayexalate today, if able to take po. Hypoglycemia - due to no by mouth intake. 250 cc D5 half normal supplementation. Paroxysmal SVT, heart rate 160  - Likely secondary to beta blocker withdrawal  - Troponin secondary to demand ischemia  - EKG showed T wave inversions, however repeat EKG showed resolution 2/22  - Carotid sinus massage 2/24  - see cards note, appreciated input.  - no further events, d/c telemetry Acute hypoxic respiratory failure due to HCAP  (healthcare-associated pneumonia) vs. atelectasis. CTA negative for PE.  - BCx NGTD  - Flu neg  - S. pneumo neg  - Legionella neg  - Antibiotics stop date 09/27/13=10 days for Hcap Chronic systolic heart failure with severe NICM EF 15-20% s/p pacemaker for bradycardia-Anasarca  Recent admission for heart failure. Discharge weight of 126-lbs and currently 138lbs. Has pronounced JVP and effusions on CXR, however, no overt pulmonary edema. proBNP > 70K.  Blood leaking from urethra likely due to I/O cath trauma,  - Developed recurrent GU bleed after palcing foley for assesment renal/vol status  - S./P. cystoscopy, placement of Foley over wire  - Would keep in for comfort  Hypertension, at risk for hypotension  - D/c BB, on amio started per Cardiology  T2DM, hypoglycemic A1c 9.2 last month  - SSI sensitive re-started 2/21 given mild hyprglycemia [200's]  - D/c levemir  - Liberalize diet patient is taking in less than 50% of all meals  Bilateral hip and knee pain, worse than baseline  - X-rays of hip and knee: No fracture  Left arm basilic vein thrombus  - Wife declines a/c b/c she feels it caused the large bruise of his right side  Deconditioning due to heart failure, recurrent infections, severe protein calorie malnutrition  Multiple pressure ulcers, feet, sacrum, back  - Air mattress  - Booties  - decubituc reviewed 2/23    Severe protein calorie malnutrition  - Supplements, liberalized diet as tolerated Leukocytosis,improved since admission  - Trend WBC  - abx stop date 2/23  Normocytic anemia, recently received blood transfusion of 2 units from [place], occult stool positive. Patient is not a good  candidate for surgery or colonoscopy  - Iron studies consistent with anemia of chronic disease but given occult positive stool, may be mixed picture  - Feraheme x 1  - b12 1004 folate wnl, TSH 0.834  - Occult stool positive  - S/p 2 units PRBC 09/20/13, stable  since Thrombocytopenia Hypokalemia, now hyperkalemic due to renal failure  Hypothermia due to severe heart failure - Warming blanket if needed  Thrush: Continue oral fluconazole once daily, stop 2/26  Diet: Dysphagia  Fluids: NS DVT Prophylaxis: SCD  Code Status: DNR Family Communication: none this morning.  Disposition Plan: inpatient  Consultants:  Palliative  Nephrology  Cardiology   Procedures:  none   Antibiotics Vancomycin 2/15-2/18 Zosyn x 1 2/15 Cefepime 2/15 >> 2/18  Levaquin 2/18>>>2/21  Fluconazole 2/16>>>2/26  HPI/Subjective: - Minimally responsive. Does not open eyes today.   Objective: Filed Vitals:   10/02/13 2354 10/03/13 0141 10/03/13 0405 10/03/13 0627  BP: 99/36 101/37 102/42 107/51  Pulse:  60 60 60  Temp:   97.7 F (36.5 C)   TempSrc:   Axillary   Resp:  8 10 8   Height:      Weight:      SpO2:  100% 100% 100%    Intake/Output Summary (Last 24 hours) at 10/03/13 0812 Last data filed at 10/03/13 0407  Gross per 24 hour  Intake    385 ml  Output    265 ml  Net    120 ml   Filed Weights   09/30/13 0700 10/01/13 0621 10/02/13 0456  Weight: 66.1 kg (145 lb 11.6 oz) 65.2 kg (143 lb 11.8 oz) 66 kg (145 lb 8.1 oz)   Exam:  General:  NAD, cachectic   Cardiovascular: regular rate and rhythm, without MRG  Respiratory: coarse breath sounds  Abdomen: soft, positive bowel sounds  MSK: 2+ peripheral edema upper and lower extremities  Data Reviewed: Basic Metabolic Panel:  Recent Labs Lab 09/29/13 1129 09/30/13 0435 10/01/13 0435 10/02/13 1050 10/03/13 0423  NA 141 142 142 144 143  K 6.2* 5.5* 5.3 5.5* 5.7*  CL 96 96 96 97 96  CO2 33* 33* 35* 34* 35*  GLUCOSE 336* 221* 97 94 125*  BUN 123* 133* 132* 138* 143*  CREATININE 3.75* 3.78* 3.88* 4.14* 4.32*  CALCIUM 7.6* 7.2* 7.1* 7.4* 7.4*   Liver Function Tests:  Recent Labs Lab 09/27/13 0615  AST 24  ALT 31  ALKPHOS 154*  BILITOT 0.7  PROT 5.3*  ALBUMIN 2.6*    CBC:  Recent Labs Lab 09/29/13 0405 09/30/13 0435 10/01/13 0435 10/02/13 0550 10/03/13 0423  WBC 9.2 9.2 10.8* 11.0* 10.9*  HGB 9.1* 9.2* 9.6* 10.7* 9.9*  HCT 30.2* 30.4* 30.8* 33.9* 32.5*  MCV 92.4 92.7 91.1 91.6 94.8  PLT 70* 80* 77* 84* 90*   Cardiac Enzymes:  Recent Labs Lab 09/26/13 0845 09/26/13 1745  TROPONINI 0.79* 1.01*   BNP (last 3 results)  Recent Labs  08/06/13 1121 08/12/13 1649 09/19/13 0100  PROBNP >70000.0* 61891.0* >70000.0*   CBG:  Recent Labs Lab 10/02/13 0814 10/02/13 1225 10/02/13 1656 10/02/13 2140 10/03/13 0727  GLUCAP 78 107* 111* 111* 124*   Studies: No results found.  Scheduled Meds: . amiodarone  200 mg Oral BID  . feeding supplement (ENSURE COMPLETE)  237 mL Oral BID BM  . insulin aspart  0-9 Units Subcutaneous TID WC  . pantoprazole  40 mg Oral BID AC  . saccharomyces boulardii  250 mg Oral BID  .  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 Any transcriptional errors are unintentional.   Pamella Pert, MD [facility] Pager [phone]. If 7 PM - 7 AM, please contact night-coverage at [email or web address], password [facility] 10/03/2013, 8:12 AM  LOS: 15 days

## 2013-10-03 NOTE — Progress Notes (Signed)
Report called to Ladona Ridgel, RN, on 6E. Pt and family aware of transfer to room 1616. Pt transferred in bed by CN, Irving Burton, and NTs Randa Evens and [place]. Transferred w/ chart, meds, and all personal belongings.

## 2013-10-03 NOTE — Progress Notes (Addendum)
Pt's breathing less labored. Family at bedside singing to pt. Wife requests to speak w/ Dr Elvera Lennox. He has been paged and is aware. Will be up to see family when done in ED.

## 2013-10-03 NOTE — Progress Notes (Signed)
Pt noted to be gasping. PRN dose of Roxanol given. Will monitor. Family at bedside.

## 2013-10-03 NOTE — Progress Notes (Addendum)
Palliative Medicine Team Progress Note  [name] is unresponsive, his respirations are very shallow, he has upper airway congestion.  Vital signs reviewed, hypotensive.  I met with Frederick [name] and her daughter at bedside. She insists on taking him home-he is however actively dying and she cannot take care of herself-she reports that she has hired 24/7 caregivers but this has not been verified. She has extreme misperceptions about Hospice care- she insists that she knows from her prior experience with hospice and her mother back in 2002. She states that hospice is just out to make money, they only pay for morphine and are in the business of killing people- she thought hospice would not even provide him with oxygen or give him insulin if his sugar was high or even try to feed him. She says that her husband previously refused hospice and that he just wanted to go home-he knew he was dying before he became unresponsive-I suspect home may have meant his spiritual home but I cannot be certain of this. Frederick [name] called her attorney supposedly while I was in the room-I offered to speak with the attorney directly or hold the phone close so they could hear everything I had to say regarding his condition. Frederick [name]'s daughter appears to be growing frustrated with her mother -she asked for prognostic honestly- I think [name] has hours to days to live- his wife wants continued medical management- I think she is concerned that her children are going to put her in a nursing home once he dies which is what she said during the interview to her daughter when they were arguing.  I reassured Frederick [name] that my only interest was to make sure that her husband did not suffer at EOL-he required comfort meds this morning for severe dyspnea and air hunger/anxiety. I inquired about the APS involvement to find out if home was even a realistic option-APS has not returned my call. Even with Hospice I am not sure that is a safe  discharge plan and I am very concerned about Frederick [name]'s health and her capacity as well.  [name] is actively dying- she would never approve hospice in the hospital and he is too unstable for transport-she is asking for [place] to treat him medically.  I offered to provide written information on Hospice for her review.  Continue prn roxanol low dose for his pain and dyspnea.  35 minutes. Greater than 50%  of this time was spent counseling and coordinating care related to the above assessment and plan.   Lane Hacker, DO Palliative Medicine

## 2013-10-03 DEATH — deceased

## 2013-10-04 LAB — BASIC METABOLIC PANEL
BUN: 145 mg/dL — ABNORMAL HIGH (ref 6–23)
CO2: 34 mEq/L — ABNORMAL HIGH (ref 19–32)
CREATININE: 4.68 mg/dL — AB (ref 0.50–1.35)
Calcium: 7.3 mg/dL — ABNORMAL LOW (ref 8.4–10.5)
Chloride: 100 mEq/L (ref 96–112)
GFR calc non Af Amer: 11 mL/min — ABNORMAL LOW (ref >90)
GFR, EST AFRICAN AMERICAN: 12 mL/min — AB (ref >90)
Glucose, Bld: 160 mg/dL — ABNORMAL HIGH (ref 70–99)
Potassium: 6.4 mEq/L — ABNORMAL HIGH (ref 3.7–5.3)
SODIUM: 146 meq/L (ref 137–147)

## 2013-10-04 LAB — CBC
HCT: 34.6 % — ABNORMAL LOW (ref 39.0–52.0)
Hemoglobin: 10.1 g/dL — ABNORMAL LOW (ref 13.0–17.0)
MCH: 28.3 pg (ref 26.0–34.0)
MCHC: 29.2 g/dL — ABNORMAL LOW (ref 30.0–36.0)
MCV: 96.9 fL (ref 78.0–100.0)
PLATELETS: 88 10*3/uL — AB (ref 150–400)
RBC: 3.57 MIL/uL — ABNORMAL LOW (ref 4.22–5.81)
RDW: 21.1 % — AB (ref 11.5–15.5)
WBC: 12.1 10*3/uL — ABNORMAL HIGH (ref 4.0–10.5)

## 2013-10-04 MED ORDER — SODIUM CHLORIDE 0.9 % IV SOLN
1.0000 g | Freq: Once | INTRAVENOUS | Status: DC
  Filled 2013-10-04: qty 10

## 2013-10-04 NOTE — Progress Notes (Signed)
NUTRITION FOLLOW UP  Intervention:   Per palliative care notes, plan is for comfort feeds.  Per MD notes, pt actively dying, nutrition signing off   Nutrition Dx:   Inadequate oral intake related to decreased appetite as evidenced by PO intake <75%-ongoing   Goal:   Pt to meet >/= 90% of their estimated nutrition needs - not met    Assessment:   2/16: -Pt reported poor appetite since Christmas. Diet recall indicated pt skips breakfast, and will try to eat 2 meals per day. Will drink Ensure occasionally. 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  -Positive for weight loss  -Tolerates soft foods at home. RN noted pt refused breakfast and ate small amounts of apple sauce and ice cream.Drank one Ensure. Pt reported to enjoy supplement and will continue with regmien. Has hx of DM2, but has minimal intake of CHO from meals  -Pt also willing to try MagicCup as snacks. Inquired about different foods that would be good for his skin. Encouraged pt to eat protein sources with each meals and continue with supplement intake  -WOC evaluated pt. Noted unstageable bilateral heel and stage 3 sacral pressure ulcers. Expressed concern for nutritional status  2/20: -Pt lethargic and unresponsive during time of follow up -PO intake 0% -Per discussion with RN, pt does not consume any of Dys1 foods. Diet has been largely liquid based-juices, water, Ensure.   -Will drink Ensure supplements. RN has been using MagicCup and applesauce for medication. -MD noted pt good candidate for palliative care with SNF placement, however, family declining at this time -Wound care measures in place for multiple pressure ulcers  2/23: -48 hour calorie count completed, pt consuming <25% of estimated nutritional needs  3/2:  -Palliative care team met with family 2/26. Per their notes, plan is comfort feeding as  tolerated with prognosis being hours to days. Per MD notes on 3/1, pt actively dying.   Height: Ht Readings from Last 1 Encounters:  09/19/13 _0  (1.702 m)    Weight Status:   Wt Readings from Last 1 Encounters:  10/02/13 145 lb 8.1 oz (66 kg)  Admit wt:        135 lb 9.3 oz (61.5 kg)  Net I/Os: +3.9L  Estimated needs:  Kcal: 1700-1900  Protein: 75-85 gram  Fluid: >/=1700 ml/daily   Skin: unstageable left heel ulcer, stage 3 right heel ulcer and sacral pressure ulcer, +3 generalized edema, +2 RUE, LUE, LLE, RLE edema   Diet Order: No diet entered    Intake/Output Summary (Last 24 hours) at 10/04/13 0854 Last data filed at 10/04/13 0505  Gross per 24 hour  Intake      0 ml  Output    175 ml  Net   -175 ml    Last BM: 2/26   Labs:   Recent Labs Lab 10/02/13 1050 10/03/13 0423 10/04/13 0457  NA 144 143 146  K 5.5* 5.7* 6.4*  CL 97 96 100  CO2 34* 35* 34*  BUN 138* 143* 145*  CREATININE 4.14* 4.32* 4.68*  CALCIUM 7.4* 7.4* 7.3*  GLUCOSE 94 125* 160*    CBG (last 3)   Recent Labs  10/03/13 1154 10/03/13 1824 10/03/13 2105  GLUCAP 129* 141* 155*    Scheduled Meds: . amiodarone  200 mg Oral BID  . calcium gluconate  1 g Intravenous Once  . feeding supplement (ENSURE COMPLETE)  237 mL Oral BID BM  . insulin aspart  0-9 Units Subcutaneous TID WC  . pantoprazole  40 mg Oral BID AC  . saccharomyces boulardii  250 mg Oral BID  . sodium chloride  10-40 mL Intracatheter Q12H  . sodium polystyrene  15 g Oral Once     Mikey College MS, RD, LDN [phone] Pager [phone] After Hours Pager

## 2013-10-04 NOTE — Progress Notes (Signed)
PROGRESS NOTE  Gillis EndsRobert Elie ZOX:[id number] DOB: 10/17/1931 DOA: 09/17/2013 PCP: Arvella NighADCOCK, JIMMIE, MD  Assessment / Plan: Goals of care - patient is with multi organ failure as below, oliguric, hyperkalemic and with minimal PO intake. Palliative care has been consulted and has followed patient while hospitalized.  - Discussed again with POA Mrs. Sylvain on 2/28 about patient's condition. She still wishes to take him home, however she wants to continue medical care. Medically, he cannot be discharged. He [facility] be [facility], however she does not wish this, because this would be equivalent to hospice for her. She still has difficulties grasping the severity of his illness. Hyperkalemia - increasing K due to oliguric renal failure. Not a dialysis candidate. Unable to take Kayexalate. Temporize with calcium gluconate. - did not respond to Lasix 2/27 and 2/28. Minimal increase in UOP, worsening renal function.  Oliguric/ATN-potentially postobstructive component  - Potentially secondary to IV contrast secondary to CT angiogram, nephrology has been consulted 2/21 and have signed off-recommended IV albumin/IV Lasix one-time dose-unfortunately cannot aggressively diurese given hypotension. Patient underwent a renal ultrasound on 2/20 which showed medical renal disease without evidence hydronephrosis  Hypoglycemia - intermittent. Paroxysmal SVT, heart rate 160 - Likely secondary to beta blocker withdrawal. He had a Troponin leak likely secondary to demand ischemia. EKG showed T wave inversions, however repeat EKG showed resolution 2/22. His SVT improved with Carotid sinus massage 2/24. Cardiology has been consulted, and evaluate the patient while hospitalized. No further events on the telemetry. Acute hypoxic respiratory failure due to HCAP (healthcare-associated pneumonia) vs. atelectasis. CTA negative for PE. BCx NGTD, Flu neg, S. pneumo neg, Legionella neg.  Antibiotics stop date 09/27/13=10 days for Hcap Chronic systolic heart failure with severe NICM EF 15-20% s/p pacemaker for bradycardia-Anasarca  Recent admission for heart failure. Discharge weight of 126-lbs and currently becoming increasingly fluid overload view to renal failure. Blood leaking from urethra likely due to I/O cath trauma, - Developed recurrent GU bleed after palcing foley for assesment renal/vol status. S./P. cystoscopy, placement of Foley over wire. Would keep in for comfort  Hypertension, at risk for hypotension - D/c BB, on amio started per Cardiology  T2DM, hypoglycemic A1c 9.2 last month - SSI sensitive  Bilateral hip and knee pain, worse than baseline - X-rays of hip and knee: No fracture  Left arm basilic vein thrombus - Wife declines a/c b/c she feels it caused the large bruise of his right side  Deconditioning due to heart failure, recurrent infections, severe protein calorie malnutrition  Multiple pressure ulcers, feet, sacrum, back - Air mattress  Severe protein calorie malnutrition - currently unable to take any by mouth intake Leukocytosis Normocytic anemia, recently [place] [facility] [place] [facility] [place], occult stool positive. Patient is not a good candidate for surgery or colonoscopy - Iron studies consistent with anemia of chronic disease but given occult positive stool, may be mixed picture. Feraheme x 1 b12 1004 folate wnl, TSH 0.834. Occult stool positive. S/p 2 units PRBC 09/20/13, stable since Thrombocytopenia Hypokalemia, now hyperkalemic due to renal failure  Hypothermia due to severe heart failure - Warming blanket if needed  Thrush: Continue oral fluconazole once daily, stop 2/26  Diet: Dysphagia  Fluids: NS DVT Prophylaxis: SCD  Code Status: DNR Family Communication: none this morning.  Disposition Plan: inpatient  Consultants:  Palliative  Nephrology  Cardiology   Procedures:  none   Antibiotics Vancomycin 2/15-2/18 Zosyn x 1  2/15 Cefepime 2/15 >> 2/18  Levaquin 2/18>>>2/21  Fluconazole 2/16>>>2/26  HPI/Subjective: - Unresponsive  Objective: Filed Vitals:   10/03/13 2100 10/04/13 0100 10/04/13 0345 10/04/13 0505  BP: 119/61   121/40  Pulse: 60   60  Temp: 97.8 F (36.6 C)   99.4 F (37.4 C)  TempSrc: Oral   Oral  Resp: 12   16  Height:      Weight:      SpO2: 100% 100% 100% 100%    Intake/Output Summary (Last 24 hours) at 10/04/13 1251 Last data filed at 10/04/13 0505  Gross per 24 hour  Intake      0 ml  Output    175 ml  Net   -175 ml   Filed Weights   09/30/13 0700 10/01/13 0621 10/02/13 0456  Weight: 66.1 kg (145 lb 11.6 oz) 65.2 kg (143 lb 11.8 oz) 66 kg (145 lb 8.1 oz)   Exam:  General:  NAD, cachectic   Cardiovascular: regular rate and rhythm, without MRG  Respiratory: coarse breath sounds  Abdomen: soft, positive bowel sounds  MSK: 2+ peripheral edema upper and lower extremities  Data Reviewed: Basic Metabolic Panel:  Recent Labs Lab 09/30/13 0435 10/01/13 0435 10/02/13 1050 10/03/13 0423 10/04/13 0457  NA 142 142 144 143 146  K 5.5* 5.3 5.5* 5.7* 6.4*  CL 96 96 97 96 100  CO2 33* 35* 34* 35* 34*  GLUCOSE 221* 97 94 125* 160*  BUN 133* 132* 138* 143* 145*  CREATININE 3.78* 3.88* 4.14* 4.32* 4.68*  CALCIUM 7.2* 7.1* 7.4* 7.4* 7.3*   CBC:  Recent Labs Lab 09/30/13 0435 10/01/13 0435 10/02/13 0550 10/03/13 0423 10/04/13 0457  WBC 9.2 10.8* 11.0* 10.9* 12.1*  HGB 9.2* 9.6* 10.7* 9.9* 10.1*  HCT 30.4* 30.8* 33.9* 32.5* 34.6*  MCV 92.7 91.1 91.6 94.8 96.9  PLT 80* 77* 84* 90* 88*   BNP (last 3 results)  Recent Labs  08/06/13 1121 08/12/13 1649 09/19/13 0100  PROBNP >70000.0* 61891.0* >70000.0*   CBG:  Recent Labs Lab 10/02/13 2140 10/03/13 0727 10/03/13 1154 10/03/13 1824 10/03/13 2105  GLUCAP 111* 124* 129* 141* 155*   Scheduled Meds: . amiodarone  200 mg Oral BID  . calcium gluconate  1 g Intravenous Once  . feeding supplement  (ENSURE COMPLETE)  237 mL Oral BID BM  . insulin aspart  0-9 Units Subcutaneous TID WC  . pantoprazole  40 mg Oral BID AC  . saccharomyces boulardii  250 mg Oral BID  . sodium chloride  10-40 mL Intracatheter Q12H  . 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 Any transcriptional errors are unintentional.   Pamella Pert, MD [facility] Pager [phone]. If 7 PM - 7 AM, please contact night-coverage at [email or web address], password [facility] 10/04/2013, 12:51 PM  LOS: 16 days

## 2013-10-04 NOTE — Progress Notes (Signed)
Patient has been non-responsive today. Resting calm, resp 12-16 p/min. Spoke with wife at bedside. Wife expressed acceptance and gratitude for current care.  Notified md, requsting to stop medications/diet due to pts condition.

## 2013-10-05 LAB — CBC
HEMATOCRIT: 32.7 % — AB (ref 39.0–52.0)
Hemoglobin: 10 g/dL — ABNORMAL LOW (ref 13.0–17.0)
MCH: 28.8 pg (ref 26.0–34.0)
MCHC: 30.6 g/dL (ref 30.0–36.0)
MCV: 94.2 fL (ref 78.0–100.0)
Platelets: 78 10*3/uL — ABNORMAL LOW (ref 150–400)
RBC: 3.47 MIL/uL — ABNORMAL LOW (ref 4.22–5.81)
RDW: 21.4 % — AB (ref 11.5–15.5)
WBC: 9.8 10*3/uL (ref 4.0–10.5)

## 2013-10-05 LAB — BASIC METABOLIC PANEL
BUN: 161 mg/dL — AB (ref 6–23)
CALCIUM: 7.1 mg/dL — AB (ref 8.4–10.5)
CO2: 33 mEq/L — ABNORMAL HIGH (ref 19–32)
CREATININE: 4.77 mg/dL — AB (ref 0.50–1.35)
Chloride: 97 mEq/L (ref 96–112)
GFR, EST AFRICAN AMERICAN: 12 mL/min — AB (ref >90)
GFR, EST NON AFRICAN AMERICAN: 10 mL/min — AB (ref >90)
Glucose, Bld: 173 mg/dL — ABNORMAL HIGH (ref 70–99)
Potassium: 6.4 mEq/L — ABNORMAL HIGH (ref 3.7–5.3)
Sodium: 143 mEq/L (ref 137–147)

## 2013-10-05 NOTE — Progress Notes (Signed)
UR completed. Kimyetta Flott RN CCM Case Mgmt phone [phone] 

## 2013-10-05 NOTE — Progress Notes (Signed)
PROGRESS NOTE  Frederick [name] ZOX:[id number] DOB: 12/11/1931 DOA: 06-06-2014 PCP: Arvella NighADCOCK, JIMMIE, MD  Assessment / Plan: Goals of care - patient is with multi organ failure as below, oliguric, hyperkalemic and with minimal PO intake. Palliative care has been consulted and has followed patient while hospitalized. Discussed again with POA Mrs. Jennette on 2/28 about patient's condition. She still wishes to take him home, however she wants to continue medical care. Medically, he cannot be discharged. He could be discharged from the hospital if the POA decides for full comfort, however she does not wish this, because this would be equivalent to hospice for her.  - talked to biological daughter today regarding my recommendation for GIP. She is in agreement and will try and discuss with POA. Have kindly asked palliative to talk again with Mrs. Manson PasseyBrown. I did discuss with her twice already on 2/27 and 3/1 and she is refusing hospice.  Hyperkalemia - increasing K due to oliguric renal failure. Not a dialysis candidate. Unable to take Kayexalate.  - did not respond to Lasix 2/27 and 2/28. Minimal increase in UOP, worsening renal function.  Oliguric/ATN-potentially postobstructive component  - Potentially secondary to IV contrast secondary to CT angiogram, nephrology has been consulted 2/21 and have signed off-recommended IV albumin/IV Lasix one-time dose-unfortunately cannot aggressively diurese given hypotension. Patient underwent a renal ultrasound on 2/20 which showed medical renal disease without evidence hydronephrosis  Hypoglycemia - intermittent. Paroxysmal SVT, heart rate 160 - Likely secondary to beta blocker withdrawal. He had a Troponin leak likely secondary to demand ischemia. EKG showed T wave inversions, however repeat EKG showed resolution 2/22. His SVT improved with Carotid sinus massage 2/24. Cardiology has been consulted, and evaluate the patient while hospitalized. No further events on the  telemetry. Acute hypoxic respiratory failure due to HCAP (healthcare-associated pneumonia) vs. atelectasis. CTA negative for PE. BCx NGTD, Flu neg, S. pneumo neg, Legionella neg. Antibiotics stop date 09/27/13=10 days for Hcap Chronic systolic heart failure with severe NICM EF 15-20% s/p pacemaker for bradycardia-Anasarca  Recent admission for heart failure. Discharge weight of 126-lbs and currently becoming increasingly fluid overload view to renal failure. Blood leaking from urethra likely due to I/O cath trauma, - Developed recurrent GU bleed after palcing foley for assesment renal/vol status. S./P. cystoscopy, placement of Foley over wire. Would keep in for comfort  Hypertension, at risk for hypotension - D/c BB, on amio started per Cardiology  T2DM, hypoglycemic A1c 9.2 last month - SSI sensitive  Bilateral hip and knee pain, worse than baseline - X-rays of hip and knee: No fracture  Left arm basilic vein thrombus - Wife declines a/c b/c she feels it caused the large bruise of his right side  Deconditioning due to heart failure, recurrent infections, severe protein calorie malnutrition  Multiple pressure ulcers, feet, sacrum, back - Air mattress  Severe protein calorie malnutrition - currently unable to take any by mouth intake Leukocytosis Normocytic anemia, recently received blood transfusion [place] [facility], occult stool positive. Patient is not a good candidate for surgery or colonoscopy - Iron studies consistent with anemia of chronic disease but given occult positive stool, may be mixed picture. Feraheme x 1 b12 1004 folate wnl, TSH 0.834. Occult stool positive. S/p 2 units PRBC 09/20/13, stable since Thrombocytopenia Hypokalemia, now hyperkalemic due to renal failure  Hypothermia due to severe heart failure - Warming blanket if needed  Thrush: Continue oral fluconazole once daily, stop 2/26  Diet: Dysphagia  Fluids: NS DVT Prophylaxis: SCD  Code Status: DNR Family Communication:  daughter and son Disposition Plan: inpatient  Consultants:  Palliative  Nephrology  Cardiology   Procedures:  none   Antibiotics Vancomycin 2/15-2/18 Zosyn x 1 2/15 Cefepime 2/15 >> 2/18  Levaquin 2/18>>>2/21  Fluconazole 2/16>>>2/26  HPI/Subjective: - Unresponsive  Objective: Filed Vitals:   10/05/13 0125 10/05/13 0300 10/05/13 0535 10/05/13 0600  BP:   112/50   Pulse: 60 60 60 60  Temp:   97.5 F (36.4 C)   TempSrc:   Oral   Resp: 14 14 12    Height:      Weight:      SpO2: 99% 99% 100% 99%    Intake/Output Summary (Last 24 hours) at 10/05/13 1114 Last data filed at 10/05/13 1010  Gross per 24 hour  Intake      0 ml  Output    175 ml  Net   -175 ml   Filed Weights   09/30/13 0700 10/01/13 0621 10/02/13 0456  Weight: 66.1 kg (145 lb 11.6 oz) 65.2 kg (143 lb 11.8 oz) 66 kg (145 lb 8.1 oz)   Exam:  General:  NAD, cachectic   Cardiovascular: regular rate and rhythm, without MRG  Respiratory: coarse breath sounds  Abdomen: soft, positive bowel sounds  MSK: 2+ peripheral edema upper and lower extremities  Data Reviewed: Basic Metabolic Panel:  Recent Labs Lab 10/01/13 0435 10/02/13 1050 10/03/13 0423 10/04/13 0457 10/05/13 0545  NA 142 144 143 146 143  K 5.3 5.5* 5.7* 6.4* 6.4*  CL 96 97 96 100 97  CO2 35* 34* 35* 34* 33*  GLUCOSE 97 94 125* 160* 173*  BUN 132* 138* 143* 145* 161*  CREATININE 3.88* 4.14* 4.32* 4.68* 4.77*  CALCIUM 7.1* 7.4* 7.4* 7.3* 7.1*   CBC:  Recent Labs Lab 10/01/13 0435 10/02/13 0550 10/03/13 0423 10/04/13 0457 10/05/13 0545  WBC 10.8* 11.0* 10.9* 12.1* 9.8  HGB 9.6* 10.7* 9.9* 10.1* 10.0*  HCT 30.8* 33.9* 32.5* 34.6* 32.7*  MCV 91.1 91.6 94.8 96.9 94.2  PLT 77* 84* 90* 88* 78*   BNP (last 3 results)  Recent Labs  08/06/13 1121 08/12/13 1649 09/19/13 0100  PROBNP >70000.0* 61891.0* >70000.0*   CBG:  Recent Labs Lab 10/02/13 2140 10/03/13 0727 10/03/13 1154 10/03/13 1824 10/03/13 2105    GLUCAP 111* 124* 129* 141* 155*   Scheduled Meds: . calcium gluconate  1 g Intravenous Once  . insulin aspart  0-9 Units Subcutaneous TID WC  . 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 Any transcriptional errors are unintentional.   Pamella Pert, MD [facility] Pager [phone]. If 7 PM - 7 AM, please contact night-coverage at [email or web address], password [facility] 10/05/2013, 11:14 AM  LOS: 17 days

## 2013-10-05 NOTE — Progress Notes (Signed)
10/05/2013 1600 NCM had extensive conversation with pt's wife, and dtr, Rozanna Box about Hospice, GIP and home with Hospice. Wife is not in agreement with any forms of hospice at this time. Dtr. Rozanna Box did understand EOL care and states she will discuss with her mother and get back with NCM on a final decision on 10/06/2013. Wife is agreement with receiving a Medicare IM on 10/06/2013.  Isidoro Donning RN CCM Case Mgmt phone [phone]

## 2013-10-05 NOTE — Progress Notes (Signed)
Palliative Medicine Team Progress Note  Brief check in on patient for symptom management issues, no family at bedside. Events noted related to dyspnea- family previously refusing comfort measures and Hospice care. He is actively dying and unstable for transport out of the hospital. Anticipate hospital death. We will continue to follow. Roxanol prn for dyspnea has been helpful. We will follow at a distance and re-engage if desired by his wife-please call [phone] if there are urgent symptom management needs or the goals of care change.  Anderson Malta, DO Palliative Medicine

## 2013-10-05 NOTE — Progress Notes (Signed)
Patient [name]      DOB: [birth year]      TAV:[id number]   Discussed case with Care Manager.  Family continues to decline hospice services which would encompass GIP.  I will not re approach this family unless they desire the interaction.  Patient dying but is comfortable. Will shadow with you.   Darrah Dredge L. Ladona Ridgel, MD MBA The Palliative Medicine Team at [facility] Phone: [phone] Pager: [phone]

## 2013-10-06 DIAGNOSIS — Z66 Do not resuscitate: Secondary | ICD-10-CM

## 2013-10-06 DIAGNOSIS — I5043 Acute on chronic combined systolic (congestive) and diastolic (congestive) heart failure: Secondary | ICD-10-CM

## 2013-10-06 DIAGNOSIS — R197 Diarrhea, unspecified: Secondary | ICD-10-CM

## 2013-10-06 DIAGNOSIS — R1310 Dysphagia, unspecified: Secondary | ICD-10-CM

## 2013-10-06 DIAGNOSIS — D638 Anemia in other chronic diseases classified elsewhere: Secondary | ICD-10-CM

## 2013-10-06 LAB — GLUCOSE, CAPILLARY
GLUCOSE-CAPILLARY: 138 mg/dL — AB (ref 70–99)
GLUCOSE-CAPILLARY: 112 mg/dL — AB (ref 70–99)
GLUCOSE-CAPILLARY: 131 mg/dL — AB (ref 70–99)
Glucose-Capillary: 82 mg/dL (ref 70–99)

## 2013-10-06 LAB — CBC
HCT: 34.3 % — ABNORMAL LOW (ref 39.0–52.0)
Hemoglobin: 10.6 g/dL — ABNORMAL LOW (ref 13.0–17.0)
MCH: 28.6 pg (ref 26.0–34.0)
MCHC: 30.9 g/dL (ref 30.0–36.0)
MCV: 92.7 fL (ref 78.0–100.0)
PLATELETS: 84 10*3/uL — AB (ref 150–400)
RBC: 3.7 MIL/uL — ABNORMAL LOW (ref 4.22–5.81)
RDW: 21.3 % — AB (ref 11.5–15.5)
WBC: 10.3 10*3/uL (ref 4.0–10.5)

## 2013-10-06 NOTE — Progress Notes (Signed)
CARE MANAGEMENT NOTE 10/06/2013  Patient:  Frederick [name],Frederick [name]   Account Number:  [id number]  Date Initiated:  09/20/2013  Documentation initiated by:  DAVIS,RHONDA  Subjective/Objective Assessment:   hcap     Action/Plan:   from home   Anticipated DC Date:  10/06/2013   Anticipated DC Plan:  HOME W HOSPICE CARE  In-house referral  NA      DC Planning Services  CM consult      PAC Choice  NA   Choice offered to / List presented to:  NA   DME arranged  NA      DME agency  NA  NA     HH arranged  NA      HH agency  NA   Status of service:  Completed, signed off Medicare Important Message given?  NA - LOS <3 / Initial given by admissions (If response is "NO", the following Medicare IM given date fields will be blank) Date Medicare IM given:   Date Additional Medicare IM given:  10/06/2013  Discharge Disposition:    Per UR Regulation:  Reviewed for med. necessity/level of care/duration of stay  If discussed at Long Length of Stay Meetings, dates discussed:    Comments:  10/06/2013 1745 Contacted [facility] to initiate referral to GIP. Frederick [name] Frederick Kropp RN CCM Case Mgmt phone [phone]  10/06/2013 1630 NCM spoke to wife and grand-daughter in room. Grand-daughter reviewed with pt's wife Medicare IM. She did sign. Wife states she would be interested in GIP or taking him home. States she has hospital bed and oxygen at home. Will speak with speak with [facility] for GIP. NCM spoke with attending and if not accepted by GIP, dc home with care from family. Frederick [name] Frederick Riva RN CCM Case Mgmt phone [phone]  10/05/2013 1530 NCM made outreach to dtr, Frederick [name]. States her mom plans visit to hospital today. Waiting arrival of wife. Frederick [name] Frederick Kazmi RN CCM Case Mgmt phone [phone]  10/05/2013 1600 NCM had extensive conversation with pt's wife, and dtr, Frederick [name] about Hospice, GIP and home with Hospice. Wife is not in agreement with any forms of hospice at this time. Dtr. Frederick [name]  did understand EOL care and states she will discuss with her mother and get back with NCM on a final decision on 10/06/2013. Wife is agreement with receiving a Medicare IM on 10/06/2013.  Frederick [name] Frederick Halderman RN CCM Case Mgmt phone [phone]   10/01/13 MMCGIBBONEY, RN, BSN Pt's wife at bedside with daughter Frederick [name], daughter Frederick [name] on the telephone. Pt's wife want to take pt home with 24/7 care. Pt's wife do not want Hospice or SNF. Daughter Frederick [name] states they are hiring private duty care 24/7 for pt. Wife given a list for Private Duty Care Agencies and Home Health Agencies List.  Pt's wife and daughter continued to ask question, wife turn things around that is said. Wife states "You said I was not component and could not afford to care for Frederick [name] my husband."  This was not what was said at all to the pt's wife.  Pt's wife thinks spouse is going to live and he can make it with 24hr care. A call was made to Social Worker Frederick [name], concerning hours of services that. Pt and wife have 6hrs together from [facility]. Ms Frederick [name] states that it is an unsafe for pt to go home. Pt's wife could not understand that it is not safe to take pt home. Wife continue to say no to Hospice and SNF.  1130 am Conversation with Dr. Seward [name] revealed that pt's wife gave permission to talk with pt's biological children, gave her the telephone to Frederick [name] pt's daughter. [place] [name] called to say she was coming up from [place]. Daughter Frederick [name] [phone], pt's biological daughter, called states she is coming to visit pt. She stated that Dr. Seward [name] had called her. Frederick [name] and Dr. Seward [name] talked with Frederick [name] and sister Frederick [name] on cell phone concerning pt's status.  09/29/13 MMcGibboney, RN, BSN A call to Frederick [name] was made to encourage her to come visit pt with a NA to care for her needs.This is related to pt not being able to care for herself in ADL's/she will need someone with her to care for her needs. [facility], Social  Worker Frederick [name], with pt at present time, states she will assist pt with understanding that someone needs to be with her when she comes to visit pt.  09/22/13 MMCGIBBONEY, RN, BSN Pt's wife called CM office today .  This CM returned her [phone]).  Pt's wife has not given a straight answer of where the pt will live or who would help her with pt's care at discharge. Spoke with pt's son Frederick [name] to asked where pt will live. There are no concrete answers to where pt will discharge to/live or who will assist with pt's care.  This CM asked son and wife to talk about disposition together, call me back with that information. During the pt's last hospitalization 08/17/13, the wife states, she and the pt were moving to [place] area, no address, however they stayed with their son Frederick [name] in [place], [place].  Pt's wife, on last hospitalization 08/17/13 selected [facility].  A call to [name] Furbish [phone]) on today 09/22/13 revealed that case was not opened related to pt not having a PCP and wife not allowing [facility] to come in to assess the pt.  [name] Furbish emailed pt's son Frederick [name],  information on how to established a PCP, names of PCP's taking new pt's in the [place] area, along with 2  Physician practices that made home visits list. CM will continue to follow pt for discharge needs.    [id number] Frederick Plater, RN, BSN, CCM [phone] Chart Reviewed for discharge and hospital needs. Discharge needs at time of review:  None present will follow for needs. Review of patient progress due on [id number]. will discuss need for aides around the clock once patient is stablized/insurance may not cover.

## 2013-10-06 NOTE — Plan of Care (Signed)
Problem: Phase II Progression Outcomes Goal: Fluid volume status improved Outcome: Not Met (add Reason) Pt has multiple organ failure Goal: Begin discharge teaching Outcome: Not Met (add Reason) Pt has multiple organ failure & is on palliative care

## 2013-10-06 NOTE — Progress Notes (Signed)
CSW attempting to contact DSS social worker, Baxter Hire [phone]. Message has been left requesting a return as soon as possible. Previous notes indicate that DSS has been involved with this pt/family. CSW would like to update worker regarding d/c plans.   Cori Razor LCSW [phone]

## 2013-10-06 NOTE — Progress Notes (Signed)
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 Palliative care has been consulted and has followed patient while hospitalized. Dr. Lafe Garin Discussed again with POA Mrs. Julius on 2/28 about patient's condition.   Hyperkalemia - increasing K due to oliguric renal failure. Not a dialysis candidate. Unable to take Kayexalate.  - did not respond to Lasix 2/27 and 2/28. Minimal increase in UOP, worsening renal function.  Oliguric/ATN-potentially postobstructive component  - Potentially secondary to IV contrast secondary to CT angiogram, nephrology has been consulted 2/21 and have signed off-recommended IV albumin/IV Lasix one-time dose-unfortunately cannot aggressively diurese given hypotension. Patient underwent a renal ultrasound on 2/20 which showed medical renal disease without evidence hydronephrosis  Hypoglycemia - intermittent.  Paroxysmal SVT, heart rate 160 - Likely secondary to beta blocker withdrawal. He had a Troponin leak likely secondary to demand ischemia. EKG showed T wave inversions, however repeat EKG showed resolution 2/22. His SVT improved with Carotid sinus massage 2/24. Cardiology has been consulted, and evaluate the patient while hospitalized. No further events on the telemetry.  Acute hypoxic respiratory failure due to HCAP (healthcare-associated  pneumonia) vs. atelectasis. CTA negative for PE. BCx NGTD, Flu neg, S. pneumo neg, Legionella neg. Antibiotics stop date 09/27/13=10 days for Hcap  Chronic systolic heart failure with severe NICM EF 15-20% s/p pacemaker for bradycardia-Anasarca  Recent admission for heart failure. Discharge weight of 126-lbs and currently becoming increasingly fluid overload view to renal failure.  Blood leaking from urethra likely due to I/O cath trauma, - Developed recurrent GU bleed after palcing foley for assesment renal/vol status. S./P. cystoscopy, placement of Foley over wire. Would keep in for comfort  Hypertension, at risk for hypotension - D/c BB, on amio started per Cardiology  T2DM, hypoglycemic A1c 9.2 last month - SSI sensitive  Bilateral hip and knee pain, worse than baseline - X-rays of hip and knee: No fracture  Left arm basilic vein thrombus - Wife declines a/c b/c she feels it caused the large bruise of his right side  Deconditioning due to heart failure, recurrent infections, severe protein calorie malnutrition  Multiple pressure ulcers, feet, sacrum, back - Air mattress  Severe protein calorie malnutrition - currently unable to take any by mouth intake  Leukocytosis  Normocytic anemia, recently received blood transfusion of 2 units from [place], occult stool positive. Patient is not a good candidate for surgery or colonoscopy - Iron studies consistent with anemia of chronic disease but given occult positive stool, may be mixed picture. Feraheme x 1 b12 1004 folate wnl, TSH 0.834. Occult stool positive. S/p 2 units PRBC 09/20/13, stable since  Thrombocytopenia  Hypokalemia, now hyperkalemic due to renal failure  Hypothermia due to severe heart failure - Warming blanket if needed  Thrush: Continue oral fluconazole once daily, stop 2/26   Diet: Dysphagia  Fluids: NS  DVT Prophylaxis: SCD  Code Status: DNR  Family Communication: no family at the bedside; will contact  later  Disposition Plan:  inpatient  Consultants:  Palliative  Nephrology  Cardiology  Procedures:  none Antibiotics Vancomycin 2/15-2/18  Zosyn x 1 2/15  Cefepime 2/15 >> 2/18  Levaquin 2/18>>>2/21  Fluconazole 2/16>>>2/26  HPI/Subjective:  - Unresponsive  Objective: Filed Vitals:   10/06/13 0535  BP: 147/85  Pulse: 87  Temp: 96.4 F (35.8 C)  Resp: 12    Intake/Output Summary (Last 24 hours) at 10/06/13 1219 Last data filed at 10/06/13 0900  Gross per 24 hour  Intake      0 ml  Output     60 ml  Net    -60 ml   Filed Weights   09/30/13 0700 10/01/13 0621 10/02/13 0456  Weight: 66.1 kg (145 lb 11.6 oz) 65.2 kg (143 lb 11.8 oz) 66 kg (145 lb 8.1 oz)    Exam:   General:  Unresponsive   Cardiovascular: s1,s2 rrr  Respiratory: bl rales   Abdomen: soft, nt, nd   Musculoskeletal: edema    Data Reviewed: Basic Metabolic Panel:  Recent Labs Lab 10/01/13 0435 10/02/13 1050 10/03/13 0423 10/04/13 0457 10/05/13 0545  NA 142 144 143 146 143  K 5.3 5.5* 5.7* 6.4* 6.4*  CL 96 97 96 100 97  CO2 35* 34* 35* 34* 33*  GLUCOSE 97 94 125* 160* 173*  BUN 132* 138* 143* 145* 161*  CREATININE 3.88* 4.14* 4.32* 4.68* 4.77*  CALCIUM 7.1* 7.4* 7.4* 7.3* 7.1*   Liver Function Tests: No results found for this basename: AST, ALT, ALKPHOS, BILITOT, PROT, ALBUMIN,  in the last 168 hours No results found for this basename: LIPASE, AMYLASE,  in the last 168 hours No results found for this basename: AMMONIA,  in the last 168 hours CBC:  Recent Labs Lab 10/02/13 0550 10/03/13 0423 10/04/13 0457 10/05/13 0545 10/06/13 0430  WBC 11.0* 10.9* 12.1* 9.8 10.3  HGB 10.7* 9.9* 10.1* 10.0* 10.6*  HCT 33.9* 32.5* 34.6* 32.7* 34.3*  MCV 91.6 94.8 96.9 94.2 92.7  PLT 84* 90* 88* 78* 84*   Cardiac Enzymes: No results found for this basename: CKTOTAL, CKMB, CKMBINDEX, TROPONINI,  in the last 168 hours BNP (last 3 results)  Recent Labs  08/06/13 1121 08/12/13 1649 09/19/13 0100  PROBNP  >70000.0* 61891.0* >70000.0*   CBG:  Recent Labs Lab 10/03/13 0727 10/03/13 1154 10/03/13 1824 10/03/13 2105 10/06/13 0812  GLUCAP 124* 129* 141* 155* 138*    No results found for this or any previous visit (from the past 240 hour(s)).   Studies: No results found.  Scheduled Meds: . calcium gluconate  1 g Intravenous Once  . insulin aspart  0-9 Units Subcutaneous TID WC  . 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 If 7PM-7AM, please contact night-coverage at [email or web address], password [email or web address] 10/06/2013, 12:19 PM  LOS: 18 days

## 2013-10-07 DIAGNOSIS — R079 Chest pain, unspecified: Secondary | ICD-10-CM

## 2013-10-07 LAB — CBC
HCT: 35.7 % — ABNORMAL LOW (ref 39.0–52.0)
Hemoglobin: 11 g/dL — ABNORMAL LOW (ref 13.0–17.0)
MCH: 28.3 pg (ref 26.0–34.0)
MCHC: 30.8 g/dL (ref 30.0–36.0)
MCV: 91.8 fL (ref 78.0–100.0)
Platelets: 67 10*3/uL — ABNORMAL LOW (ref 150–400)
RBC: 3.89 MIL/uL — ABNORMAL LOW (ref 4.22–5.81)
RDW: 21.2 % — ABNORMAL HIGH (ref 11.5–15.5)
WBC: 19.5 10*3/uL — ABNORMAL HIGH (ref 4.0–10.5)

## 2013-10-07 LAB — GLUCOSE, CAPILLARY: Glucose-Capillary: 25 mg/dL — CL (ref 70–99)

## 2013-10-07 MED ORDER — MORPHINE SULFATE 2 MG/ML IJ SOLN
2.0000 mg | INTRAMUSCULAR | Status: DC | PRN
  Administered 2013-10-07: 2 mg via INTRAVENOUS
  Filled 2013-10-07: qty 1

## 2013-10-08 NOTE — Progress Notes (Signed)
Discharge summary sent to payer through [facility]  

## 2013-11-03 NOTE — Progress Notes (Signed)
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 Palliative care has been consulted and has followed patient while hospitalized. Dr. Lafe Garin Discussed again with POA Mrs. Kreitzer on 2/28 about patient's condition.   Hyperkalemia - increasing K due to oliguric renal failure. Not a dialysis candidate. Unable to take Kayexalate.  - did not respond to Lasix 2/27 and 2/28. Minimal increase in UOP, worsening renal function.  Oliguric/ATN-potentially postobstructive component  - Potentially secondary to IV contrast secondary to CT angiogram, nephrology has been consulted 2/21 and have signed off-recommended IV albumin/IV Lasix one-time dose-unfortunately cannot aggressively diurese given hypotension. Patient underwent a renal ultrasound on 2/20 which showed medical renal disease without evidence hydronephrosis  Hypoglycemia - intermittent.  Paroxysmal SVT, heart rate 160 - Likely secondary to beta blocker withdrawal. He had a Troponin leak likely secondary to demand ischemia. EKG showed T wave inversions, however repeat EKG showed resolution 2/22. His SVT improved with Carotid sinus massage 2/24. Cardiology has been consulted, and evaluate the patient while hospitalized. No further events on the telemetry.  Acute  hypoxic respiratory failure due to HCAP (healthcare-associated pneumonia) vs. atelectasis. CTA negative for PE. BCx NGTD, Flu neg, S. pneumo neg, Legionella neg. Antibiotics stop date 09/27/13=10 days for Hcap  Chronic systolic heart failure with severe NICM EF 15-20% s/p pacemaker for bradycardia-Anasarca  Recent admission for heart failure. Discharge weight of 126-lbs and currently becoming increasingly fluid overload view to renal failure.  Blood leaking from urethra likely due to I/O cath trauma, - Developed recurrent GU bleed after palcing foley for assesment renal/vol status. S./P. cystoscopy, placement of Foley over wire. Would keep in for comfort  Hypertension, at risk for hypotension - D/c BB, on amio started per Cardiology  T2DM, hypoglycemic A1c 9.2 last month - SSI sensitive  Bilateral hip and knee pain, worse than baseline - X-rays of hip and knee: No fracture  Left arm basilic vein thrombus - Wife declines a/c b/c she feels it caused the large bruise of his right side  Deconditioning due to heart failure, recurrent infections, severe protein calorie malnutrition  Multiple pressure ulcers, feet, sacrum, back - Air mattress  Severe protein calorie malnutrition - currently unable to take any by mouth intake  Leukocytosis  Normocytic anemia, recently received blood transfusion of 2 units from [place], occult stool positive. Patient is not a good candidate for surgery or colonoscopy - Iron studies consistent with anemia of chronic disease but given occult positive stool, may be mixed picture. Feraheme x 1 b12 1004 folate wnl, TSH 0.834. Occult stool positive. S/p 2 units PRBC 09/20/13, stable since  Thrombocytopenia  Hypokalemia, now hyperkalemic due to renal failure  Hypothermia due to severe heart failure - Warming blanket if needed  Thrush: Continue oral fluconazole once daily, stop 2/26   Diet: Dysphagia  Fluids: NS  DVT Prophylaxis: SCD  Code Status: DNR  Family Communication: no family  at the bedside; will contact later  Disposition Plan: inpatient  Consultants:  Palliative  Nephrology  Cardiology  Procedures:  none Antibiotics Vancomycin 2/15-2/18  Zosyn x 1 2/15  Cefepime 2/15 >> 2/18  Levaquin 2/18>>>2/21  Fluconazole 2/16>>>2/26  HPI/Subjective:  - Unresponsive  Objective: Filed Vitals:   10/30/2013 0509  BP: 121/66  Pulse: 65  Temp: 98.7 F (37.1 C)  Resp: 12    Intake/Output Summary (Last 24 hours) at 10/19/2013 1136 Last data filed at 10/25/2013 0509  Gross per 24 hour  Intake      0 ml  Output     10 ml  Net    -10 ml   Filed Weights   09/30/13 0700 10/01/13 0621 10/02/13 0456  Weight: 66.1 kg (145 lb 11.6 oz) 65.2 kg (143 lb 11.8 oz) 66 kg (145 lb 8.1 oz)    Exam:   General:  Unresponsive   Cardiovascular: s1,s2 rrr  Respiratory: bl rales   Abdomen: soft, nt, nd   Musculoskeletal: edema    Data Reviewed: Basic Metabolic Panel:  Recent Labs Lab 10/01/13 0435 10/02/13 1050 10/03/13 0423 10/04/13 0457 10/05/13 0545  NA 142 144 143 146 143  K 5.3 5.5* 5.7* 6.4* 6.4*  CL 96 97 96 100 97  CO2 35* 34* 35* 34* 33*  GLUCOSE 97 94 125* 160* 173*  BUN 132* 138* 143* 145* 161*  CREATININE 3.88* 4.14* 4.32* 4.68* 4.77*  CALCIUM 7.1* 7.4* 7.4* 7.3* 7.1*   Liver Function Tests: No results found for this basename: AST, ALT, ALKPHOS, BILITOT, PROT, ALBUMIN,  in the last 168 hours No results found for this basename: LIPASE, AMYLASE,  in the last 168 hours No results found for this basename: AMMONIA,  in the last 168 hours CBC:  Recent Labs Lab 10/03/13 0423 10/04/13 0457 10/05/13 0545 10/06/13 0430 10/17/2013 0500  WBC 10.9* 12.1* 9.8 10.3 19.5*  HGB 9.9* 10.1* 10.0* 10.6* 11.0*  HCT 32.5* 34.6* 32.7* 34.3* 35.7*  MCV 94.8 96.9 94.2 92.7 91.8  PLT 90* 88* 78* 84* 67*   Cardiac Enzymes: No results found for this basename: CKTOTAL, CKMB, CKMBINDEX, TROPONINI,  in the last 168 hours BNP (last 3 results)  Recent Labs   08/06/13 1121 08/12/13 1649 09/19/13 0100  PROBNP >70000.0* 61891.0* >70000.0*   CBG:  Recent Labs Lab 10/06/13 0812 10/06/13 1309 10/06/13 1703 10/06/13 2204 10/14/2013 0916  GLUCAP 138* 112* 131* 82 25*    No results found for this or any previous visit (from the past 240 hour(s)).   Studies: No results found.  Scheduled Meds: . calcium gluconate  1 g Intravenous Once  . 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 If 7PM-7AM, please contact night-coverage at [email or web address], password [email or web address] 10/28/2013, 11:36 AM  LOS: 19 days

## 2013-11-03 NOTE — Progress Notes (Signed)
NCM contacted wife, Kealoha Kurtzman to make her aware of passing of husband, Frederick [name]. States she would be able to come to hospital with assistance of her caregiver. Wife gave permission to contact the children. NCM contacted Ileene Hutchinson, dtr, Cardell Peach, dtr and call received from grand-daughter, Gehrig Schiess to make them aware. Isidoro Donning RN CCM Case Mgmt phone [phone]

## 2013-11-03 NOTE — Progress Notes (Signed)
[facility] PROGRESS NOTE  Nasif Len ZHG:[id number] DOB: 05-04-1932 DOA: 09/17/2013 PCP: Arvella Nigh, MD  Patient has no obtainable vital signs; no respirations, no heart sounds, no brain stem reflexes; He is pronounced dead on 10/25/13 at 11.45 AM  Esperanza Sheets  [facility] Pager [phone]. If 7PM-7AM, please contact night-coverage at [email or web address], password [facility] 2013/10/25, 11:49 AM  LOS: 19 days

## 2013-11-03 NOTE — Discharge Summary (Signed)
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 20-25%   HTN (hypertension)   COPD (chronic obstructive pulmonary disease)   ARF (acute renal failure)   Protein-calorie malnutrition, severe   Pleural effusion   Prolonged immobilization   Decubitus ulcer   Physical deconditioning   Anemia of chronic disease   Anemia, iron deficiency   DNR (do not resuscitate)   -patient is pronounced dead on Nov 04, 2013 at 11.45 AM    History of present illness:   80yo M with multiple medical problems including DM, HTN, COPD, h/o pacemaker placement due to bradycardia, and severe ICMP with an EF of 15-20% who has been admitted iniitially admitted early January with decompensated CHF, then readmitted soon afterward with HCAP, and most recently on 10/16/13 for FTT and the development of sacral decubiti and inability to care for himself  -progressive declined, under symptom management  -family accepted GIP; symptom control;  -patient is pronounced dead on 11/04/2013 at 11.45 AM   Hospital Course:  Goals of care  - patient is with multi organ failure as below, oliguric, hyperkalemic and with minimal PO intake. Palliative care has been consulted and has followed patient while hospitalized. Dr. Lafe Garin Discussed again with POA Mrs. Mesick on 2/28 about patient's condition. patient is DNR  Hyperkalemia - increasing K due to oliguric renal failure. Not a dialysis candidate. Unable to take Kayexalate.  - did not respond to Lasix 2/27 and 2/28. Minimal increase in UOP, worsening renal function.  Oliguric/ATN-potentially postobstructive component  - Potentially secondary to IV contrast secondary to  CT angiogram, nephrology has been consulted 2/21 and have signed off-recommended IV albumin/IV Lasix one-time dose-unfortunately cannot aggressively diurese given hypotension. Patient underwent a renal ultrasound on 2/20 which showed medical renal disease without evidence hydronephrosis  Hypoglycemia - intermittent.  Paroxysmal SVT, heart rate 160 - Likely secondary to beta blocker withdrawal. He had a Troponin leak likely secondary to demand ischemia. EKG showed T wave inversions, however repeat EKG showed resolution 2/22. His SVT improved with Carotid sinus massage 2/24. Cardiology has been consulted, and evaluate the patient while hospitalized. No further events on the telemetry.  Acute hypoxic respiratory failure due to HCAP (healthcare-associated pneumonia) vs. atelectasis. CTA negative for PE. BCx NGTD, Flu neg, S. pneumo neg, Legionella neg. Antibiotics stop date 09/27/13=10 days for Hcap  Chronic systolic heart failure with severe NICM EF 15-20% s/p pacemaker for bradycardia-Anasarca  Recent admission for heart failure. Discharge weight of 126-lbs and currently becoming increasingly fluid overload view to renal failure.  Blood leaking from urethra likely due to I/O cath trauma, - Developed recurrent GU bleed after palcing foley for assesment renal/vol status. S./P. cystoscopy, placement of Foley over wire. Would keep in for comfort  Hypertension, at risk for hypotension - D/c BB, on amio started per Cardiology  T2DM, hypoglycemic A1c 9.2 last month  Bilateral hip and knee pain, worse than baseline - X-rays of hip and knee: No fracture  Left arm basilic vein thrombus - Wife declines a/c b/c she feels it caused the large bruise of his right side  Deconditioning due to heart failure, recurrent infections, severe protein calorie malnutrition  Multiple pressure ulcers, feet, sacrum, back - Air mattress  Severe protein  calorie malnutrition - currently unable to take any by mouth intake  Leukocytosis   Normocytic anemia, recently received blood [place] of 2 [place] [facility], occult stool positive. Patient is not a good candidate for surgery or colonoscopy - Iron studies consistent with anemia of chronic disease but given occult positive stool, may be mixed picture. Feraheme x 1 b12 1004 folate wnl, TSH 0.834. Occult stool positive. S/p 2 units [facility] 09/20/13, stable since  Thrombocytopenia  Hypokalemia, now hyperkalemic due to renal failure  Hypothermia due to severe heart failure - Warming blanket if needed  Thrush: Continue oral fluconazole once daily, stop 2/26       Time: 11:54 AM   Signed:  Jonette MateBURIEV, Frederick Gahm N  [facility] 10/24/2013, 11:52 AM

## 2013-11-03 DEATH — deceased

## 2014-05-19 IMAGING — CT CT ANGIO CHEST
1 of 2 series · 19 of 32 positions shown · IV contrast (OMNIPAQUE 300)
Comparison: Chest CTA 05/25/2011.

CLINICAL DATA: 81-year-old male with shortness of breath, pleural
effusions. Pain. Initial encounter.

EXAM:
CT ANGIOGRAPHY CHEST WITH CONTRAST
TECHNIQUE: Multidetector CT imaging of the chest was performed using the
standard protocol during bolus administration of intravenous
contrast. Multiplanar CT image reconstructions and MIPs were
obtained to evaluate the vascular anatomy.
CONTRAST:  80mL OMNIPAQUE IOHEXOL 350 MG/ML SOLN

[Series 5: thins for pacs · axial · 0.66mm/px · z∈[-381,-70]mm · 19 of 341 slices shown]
[im 15/341  lung]
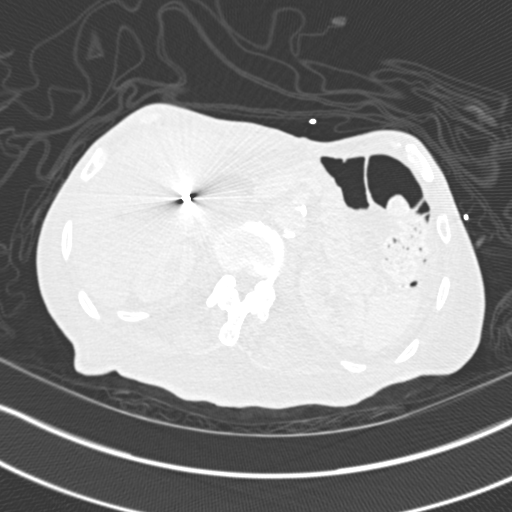
[im 30/341  soft-tissue]
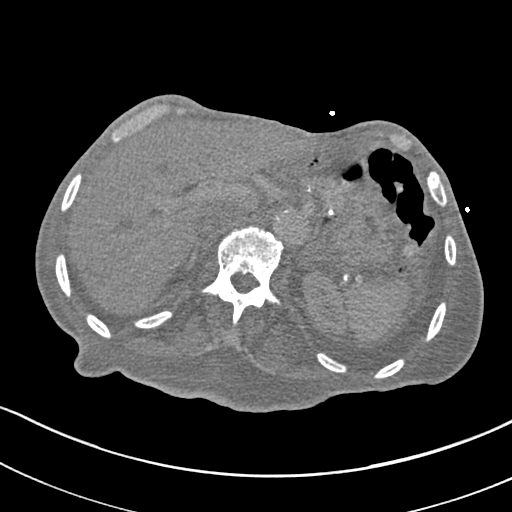
[im 45/341  lung]
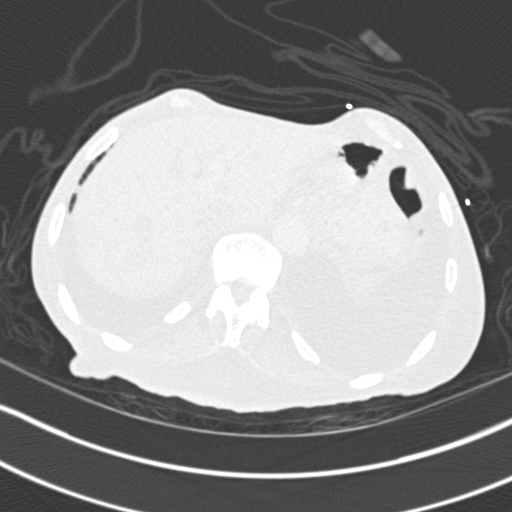
[im 74/341  soft-tissue]
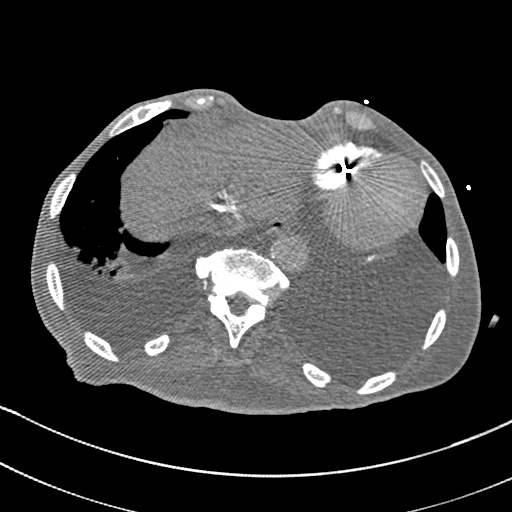
[im 89/341  lung]
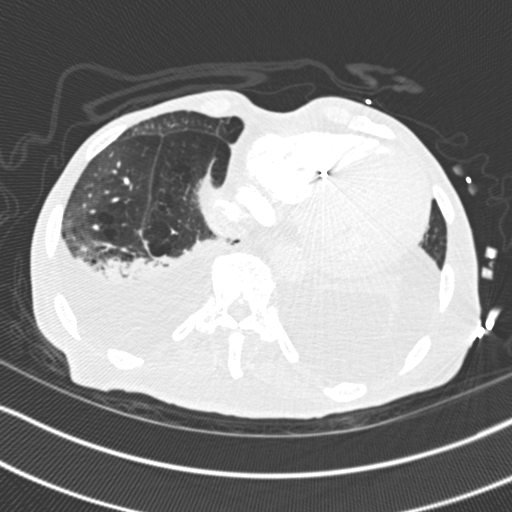
[im 104/341  soft-tissue]
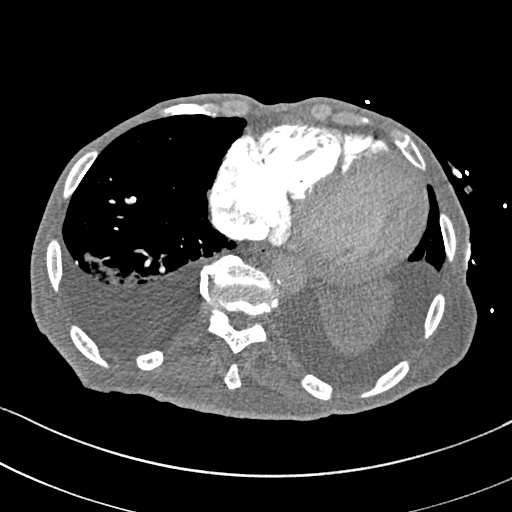
[im 119/341  lung]
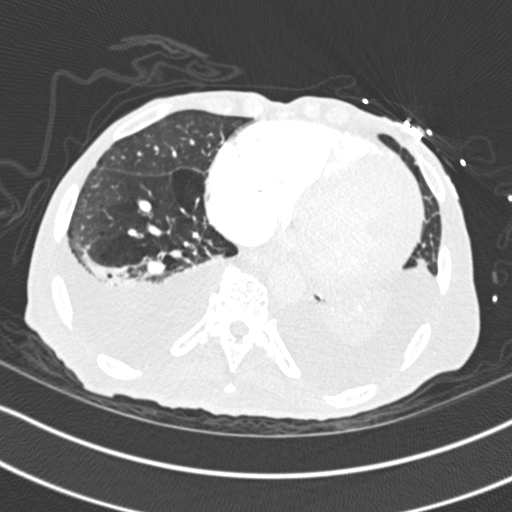
[im 134/341  soft-tissue]
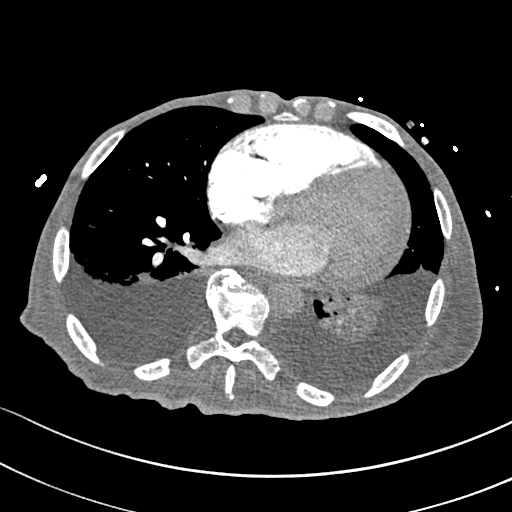
[im 148/341  lung]
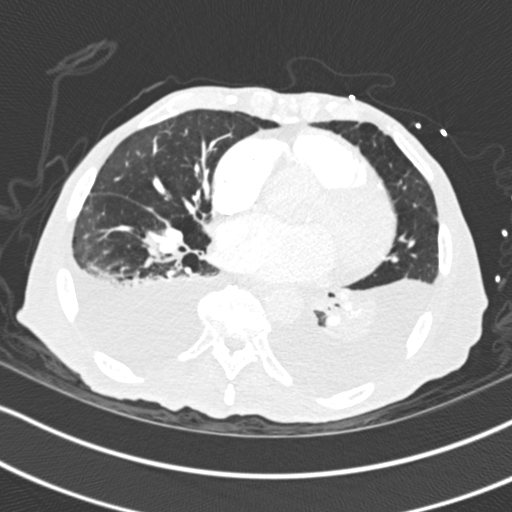
[im 178/341  soft-tissue]
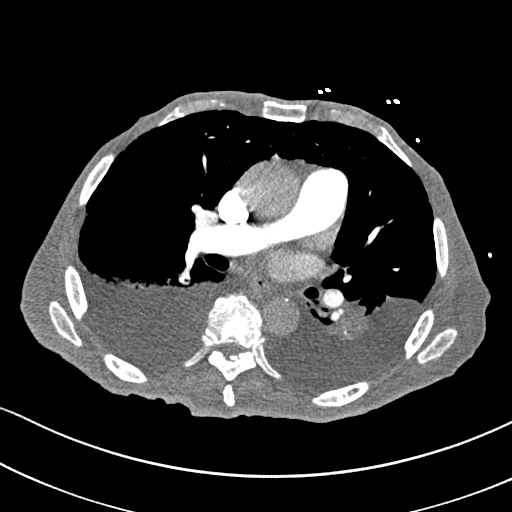
[im 193/341  lung]
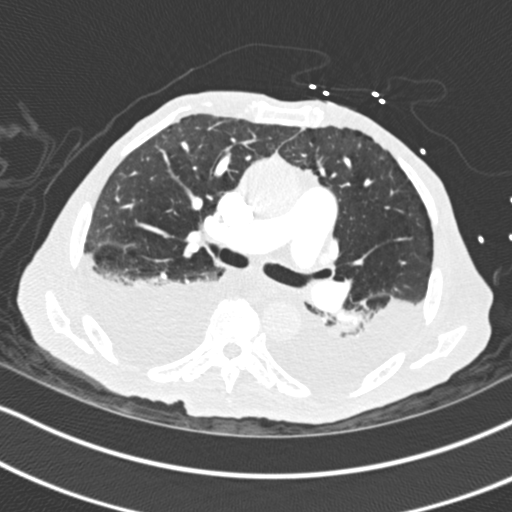
[im 207/341  soft-tissue]
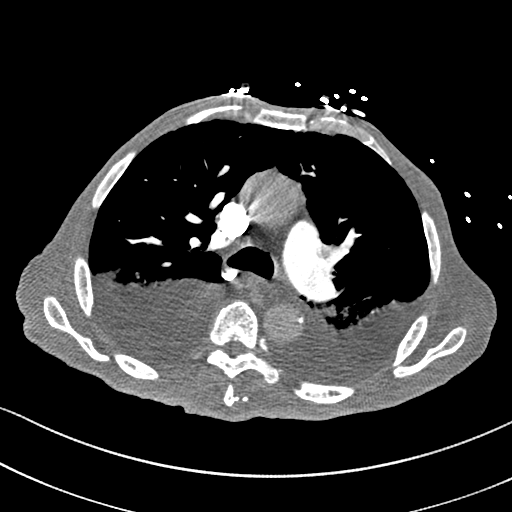
[im 222/341  lung]
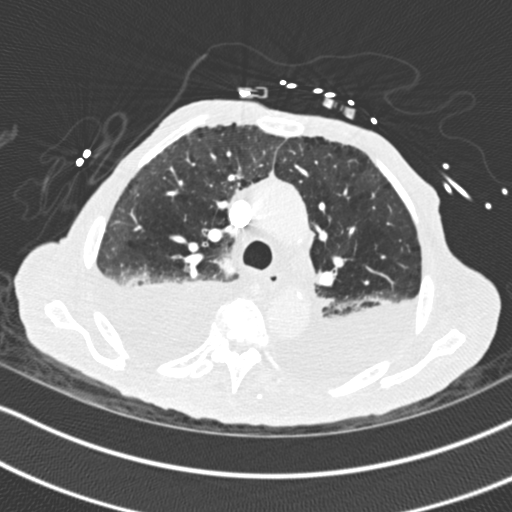
[im 237/341  soft-tissue]
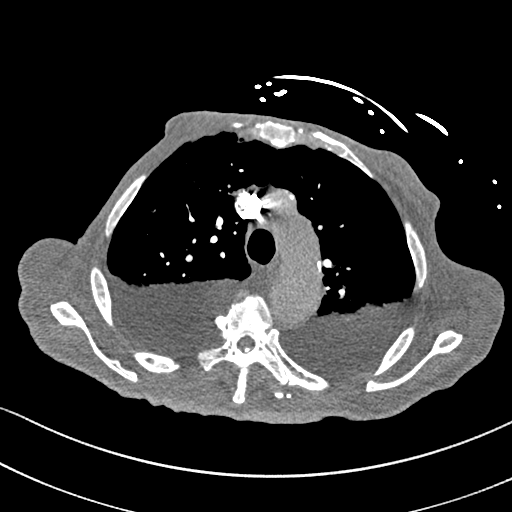
[im 252/341  lung]
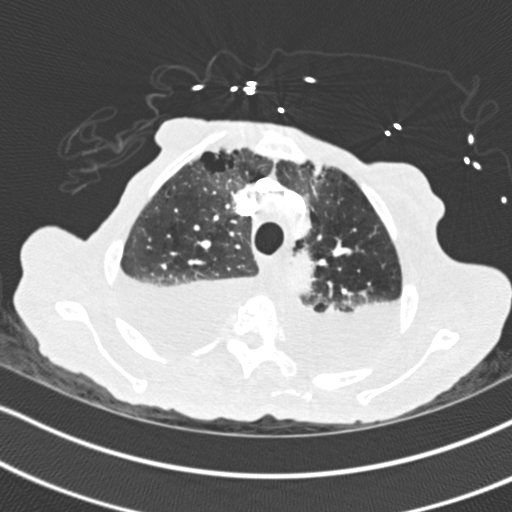
[im 267/341  soft-tissue]
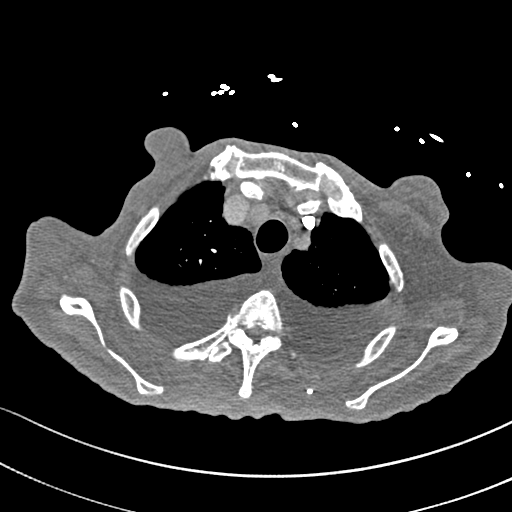
[im 296/341  lung]
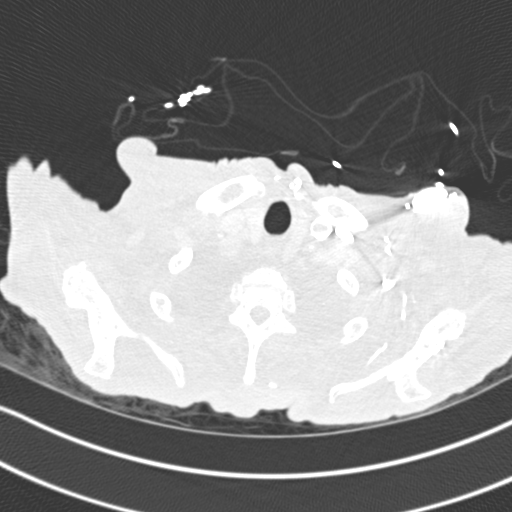
[im 311/341  soft-tissue]
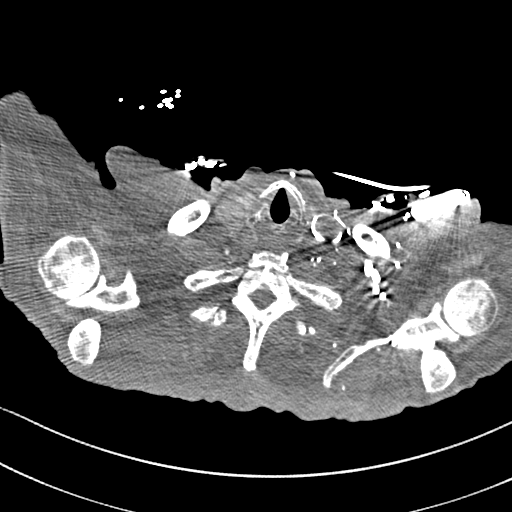
[im 326/341  lung]
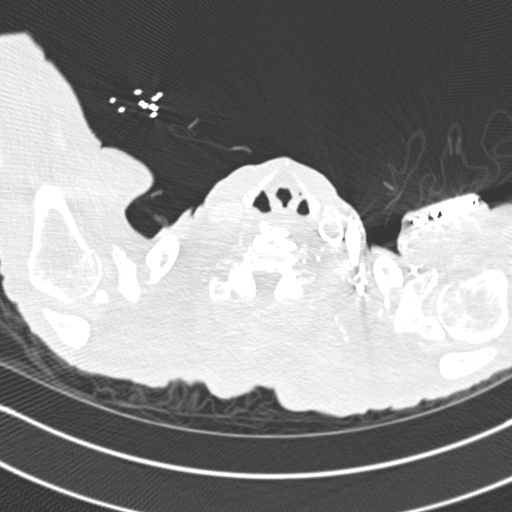

[19 of 32 positions shown; findings below may reference images not displayed]

FINDINGS: Good contrast bolus timing in the pulmonary arterial tree. Some
mixing artifact is noted in the distal left main pulmonary artery.
This does not appear confluent or suspicious for embolus. Loss of
lower lobe pulmonary artery opacification occurs in a gradient like
fashion. No abrupt or suspicious pulmonary artery filling defect
identified.

Large layering bilateral pleural effusions. Simple fluid
densitometry compatible with transudate. No abnormal pleural
thickening or enhancement.

Cardiomegaly. No pericardial effusion. Left chest cardiac pacemaker
device. No mediastinal lymphadenopathy. Calcified atherosclerosis of
the aorta and coronary arteries.

Stable and negative visualized upper abdominal viscera, with
additional calcified atherosclerosis.

Major airways are patent. There is left worse than right compressive
lower lobe collapse related to the effusions. There is superimposed
dependent upper lobe atelectasis. No evidence of superimposed
pneumonia at this time. Some chronic architectural distortion in the
anterior right upper lobe is stable.

Stable visualized osseous structures.

Review of the MIP images confirms the above findings.
IMPRESSION: 1. No strong evidence of acute pulmonary embolus.
2. Large layering and simple appearing bilateral pleural effusions
with near complete lower lobe collapse, greater on the left, and
upper lobe compressive atelectasis.

## 2014-05-19 IMAGING — CR DG CHEST 1V PORT
1 series · 1 of 1 positions shown · non-contrast
Comparison: CT chest 09/19/2013 at 2777 hr and PA and lateral chest
09/18/2013.

CLINICAL DATA: Line placement.

EXAM:
PORTABLE CHEST - 1 VIEW

[AP]
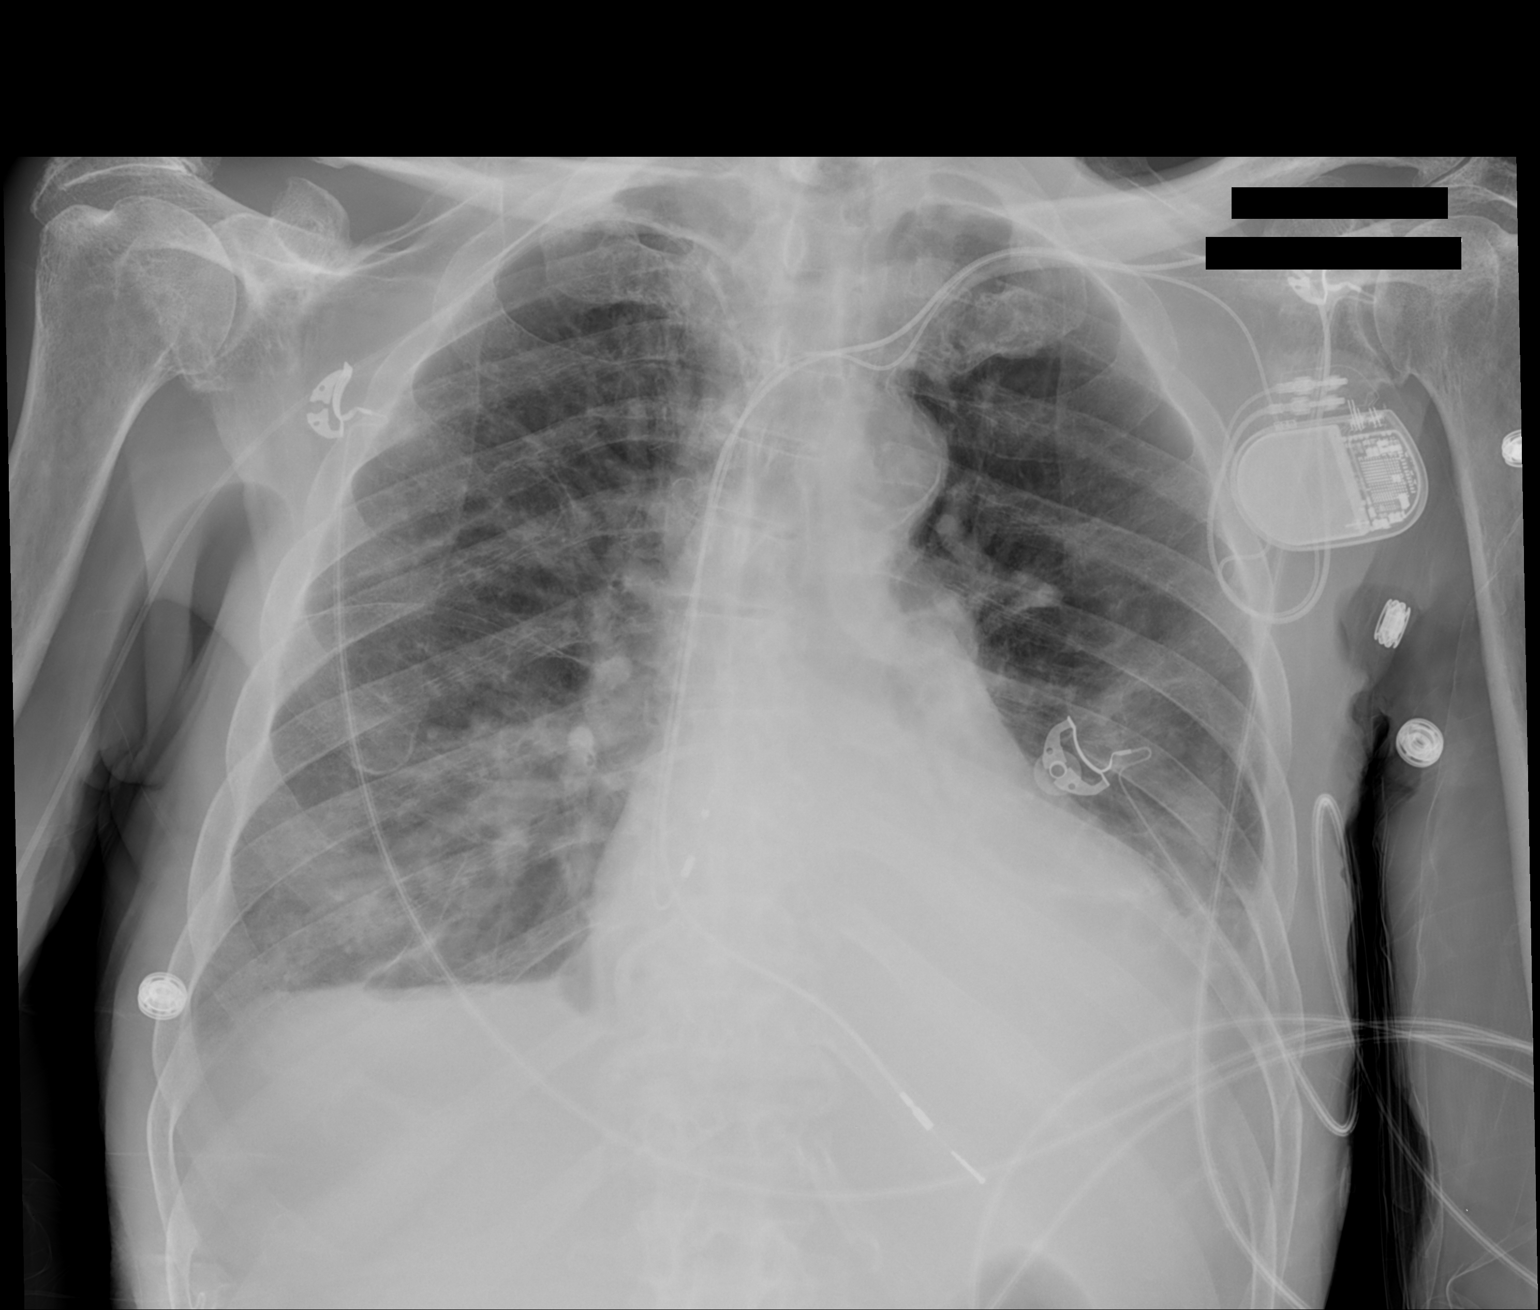

[1 of 1 positions shown; findings below may reference images not displayed]

FINDINGS: The patient has a new right PICC in place. The tip is difficult to
visualize due to patient rotation but it appears to lie in the mid
superior vena cava. Bilateral pleural effusions and basilar airspace
disease persist. There is cardiomegaly. No pneumothorax.
IMPRESSION: Tip of right PICC is difficult to visualize due to patient rotation
but it appears to lie in the mid superior vena cava.

Persistent bilateral effusions and airspace disease.

## 2014-05-19 IMAGING — CR DG KNEE 1-2V*R*
2 series · 2 of 2 positions shown · non-contrast
Comparison: No priors.

CLINICAL DATA: Right knee pain.

EXAM:
RIGHT KNEE - 1-2 VIEW

[t knee ap right]
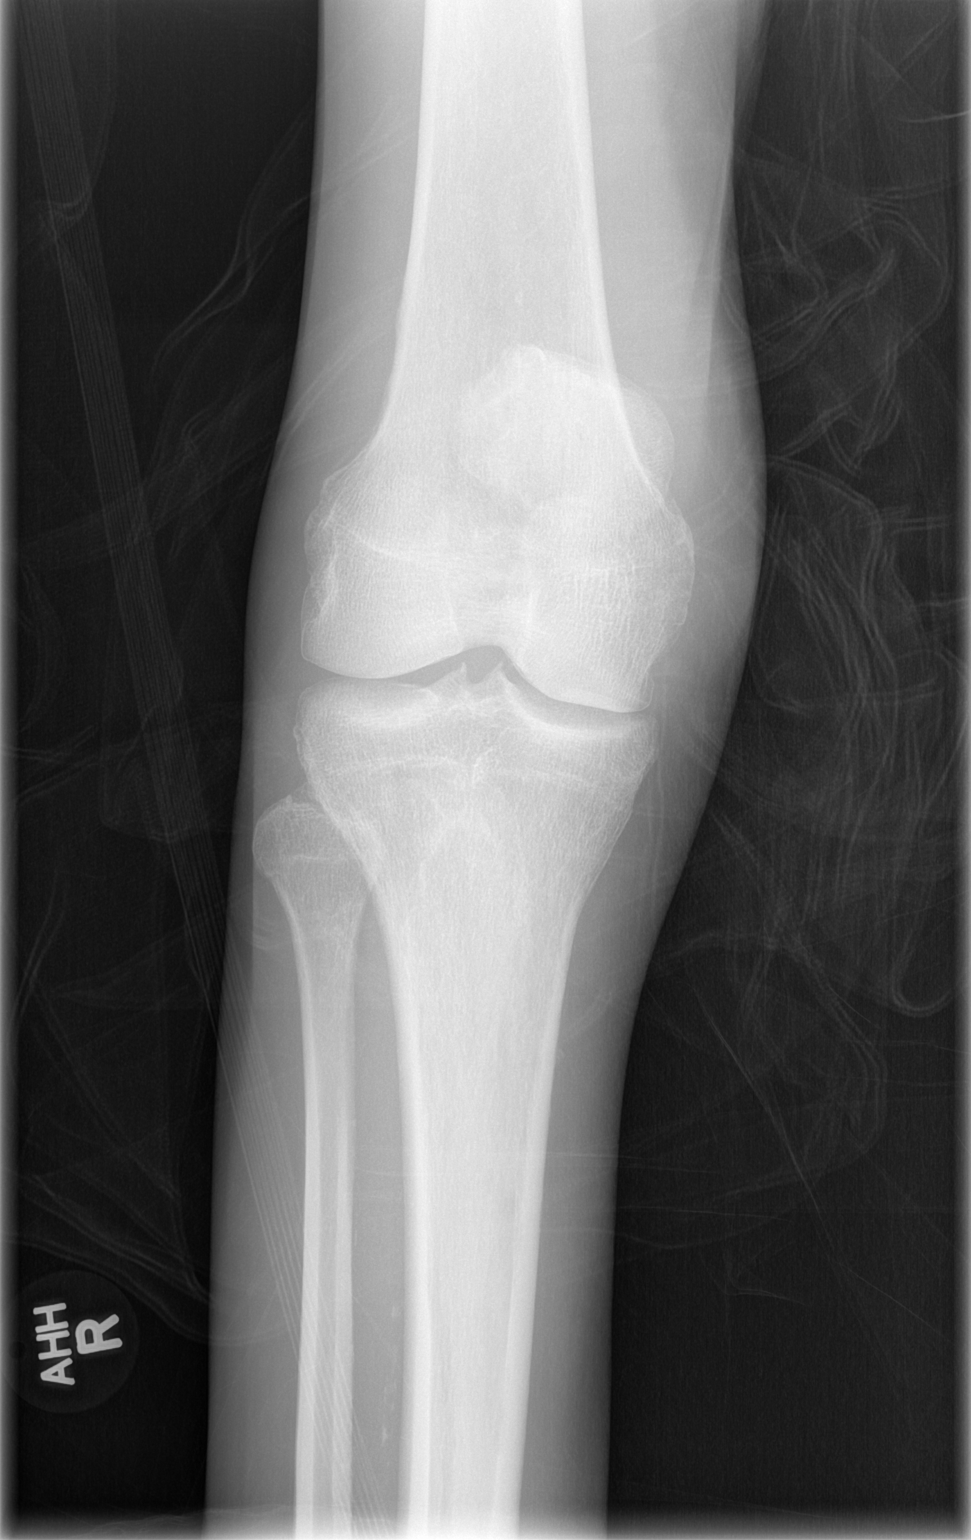

[view not recorded]
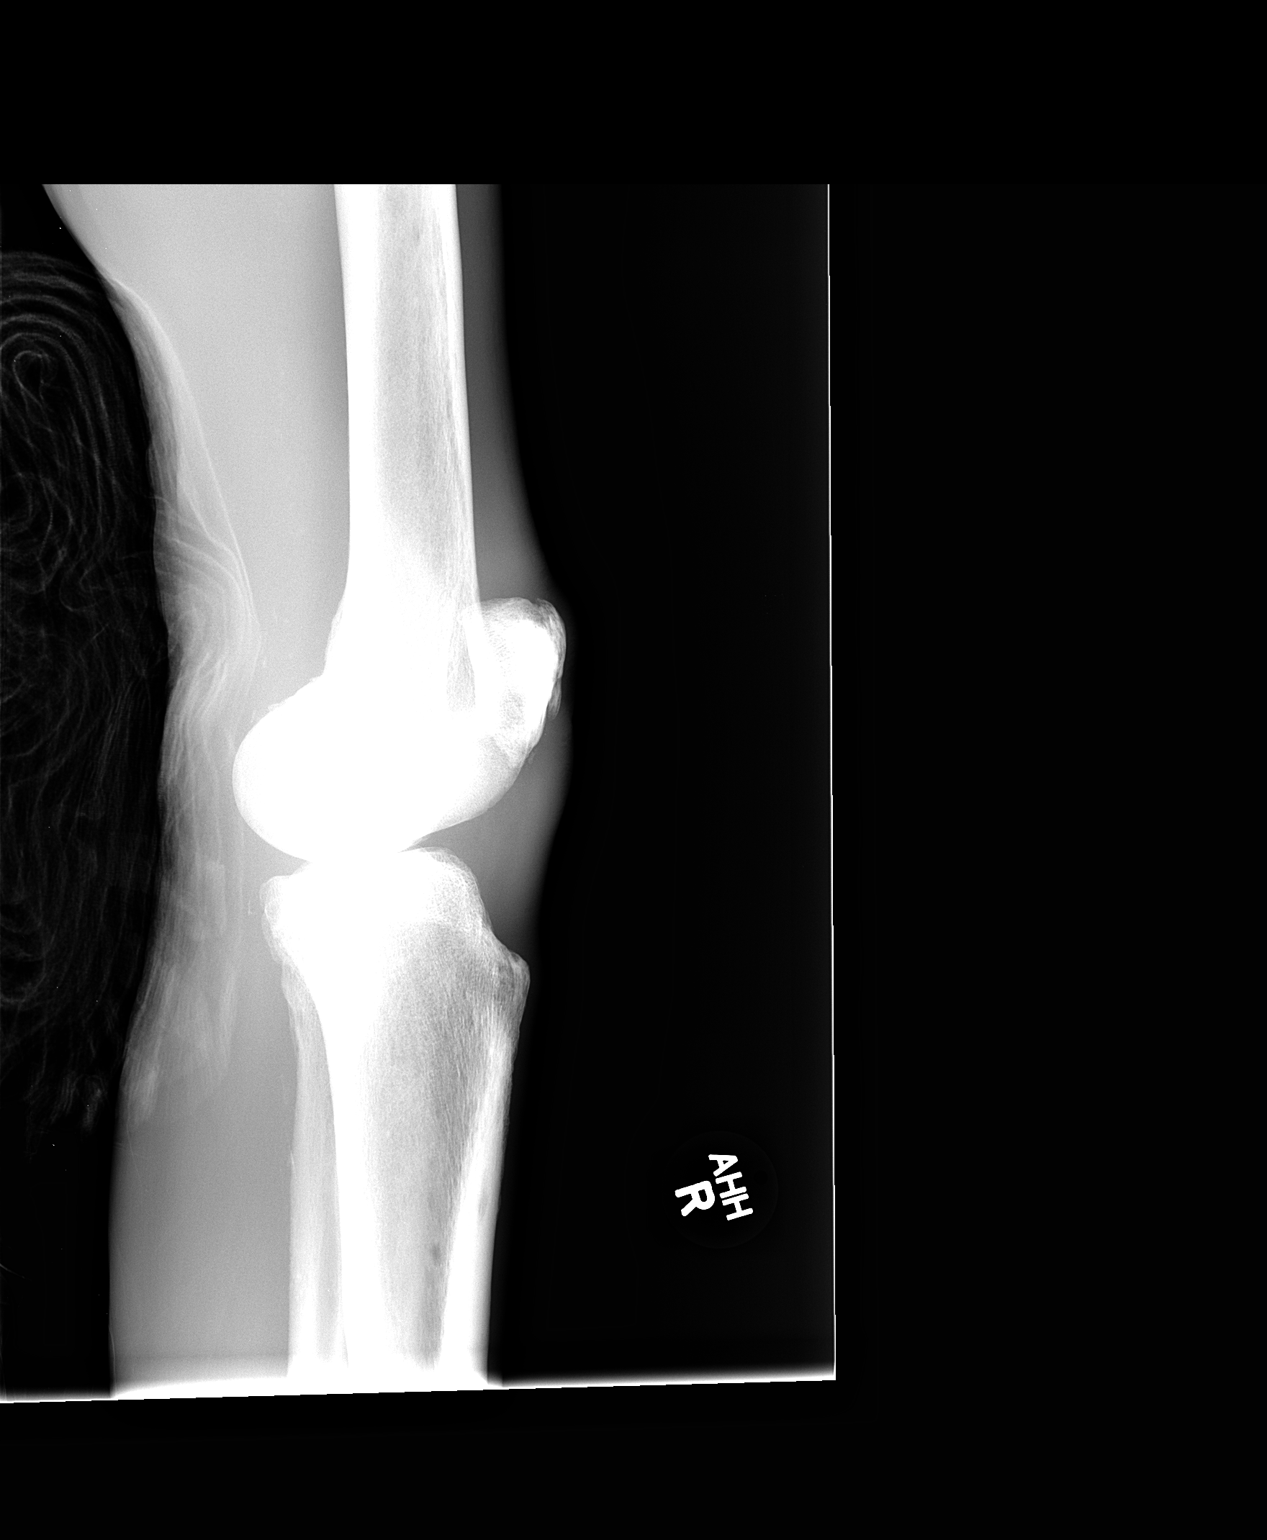

[2 of 2 positions shown; findings below may reference images not displayed]

FINDINGS: There is no evidence of fracture, dislocation, or joint effusion.
There is no evidence of arthropathy or other focal bone abnormality.
Soft tissues are unremarkable.
IMPRESSION: Negative.

## 2014-05-20 IMAGING — CR DG CHEST 1V PORT
1 series · 2 of 2 positions shown · non-contrast
Comparison: 09/19/2013

CLINICAL DATA: Post hydration.

EXAM:
PORTABLE CHEST - 1 VIEW

[Series 1: AP · U · 2 of 2 slices shown]
[im 1/2]
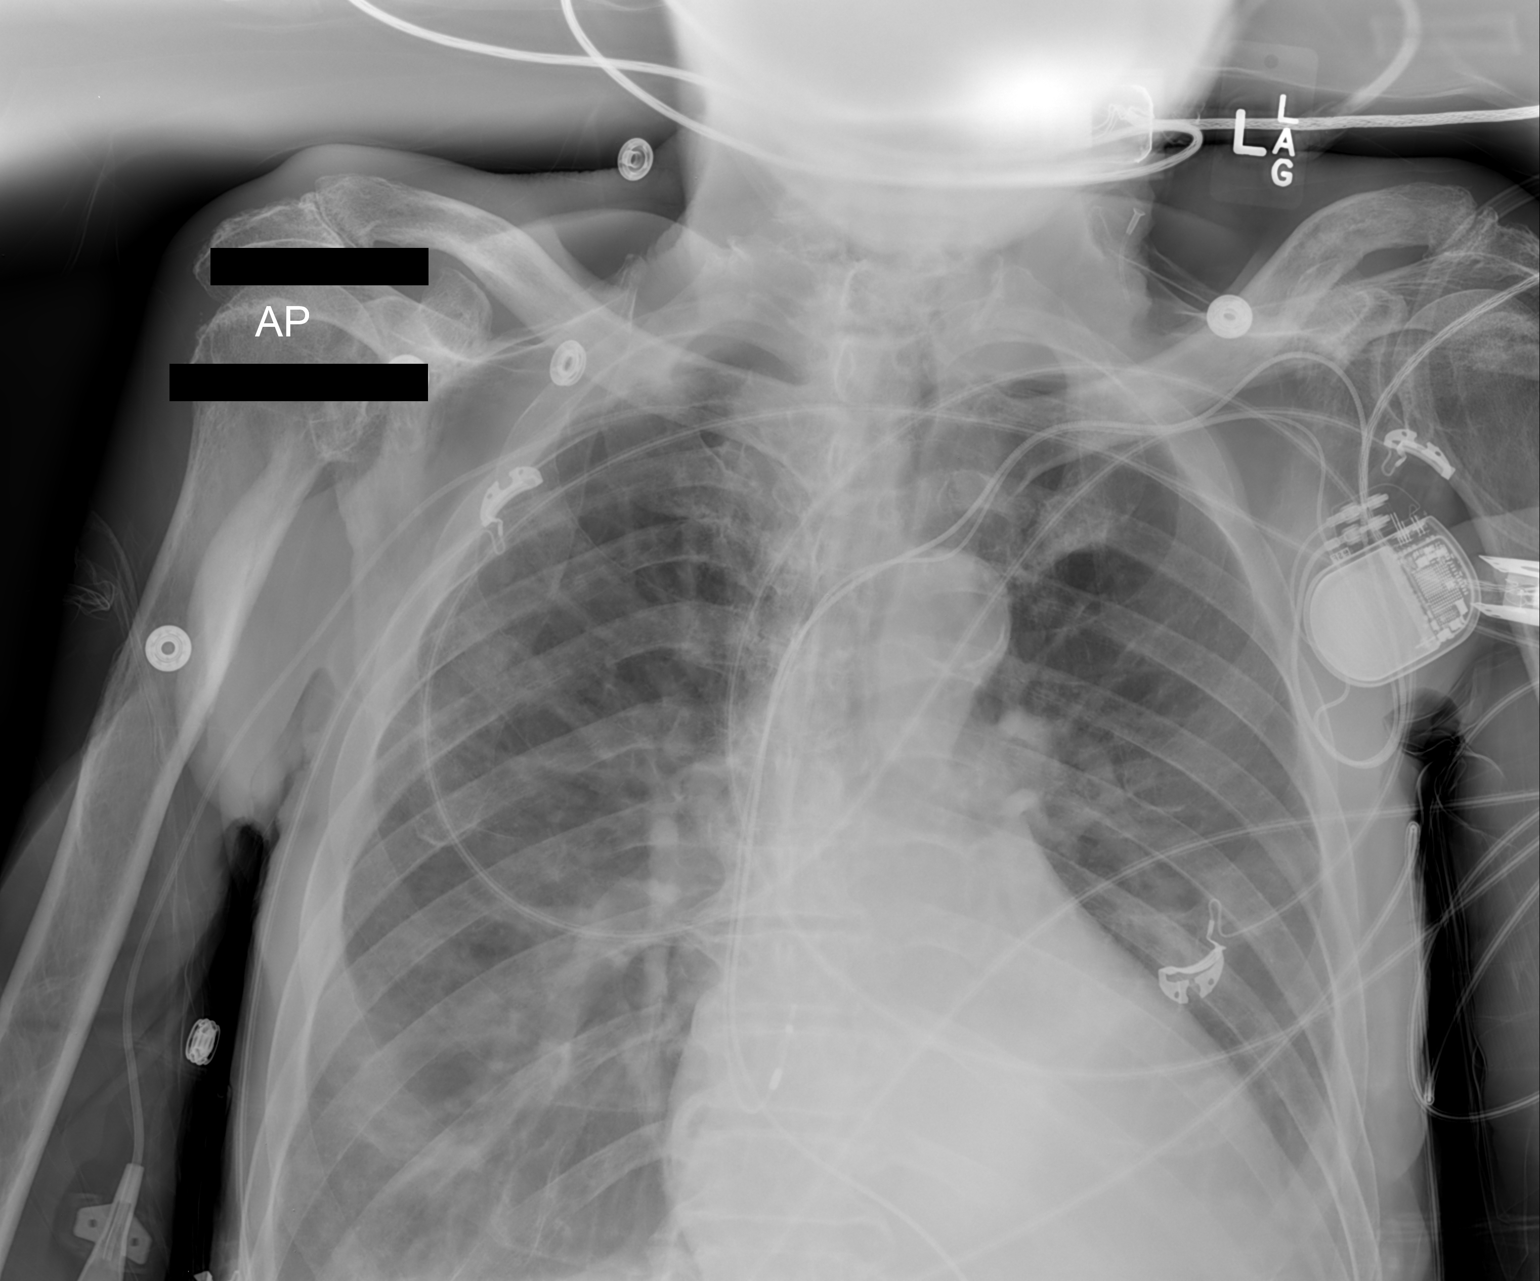
[im 2/2]
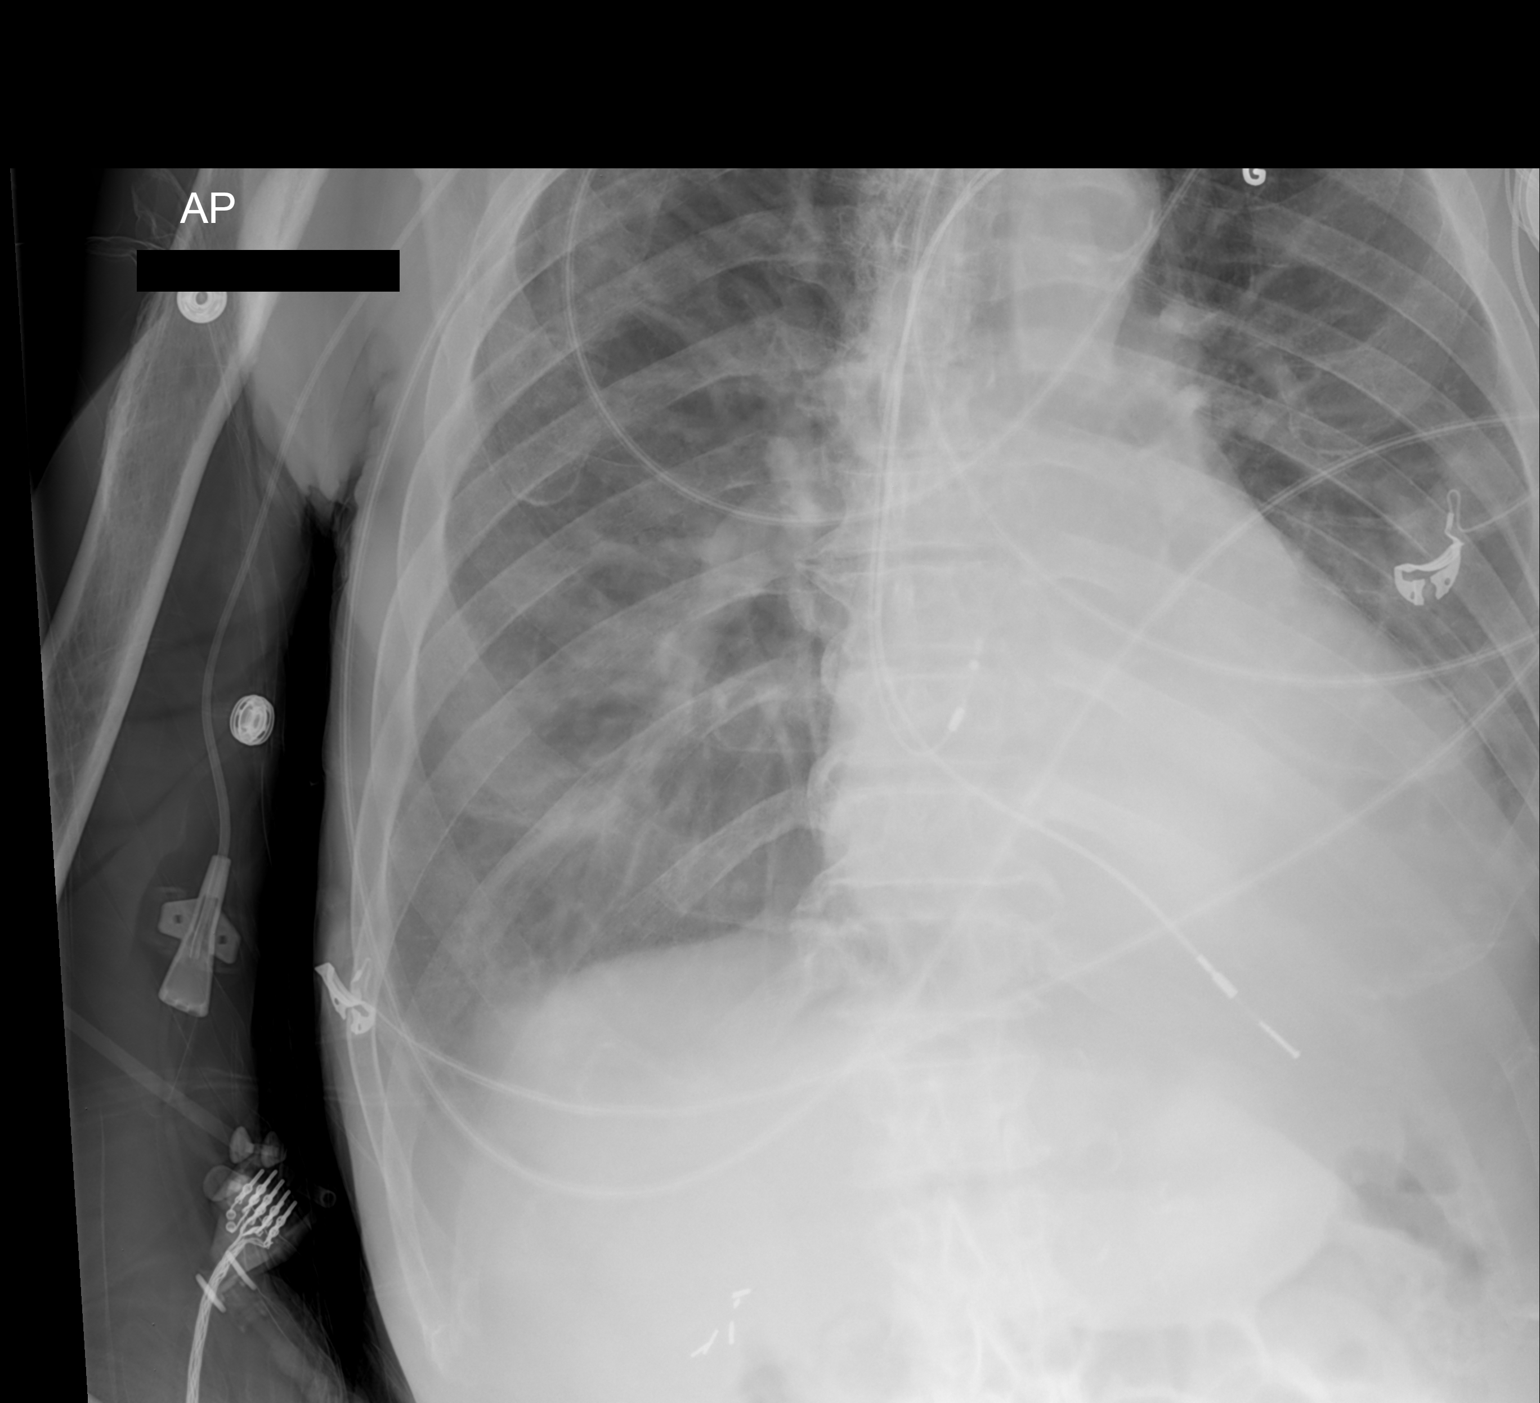

[2 of 2 positions shown; findings below may reference images not displayed]

FINDINGS: Left lower lobe atelectasis or infiltrate again noted, unchanged.
Mild cardiomegaly. Support devices are stable. Suspect small
bilateral effusions.
IMPRESSION: No significant change since prior study.

## 2014-07-14 ENCOUNTER — Encounter (HOSPITAL_COMMUNITY): Payer: Self-pay | Admitting: Cardiovascular Disease
